# Patient Record
Sex: Female | Born: 1970
Health system: Southern US, Community
[De-identification: ages and names within clinical notes are randomized; demographics above are authoritative.]

## PROBLEM LIST (undated history)

## (undated) DIAGNOSIS — F32A Depression, unspecified: Secondary | ICD-10-CM

## (undated) DIAGNOSIS — E785 Hyperlipidemia, unspecified: Secondary | ICD-10-CM

## (undated) DIAGNOSIS — G4730 Sleep apnea, unspecified: Secondary | ICD-10-CM

## (undated) HISTORY — DX: Hyperlipidemia, unspecified: E78.5

## (undated) HISTORY — DX: Depression, unspecified: F32.A

## (undated) HISTORY — PX: GANGLION CYST EXCISION: SHX1691

## (undated) HISTORY — DX: Sleep apnea, unspecified: G47.30

---

## 2012-08-21 ENCOUNTER — Emergency Department (INDEPENDENT_AMBULATORY_CARE_PROVIDER_SITE_OTHER)
Admission: EM | Admit: 2012-08-21 | Discharge: 2012-08-21 | Disposition: A | Discharge status: Home / Self Care | Payer: Self-pay | Source: Home / Self Care | Attending: Family Medicine | Admitting: Family Medicine

## 2012-08-21 ENCOUNTER — Encounter (HOSPITAL_COMMUNITY): Payer: Self-pay | Admitting: Emergency Medicine

## 2012-08-21 DIAGNOSIS — H01113 Allergic dermatitis of right eye, unspecified eyelid: Secondary | ICD-10-CM

## 2012-08-21 DIAGNOSIS — H01119 Allergic dermatitis of unspecified eye, unspecified eyelid: Secondary | ICD-10-CM

## 2012-08-21 MED ORDER — CETIRIZINE HCL 10 MG PO TABS: 10.0000 mg | ORAL_TABLET | Freq: Every day | ORAL | Status: DC

## 2012-08-21 MED ORDER — KETOTIFEN FUMARATE 0.025 % OP SOLN: 1.0000 [drp] | Freq: Two times a day (BID) | OPHTHALMIC | Status: DC

## 2012-08-21 MED ORDER — ERYTHROMYCIN 5 MG/GM OP OINT: TOPICAL_OINTMENT | Freq: Every day | OPHTHALMIC | Status: DC

## 2012-08-21 MED ORDER — TETRACAINE HCL 0.5 % OP SOLN
OPHTHALMIC | Status: AC
  Filled 2012-08-21: qty 2

## 2012-08-21 NOTE — ED Notes (Signed)
Right eye pain, tearing, swelling.  Reports a sty noticed on Sunday.  Used warm compresses.  Thought to be getting better, but then symptoms worsened.  Woke with increased swelling.  Feels like something under upper lid.

## 2012-08-21 NOTE — ED Provider Notes (Signed)
History     CSN: [id number]  Arrival date & time 08/21/12  1017   First MD Initiated Contact with Patient 08/21/12 1027      Chief Complaint  Patient presents with  . Eye Pain    (Consider location/radiation/quality/duration/timing/severity/associated sxs/prior treatment) Patient is a 42 y.o. female presenting with eye pain.  Eye Pain   This is a 42 year old female who presents with swelling of the right upper eyelid which started yesterday. There is no significant pain but she is having a great care at left hearing. She has not noticed any discharge and she does not describe it matting of her eyelids. There are no significant visual changes other than mild blurring due to the excessive tearing. History reviewed. No pertinent past medical history.  Past Surgical History  Procedure Laterality Date  . Cesarean section      No family history on file.  History  Substance Use Topics  . Smoking status: Current Every Day Smoker  . Smokeless tobacco: Not on file  . Alcohol Use: No    OB History   Grav Para Term Preterm Abortions TAB SAB Ect Mult Living                  Review of Systems  Constitutional: Negative.   HENT: Negative.   Eyes: Positive for itching. Negative for photophobia, pain, discharge and redness.       Swelling of the eyelid  Respiratory: Negative.   Cardiovascular: Negative.   Gastrointestinal: Negative.   Genitourinary: Negative.   Musculoskeletal: Negative.   Skin: Negative.   Neurological: Negative.   Hematological: Negative.   Psychiatric/Behavioral: Negative.     Allergies  Review of patient's allergies indicates no known allergies.  Home Medications   Current Outpatient Rx  Name  Route  Sig  Dispense  Refill  . cetirizine (ZYRTEC ALLERGY) 10 MG tablet   Oral   Take 1 tablet (10 mg total) by mouth daily.   30 tablet   0   . erythromycin ophthalmic ointment   Right Eye   Place into the right eye at bedtime.   3.5 g   0   .  ketotifen (ZADITOR) 0.025 % ophthalmic solution   Right Eye   Place 1 drop into the right eye 2 (two) times daily.   5 mL   0     BP 121/89  Pulse 75  Temp(Src) 98.4 F (36.9 C) (Oral)  Resp 16  SpO2 97%  LMP 07/20/2012  Physical Exam  Constitutional: She is oriented to person, place, and time. She appears well-developed and well-nourished.  Morbidly obese  HENT:  Head: Normocephalic and atraumatic.  Eyes: Pupils are equal, round, and reactive to light.  Edema of the right upper eyelid which is translucent. No obvious localized swelling such as a stye noted. No significant erythema or increase in warmth. No discharge noted. There is mild increase in tearing of the eye and mild erythema of her conjunctiva. Extraocular movements are intact without any pain.  Neck: Normal range of motion. Neck supple.  Cardiovascular: Normal rate and regular rhythm.   Pulmonary/Chest: Effort normal and breath sounds normal.  Abdominal: Soft. Bowel sounds are normal.  Musculoskeletal: Normal range of motion.  Neurological: She is alert and oriented to person, place, and time.  Skin: Skin is warm and dry.  Psychiatric: She has a normal mood and affect. Her behavior is normal.    ED Course  Procedures (including critical care time)  Labs Reviewed -  No data to display No results found.   1. Allergic blepharitis, right       MDM  Zatidor drops, Erythromycin ointment at bedtime and PO Zyrtec. If no improvement seen in 1 wk, follow up with ophthalmologist.         Calvert Cantor, MD 08/21/12 1056

## 2012-08-21 NOTE — ED Notes (Signed)
Patient had many questions related to work note and how to get medicines discussed both issues with dr Alfonse Ras.  Provided work note

## 2012-08-21 NOTE — ED Notes (Signed)
Eye box and tetracaine at bedside 

## 2012-08-21 NOTE — ED Notes (Signed)
Patient does not wear contacts or glasses

## 2013-03-03 ENCOUNTER — Other Ambulatory Visit: Payer: Self-pay

## 2013-03-03 DIAGNOSIS — Z1231 Encounter for screening mammogram for malignant neoplasm of breast: Secondary | ICD-10-CM

## 2013-03-28 ENCOUNTER — Ambulatory Visit
Admission: RE | Admit: 2013-03-28 | Discharge: 2013-03-28 | Disposition: A | Discharge status: Home / Self Care | Payer: Medicaid Other | Source: Ambulatory Visit

## 2013-03-28 DIAGNOSIS — Z1231 Encounter for screening mammogram for malignant neoplasm of breast: Secondary | ICD-10-CM

## 2013-06-06 ENCOUNTER — Encounter: Payer: Self-pay | Admitting: Advanced Practice Midwife

## 2013-06-06 ENCOUNTER — Ambulatory Visit (INDEPENDENT_AMBULATORY_CARE_PROVIDER_SITE_OTHER): Payer: Medicaid Other | Admitting: Advanced Practice Midwife

## 2013-06-06 VITALS — BP 106/51 | HR 80 | Ht 67.0 in | Wt 291.0 lb

## 2013-06-06 DIAGNOSIS — IMO0002 Reserved for concepts with insufficient information to code with codable children: Secondary | ICD-10-CM

## 2013-06-06 DIAGNOSIS — Z6841 Body Mass Index (BMI) 40.0 and over, adult: Secondary | ICD-10-CM

## 2013-06-06 DIAGNOSIS — N920 Excessive and frequent menstruation with regular cycle: Secondary | ICD-10-CM | POA: Insufficient documentation

## 2013-06-06 DIAGNOSIS — F17200 Nicotine dependence, unspecified, uncomplicated: Secondary | ICD-10-CM | POA: Insufficient documentation

## 2013-06-06 DIAGNOSIS — Z0000 Encounter for general adult medical examination without abnormal findings: Secondary | ICD-10-CM

## 2013-06-06 LAB — POCT URINALYSIS DIPSTICK
BILIRUBIN UA: NEGATIVE
Glucose, UA: NEGATIVE
Ketones, UA: NEGATIVE
LEUKOCYTES UA: NEGATIVE
NITRITE UA: NEGATIVE
PH UA: 5
PROTEIN UA: NEGATIVE
RBC UA: NEGATIVE
Spec Grav, UA: 1.02
UROBILINOGEN UA: NEGATIVE

## 2013-06-06 NOTE — Progress Notes (Signed)
Subjective:     Diana Garrison Self is a 43 y.o. female here for a routine exam.  Current complaints: pt states that her menstral cycles are heavy with clots.  Pt is interested in talking about ablation.  Pt states that she will have some pain the first couple days of her cycle.   Personal health questionnaire reviewed: yes.  Patient desires treatment for heavy menstrual bleeding. Denies pain.     Gynecologic History No LMP recorded. Contraception: none Last Pap: 2013. Results were: normal Last mammogram: 2015. Results were: normal  Obstetric History OB History  No data available  G3P2102   The following portions of the patient's history were reviewed and updated as appropriate: allergies, current medications, past family history, past medical history, past social history, past surgical history and problem list.  Review of Systems Pertinent items are noted in HPI.    Objective:    BP 106/51  Pulse 80  Ht 5\' 7"  (1.702 m)  Wt 291 lb (131.997 kg)  BMI 45.57 kg/m2  LMP 05/23/2013  General Appearance:    Alert, cooperative, no distress, appears stated age  Head:    Normocephalic, without obvious abnormality, atraumatic  Eyes:    PERRL, conjunctiva/corneas clear, EOM's intact, fundi    benign, both eyes  Ears:    Normal TM's and external ear canals, both ears  Nose:   Nares normal, septum midline, mucosa normal, no drainage    or sinus tenderness  Throat:   Lips, mucosa, and tongue normal; teeth and gums normal  Neck:   Supple, symmetrical, trachea midline, no adenopathy;    thyroid:  no enlargement/tenderness/nodules; no carotid   bruit or JVD  Back:     Symmetric, no curvature, ROM normal, no CVA tenderness  Lungs:     Clear to auscultation bilaterally, respirations unlabored  Chest Wall:    No tenderness or deformity   Heart:    Regular rate and rhythm, S1 and S2 normal, no murmur, rub   or gallop  Breast Exam:    No tenderness, masses, or nipple abnormality  Abdomen:     Soft,  non-tender, bowel sounds active all four quadrants,    no masses, no organomegaly  Genitalia:    Normal female without lesion, discharge or tenderness  Rectal:    Normal tone, normal prostate, no masses or tenderness;   guaiac negative stool  Extremities:   Extremities normal, atraumatic, no cyanosis or edema  Pulses:   2+ and symmetric all extremities  Skin:   Skin color, texture, turgor normal, no rashes or lesions  Lymph nodes:   Cervical, supraclavicular, and axillary nodes normal  Neurologic:   CNII-XII intact, normal strength, sensation and reflexes    throughout      Assessment:    Healthy female exam.  Menorrhagia High BMI Smoker, 43 yo IUD Candidate for heavy menstrual bleeding   Plan:    Education reviewed: calcium supplements, safe sex/STD prevention, self breast exams, smoking cessation and weight bearing exercise. Contraception: abstinence and condoms. Follow up in: 1 month.   Recommend Mirena IUD for symptom management. Educated patient on insertion, risk and benefits. Patient agrees to plan of care. Will reschedule when on her MP for insertion. Encouraged abstinence or condom use prior to insertion. Encouraged patient to stop smoking and pick quit date. Requested IUD handout be given.  40 min spent with patient greater than 80% spent in counseling and coordination of care.  Deontrae Drinkard Wilson SingerWren CNM

## 2013-06-07 LAB — GC/CHLAMYDIA PROBE AMP
CT Probe RNA: NEGATIVE
GC PROBE AMP APTIMA: NEGATIVE

## 2013-06-09 LAB — PAP IG W/ RFLX HPV ASCU

## 2013-08-01 ENCOUNTER — Other Ambulatory Visit: Payer: Self-pay | Admitting: Internal Medicine

## 2013-08-01 DIAGNOSIS — M5430 Sciatica, unspecified side: Secondary | ICD-10-CM

## 2013-08-11 ENCOUNTER — Ambulatory Visit
Admission: RE | Admit: 2013-08-11 | Discharge: 2013-08-11 | Disposition: A | Discharge status: Home / Self Care | Payer: Medicaid Other | Source: Ambulatory Visit | Attending: Internal Medicine | Admitting: Internal Medicine

## 2013-08-11 DIAGNOSIS — M5430 Sciatica, unspecified side: Secondary | ICD-10-CM

## 2013-10-31 ENCOUNTER — Emergency Department (HOSPITAL_COMMUNITY)
Admission: EM | Admit: 2013-10-31 | Discharge: 2013-10-31 | Disposition: A | Discharge status: Home / Self Care | Payer: Medicaid Other | Source: Home / Self Care

## 2013-10-31 ENCOUNTER — Encounter (HOSPITAL_COMMUNITY): Payer: Self-pay | Admitting: Emergency Medicine

## 2013-10-31 DIAGNOSIS — L03221 Cellulitis of neck: Principal | ICD-10-CM

## 2013-10-31 DIAGNOSIS — L0211 Cutaneous abscess of neck: Secondary | ICD-10-CM

## 2013-10-31 MED ORDER — MINOCYCLINE HCL 100 MG PO CAPS: 100.0000 mg | ORAL_CAPSULE | Freq: Two times a day (BID) | ORAL | Status: DC

## 2013-10-31 MED ORDER — MUPIROCIN CALCIUM 2 % EX CREA: 1.0000 "application " | TOPICAL_CREAM | Freq: Three times a day (TID) | CUTANEOUS | Status: DC

## 2013-10-31 NOTE — Discharge Instructions (Signed)
Warm compress twice a day when you take the antibiotic, take all of medicine, return as needed. °

## 2013-10-31 NOTE — ED Notes (Signed)
Pt  Has  Some  Swelling  Redness  And  Tenderness  Below  Her chin      -  She  States  It  Started  Out  As  An ingrown  Hair     About  3  Days  Ago         She pulled the  Hair  And  It  Became  Worse      She  Reports  She  Has  Been applying  Warm compresses to the  Area  And  Symptoms  Are  Worse

## 2013-10-31 NOTE — ED Provider Notes (Signed)
CSN: [id number]     Arrival date & time 10/31/13  1111 History   None    Chief Complaint  Patient presents with  . Abscess   (Consider location/radiation/quality/duration/timing/severity/associated sxs/prior Treatment) Patient is a 43 y.o. female presenting with abscess. The history is provided by the patient.  Abscess Location:  Head/neck Head/neck abscess location:  R neck Size:  1.5 cm Abscess quality: induration, painful and warmth   Red streaking: no   Duration:  3 days Progression:  Worsening Pain details:    Quality:  Pressure   Severity:  Mild Chronicity:  New Context: skin injury   Context comment:  Became worse after pulling chin hair. Associated symptoms: no fever   Risk factors: no prior abscess     No past medical history on file. Past Surgical History  Procedure Laterality Date  . Cesarean section    . Cyst removal hand     History reviewed. No pertinent family history. History  Substance Use Topics  . Smoking status: Current Every Day Smoker  . Smokeless tobacco: Not on file  . Alcohol Use: No   OB History   Grav Para Term Preterm Abortions TAB SAB Ect Mult Living   3 3 2 1  0 0 0 0 0 2     Review of Systems  Constitutional: Negative for fever.  Musculoskeletal: Negative.   Skin: Positive for wound.    Allergies  Review of patient's allergies indicates no known allergies.  Home Medications   Prior to Admission medications   Medication Sig Start Date End Date Taking? Authorizing Provider  cetirizine (ZYRTEC ALLERGY) 10 MG tablet Take 1 tablet (10 mg total) by mouth daily. 08/21/12   Calvert CantorSaima Rizwan, MD  erythromycin ophthalmic ointment Place into the right eye at bedtime. 08/21/12   Calvert CantorSaima Rizwan, MD  ketotifen (ZADITOR) 0.025 % ophthalmic solution Place 1 drop into the right eye 2 (two) times daily. 08/21/12   Calvert CantorSaima Rizwan, MD  minocycline (MINOCIN,DYNACIN) 100 MG capsule Take 1 capsule (100 mg total) by mouth 2 (two) times daily. 10/31/13   Linna HoffJames D  Kindl, MD  mupirocin cream (BACTROBAN) 2 % Apply 1 application topically 3 (three) times daily. 10/31/13   Linna HoffJames D Kindl, MD   BP 122/77  Pulse 89  Temp(Src) 98.1 F (36.7 C) (Oral)  Resp 18  SpO2 99%  LMP 10/18/2013 Physical Exam  Nursing note and vitals reviewed. Constitutional: She is oriented to person, place, and time. She appears well-developed and well-nourished. No distress.  Neurological: She is alert and oriented to person, place, and time.  Skin: Skin is warm and dry. There is erythema.  Indurated area with central crust, no drainage to ant neck., no erythema.    ED Course  Procedures (including critical care time) Labs Review Labs Reviewed - No data to display  Imaging Review No results found.   MDM   1. Cellulitis and abscess of neck        Linna HoffJames D Kindl, MD 11/01/13 1640

## 2014-01-12 ENCOUNTER — Encounter (HOSPITAL_COMMUNITY): Payer: Self-pay | Admitting: Emergency Medicine

## 2014-09-07 ENCOUNTER — Ambulatory Visit: Payer: Medicaid Other | Attending: Internal Medicine

## 2014-10-04 ENCOUNTER — Emergency Department (INDEPENDENT_AMBULATORY_CARE_PROVIDER_SITE_OTHER)
Admission: EM | Admit: 2014-10-04 | Discharge: 2014-10-04 | Disposition: A | Discharge status: Home / Self Care | Payer: Self-pay | Source: Home / Self Care | Attending: Emergency Medicine | Admitting: Emergency Medicine

## 2014-10-04 ENCOUNTER — Encounter (HOSPITAL_COMMUNITY): Payer: Self-pay | Admitting: *Deleted

## 2014-10-04 DIAGNOSIS — K130 Diseases of lips: Secondary | ICD-10-CM

## 2014-10-04 MED ORDER — ACYCLOVIR 400 MG PO TABS: 800.0000 mg | ORAL_TABLET | Freq: Three times a day (TID) | ORAL | Status: DC

## 2014-10-04 NOTE — ED Notes (Signed)
Had faint soreness to inner right upper lip yesterday.  Woke this AM with swelling, redness, and pain to lip.  Tiny lesions noted to lip border.

## 2014-10-04 NOTE — Discharge Instructions (Signed)
This is a bug bite or fever blister. Apply ice as often as you can to help with the swelling. You can use a small amount of over-the-counter hydrocortisone cream to the outer lip twice a day. Take acyclovir 2 tablets 3 times a day for 2 days. With the coupon, this should be between $15 and $20 at [facility] ([place]). Follow-up as needed.

## 2014-10-04 NOTE — ED Provider Notes (Signed)
CSN: [id number]     Arrival date & time 10/04/14  1300 History   First MD Initiated Contact with Patient 10/04/14 1313     Chief Complaint  Patient presents with  . Oral Swelling   (Consider location/radiation/quality/duration/timing/severity/associated sxs/prior Treatment) HPI She is a 44 year old woman here for evaluation of lip swelling. She states that yesterday she had a tingling sensation in her right upper lip. This was after biting her lip. This morning, when she woke up, it was swollen and there was a blister at the [place] border. She denies any tongue swelling. No fevers or chills. No difficulty breathing or swallowing.  She does report a bug bite that looked similar on her hand a few days ago.  History reviewed. No pertinent past medical history. Past Surgical History  Procedure Laterality Date  . Cesarean section    . Ganglion cyst excision      x2   No family history on file. History  Substance Use Topics  . Smoking status: Current Every Day Smoker  . Smokeless tobacco: Not on file  . Alcohol Use: Yes     Comment: occasional   OB History    Gravida Para Term Preterm AB TAB SAB Ectopic Multiple Living   0 0 0 0 0 2     Review of Systems As in history of present illness Allergies  Review of patient's allergies indicates no known allergies.  Home Medications   Prior to Admission medications   Medication Sig Start Date End Date Taking? Authorizing Provider  ACETAMINOPHEN PO Take by mouth. Prn for leg pain   Yes Historical Provider, MD  acyclovir (ZOVIRAX) 400 MG tablet Take 2 tablets (800 mg total) by mouth 3 (three) times daily. For 2 days 10/04/14   Charm Rings, MD   BP 109/54 mmHg  Pulse 96  Temp(Src) 98.5 F (36.9 C) (Oral)  Resp 22  SpO2 99%  LMP 08/24/2014 (Exact Date) Physical Exam  Constitutional: She is oriented to person, place, and time. She appears well-developed and well-nourished. No distress.  HENT:  Mouth/Throat: Oropharynx is  clear and moist and mucous membranes are normal. No oropharyngeal exudate or posterior oropharyngeal edema.    Cardiovascular: Normal rate.   Neurological: She is alert and oriented to person, place, and time.    ED Course  Procedures (including critical care time) Labs Review Labs Reviewed - No data to display  Imaging Review No results found.   MDM   1. Lip lesion    Bug bite versus herpes. Symptomatic treatment with ice and small amount of hydrocortisone cream Prescription for acyclovir given as well. Follow-up as needed.    Charm Rings, MD 10/04/14 [phone]

## 2015-04-26 ENCOUNTER — Other Ambulatory Visit: Payer: Self-pay

## 2015-04-26 DIAGNOSIS — Z1231 Encounter for screening mammogram for malignant neoplasm of breast: Secondary | ICD-10-CM

## 2015-05-06 ENCOUNTER — Ambulatory Visit: Payer: Self-pay

## 2015-06-15 ENCOUNTER — Ambulatory Visit
Admission: RE | Admit: 2015-06-15 | Discharge: 2015-06-15 | Disposition: A | Discharge status: Home / Self Care | Payer: 59 | Source: Ambulatory Visit

## 2015-06-15 DIAGNOSIS — Z1231 Encounter for screening mammogram for malignant neoplasm of breast: Secondary | ICD-10-CM

## 2015-06-17 ENCOUNTER — Other Ambulatory Visit: Payer: Self-pay | Admitting: Family Medicine

## 2015-06-17 ENCOUNTER — Other Ambulatory Visit (HOSPITAL_COMMUNITY)
Admission: RE | Admit: 2015-06-17 | Discharge: 2015-06-17 | Disposition: A | Discharge status: Home / Self Care | Payer: 59 | Source: Ambulatory Visit | Attending: Family Medicine | Admitting: Family Medicine

## 2015-06-17 DIAGNOSIS — Z1151 Encounter for screening for human papillomavirus (HPV): Secondary | ICD-10-CM | POA: Diagnosis present

## 2015-06-17 DIAGNOSIS — Z01419 Encounter for gynecological examination (general) (routine) without abnormal findings: Secondary | ICD-10-CM | POA: Diagnosis present

## 2015-06-23 LAB — CYTOLOGY - PAP

## 2016-04-06 DIAGNOSIS — Z8619 Personal history of other infectious and parasitic diseases: Secondary | ICD-10-CM | POA: Insufficient documentation

## 2016-04-20 ENCOUNTER — Other Ambulatory Visit: Payer: Self-pay | Admitting: Nurse Practitioner

## 2016-04-20 DIAGNOSIS — Z1231 Encounter for screening mammogram for malignant neoplasm of breast: Secondary | ICD-10-CM

## 2016-06-16 ENCOUNTER — Ambulatory Visit: Payer: 59

## 2016-07-26 ENCOUNTER — Ambulatory Visit: Payer: 59

## 2016-09-04 ENCOUNTER — Encounter: Payer: Self-pay | Admitting: Physician Assistant

## 2016-09-04 ENCOUNTER — Ambulatory Visit (INDEPENDENT_AMBULATORY_CARE_PROVIDER_SITE_OTHER): Payer: BLUE CROSS/BLUE SHIELD | Admitting: Physician Assistant

## 2016-09-04 VITALS — BP 127/86 | HR 80 | Temp 98.9°F | Resp 18 | Ht 67.0 in | Wt 275.4 lb

## 2016-09-04 DIAGNOSIS — Z130 Encounter for screening for diseases of the blood and blood-forming organs and certain disorders involving the immune mechanism: Secondary | ICD-10-CM

## 2016-09-04 DIAGNOSIS — Z113 Encounter for screening for infections with a predominantly sexual mode of transmission: Secondary | ICD-10-CM | POA: Diagnosis not present

## 2016-09-04 DIAGNOSIS — Z1159 Encounter for screening for other viral diseases: Secondary | ICD-10-CM

## 2016-09-04 DIAGNOSIS — Z23 Encounter for immunization: Secondary | ICD-10-CM | POA: Diagnosis not present

## 2016-09-04 DIAGNOSIS — Z13228 Encounter for screening for other metabolic disorders: Secondary | ICD-10-CM

## 2016-09-04 DIAGNOSIS — R875 Abnormal microbiological findings in specimens from female genital organs: Secondary | ICD-10-CM | POA: Diagnosis not present

## 2016-09-04 DIAGNOSIS — Z01419 Encounter for gynecological examination (general) (routine) without abnormal findings: Secondary | ICD-10-CM

## 2016-09-04 DIAGNOSIS — A5901 Trichomonal vulvovaginitis: Secondary | ICD-10-CM

## 2016-09-04 DIAGNOSIS — I8390 Asymptomatic varicose veins of unspecified lower extremity: Secondary | ICD-10-CM

## 2016-09-04 DIAGNOSIS — G8929 Other chronic pain: Secondary | ICD-10-CM | POA: Diagnosis not present

## 2016-09-04 DIAGNOSIS — M545 Low back pain, unspecified: Secondary | ICD-10-CM

## 2016-09-04 DIAGNOSIS — Z13220 Encounter for screening for lipoid disorders: Secondary | ICD-10-CM | POA: Diagnosis not present

## 2016-09-04 DIAGNOSIS — Z114 Encounter for screening for human immunodeficiency virus [HIV]: Secondary | ICD-10-CM

## 2016-09-04 DIAGNOSIS — Z0000 Encounter for general adult medical examination without abnormal findings: Secondary | ICD-10-CM

## 2016-09-04 DIAGNOSIS — M549 Dorsalgia, unspecified: Secondary | ICD-10-CM | POA: Insufficient documentation

## 2016-09-04 NOTE — Patient Instructions (Addendum)
I will contact you with your lab results as soon as they are available.   If you have not heard from me in 2 weeks, please contact me.  The fastest way to get your results is to register for My Chart (see the instructions on the last page of this printout).  For your back pain - motrin (ibuprofen or advil ) take 4 - '200mg'$  pills 3x/day or Aleve 2 -- 220,g pills 2x/day but please do not take both.  IF you received an x-ray today, you will receive an invoice from [facility]. Please contact [facility] at [phone] with questions or concerns regarding your invoice.   IF you received labwork today, you will receive an invoice from [facility]. Please contact [facility] at [phone] with questions or concerns regarding your invoice.   Our billing staff will not be able to assist you with questions regarding bills from these companies.  You will be contacted with the lab results as soon as they are available. The fastest way to get your results is to activate your My Chart account. Instructions are located on the last page of this paperwork. If you have not heard from [place] regarding the results in 2 weeks, please contact this office.    Health Maintenance, Female Adopting a healthy lifestyle and getting preventive care can go a long way to promote health and wellness. Talk with your health care provider about what schedule of regular examinations is right for you. This is a good chance for you to check in with your provider about disease prevention and staying healthy. In between checkups, there are plenty of things you can do on your own. Experts have done a lot of research about which lifestyle changes and preventive measures are most likely to keep you healthy. Ask your health care provider for more information. Weight and diet Eat a healthy diet  Be sure to include plenty of vegetables, fruits, low-fat dairy products, and lean protein.  Do not eat a lot of foods high in solid  fats, added sugars, or salt.  Get regular exercise. This is one of the most important things you can do for your health. ? Most adults should exercise for at least 150 minutes each week. The exercise should increase your heart rate and make you sweat (moderate-intensity exercise). ? Most adults should also do strengthening exercises at least twice a week. This is in addition to the moderate-intensity exercise.  Maintain a healthy weight  Body mass index (BMI) is a measurement that can be used to identify possible weight problems. It estimates body fat based on height and weight. Your health care provider can help determine your BMI and help you achieve or maintain a healthy weight.  For females 35 years of age and older: ? A BMI below 18.5 is considered underweight. ? A BMI of 18.5 to 24.9 is normal. ? A BMI of 25 to 29.9 is considered overweight. ? A BMI of 30 and above is considered obese.  Watch levels of cholesterol and blood lipids  You should start having your blood tested for lipids and cholesterol at 46 years of age, then have this test every 5 years.  You may need to have your cholesterol levels checked more often if: ? Your lipid or cholesterol levels are high. ? You are older than 46 years of age. ? You are at high risk for heart disease.  Cancer screening Lung Cancer  Lung cancer screening is recommended for adults 32-24 years old who  are at high risk for lung cancer because of a history of smoking.  A yearly low-dose CT scan of the lungs is recommended for people who: ? Currently smoke. ? Have quit within the past 15 years. ? Have at least a 30-pack-year history of smoking. A pack year is smoking an average of one pack of cigarettes a day for 1 year.  Yearly screening should continue until it has been 15 years since you quit.  Yearly screening should stop if you develop a health problem that would prevent you from having lung cancer treatment.  Breast  Cancer  Practice breast self-awareness. This means understanding how your breasts normally appear and feel.  It also means doing regular breast self-exams. Let your health care provider know about any changes, no matter how small.  If you are in your 20s or 30s, you should have a clinical breast exam (CBE) by a health care provider every 1-3 years as part of a regular health exam.  If you are 77 or older, have a CBE every year. Also consider having a breast X-ray (mammogram) every year.  If you have a family history of breast cancer, talk to your health care provider about genetic screening.  If you are at high risk for breast cancer, talk to your health care provider about having an MRI and a mammogram every year.  Breast cancer gene (BRCA) assessment is recommended for women who have family members with BRCA-related cancers. BRCA-related cancers include: ? Breast. ? Ovarian. ? Tubal. ? Peritoneal cancers.  Results of the assessment will determine the need for genetic counseling and BRCA1 and BRCA2 testing.  Cervical Cancer Your health care provider may recommend that you be screened regularly for cancer of the pelvic organs (ovaries, uterus, and vagina). This screening involves a pelvic examination, including checking for microscopic changes to the surface of your cervix (Pap test). You may be encouraged to have this screening done every 3 years, beginning at age 34.  For women ages 20-65, health care providers may recommend pelvic exams and Pap testing every 3 years, or they may recommend the Pap and pelvic exam, combined with testing for human papilloma virus (HPV), every 5 years. Some types of HPV increase your risk of cervical cancer. Testing for HPV may also be done on women of any age with unclear Pap test results.  Other health care providers may not recommend any screening for nonpregnant women who are considered low risk for pelvic cancer and who do not have symptoms. Ask your  health care provider if a screening pelvic exam is right for you.  If you have had past treatment for cervical cancer or a condition that could lead to cancer, you need Pap tests and screening for cancer for at least 20 years after your treatment. If Pap tests have been discontinued, your risk factors (such as having a new sexual partner) need to be reassessed to determine if screening should resume. Some women have medical problems that increase the chance of getting cervical cancer. In these cases, your health care provider may recommend more frequent screening and Pap tests.  Colorectal Cancer  This type of cancer can be detected and often prevented.  Routine colorectal cancer screening usually begins at 46 years of age and continues through 46 years of age.  Your health care provider may recommend screening at an earlier age if you have risk factors for colon cancer.  Your health care provider may also recommend using home test kits to  check for hidden blood in the stool.  A small camera at the end of a tube can be used to examine your colon directly (sigmoidoscopy or colonoscopy). This is done to check for the earliest forms of colorectal cancer.  Routine screening usually begins at age 89.  Direct examination of the colon should be repeated every 5-10 years through 46 years of age. However, you may need to be screened more often if early forms of precancerous polyps or small growths are found.  Skin Cancer  Check your skin from head to toe regularly.  Tell your health care provider about any new moles or changes in moles, especially if there is a change in a mole's shape or color.  Also tell your health care provider if you have a mole that is larger than the size of a pencil eraser.  Always use sunscreen. Apply sunscreen liberally and repeatedly throughout the day.  Protect yourself by wearing long sleeves, pants, a wide-brimmed hat, and sunglasses whenever you are  outside.  Heart disease, diabetes, and high blood pressure  High blood pressure causes heart disease and increases the risk of stroke. High blood pressure is more likely to develop in: ? People who have blood pressure in the high end of the normal range (130-139/85-89 mm Hg). ? People who are overweight or obese. ? People who are African American.  If you are 2-68 years of age, have your blood pressure checked every 3-5 years. If you are 2 years of age or older, have your blood pressure checked every year. You should have your blood pressure measured twice-once when you are at a hospital or clinic, and once when you are not at a hospital or clinic. Record the average of the two measurements. To check your blood pressure when you are not at a hospital or clinic, you can use: ? An automated blood pressure machine at a pharmacy. ? A home blood pressure monitor.  If you are between 77 years and 24 years old, ask your health care provider if you should take aspirin to prevent strokes.  Have regular diabetes screenings. This involves taking a blood sample to check your fasting blood sugar level. ? If you are at a normal weight and have a low risk for diabetes, have this test once every three years after 46 years of age. ? If you are overweight and have a high risk for diabetes, consider being tested at a younger age or more often. Preventing infection Hepatitis B  If you have a higher risk for hepatitis B, you should be screened for this virus. You are considered at high risk for hepatitis B if: ? You were born in a country where hepatitis B is common. Ask your health care provider which countries are considered high risk. ? Your parents were born in a high-risk country, and you have not been immunized against hepatitis B (hepatitis B vaccine). ? You have HIV or AIDS. ? You use needles to inject street drugs. ? You live with someone who has hepatitis B. ? You have had sex with someone who has  hepatitis B. ? You get hemodialysis treatment. ? You take certain medicines for conditions, including cancer, organ transplantation, and autoimmune conditions.  Hepatitis C  Blood testing is recommended for: ? Everyone born from [birth year] through 1965. ? Anyone with known risk factors for hepatitis C.  Sexually transmitted infections (STIs)  You should be screened for sexually transmitted infections (STIs) including gonorrhea and chlamydia if: ? You  are sexually active and are younger than 46 years of age. ? You are older than 46 years of age and your health care provider tells you that you are at risk for this type of infection. ? Your sexual activity has changed since you were last screened and you are at an increased risk for chlamydia or gonorrhea. Ask your health care provider if you are at risk.  If you do not have HIV, but are at risk, it may be recommended that you take a prescription medicine daily to prevent HIV infection. This is called pre-exposure prophylaxis (PrEP). You are considered at risk if: ? You are sexually active and do not regularly use condoms or know the HIV status of your partner(s). ? You take drugs by injection. ? You are sexually active with a partner who has HIV.  Talk with your health care provider about whether you are at high risk of being infected with HIV. If you choose to begin PrEP, you should first be tested for HIV. You should then be tested every 3 months for as long as you are taking PrEP. Pregnancy  If you are premenopausal and you may become pregnant, ask your health care provider about preconception counseling.  If you may become pregnant, take 400 to 800 micrograms (mcg) of folic acid every day.  If you want to prevent pregnancy, talk to your health care provider about birth control (contraception). Osteoporosis and menopause  Osteoporosis is a disease in which the bones lose minerals and strength with aging. This can result in serious bone  fractures. Your risk for osteoporosis can be identified using a bone density scan.  If you are 26 years of age or older, or if you are at risk for osteoporosis and fractures, ask your health care provider if you should be screened.  Ask your health care provider whether you should take a calcium or vitamin D supplement to lower your risk for osteoporosis.  Menopause may have certain physical symptoms and risks.  Hormone replacement therapy may reduce some of these symptoms and risks. Talk to your health care provider about whether hormone replacement therapy is right for you. Follow these instructions at home:  Schedule regular health, dental, and eye exams.  Stay current with your immunizations.  Do not use any tobacco products including cigarettes, chewing tobacco, or electronic cigarettes.  If you are pregnant, do not drink alcohol.  If you are breastfeeding, limit how much and how often you drink alcohol.  Limit alcohol intake to no more than 1 drink per day for nonpregnant women. One drink equals 12 ounces of beer, 5 ounces of wine, or 1 ounces of hard liquor.  Do not use street drugs.  Do not share needles.  Ask your health care provider for help if you need support or information about quitting drugs.  Tell your health care provider if you often feel depressed.  Tell your health care provider if you have ever been abused or do not feel safe at home. This information is not intended to replace advice given to you by your health care provider. Make sure you discuss any questions you have with your health care provider. Document Released: 09/12/2010 Document Revised: 08/05/2015 Document Reviewed: 12/01/2014 Elsevier Interactive Patient Education  [employer].

## 2016-09-04 NOTE — Progress Notes (Signed)
Diana Garrison  MRN: [id number] DOB: 06-14-1970  PCP: Patient, No Pcp Per  Subjective:  Pt presents to clinic for a CPE. She is doing well but she would like to have STD testing as she was in a relationship where her partner stepped out and she wants to make sure she has not gotten any infections.  Last sexual encounter was about 1 month ago.  She has chronic back pain that she is on gabapentin for - she has never had imaging.  She works as a Quarry manager.  Pts has varicose veins in the past she has seen a specialist who suggested treatment but at the time she was not able to do it.  Her legs ache at night - she has tried compression socks and they have felt better on the nights that she has worn them.  She does not noticed swelling in the evenings.  Last dental exam: last year Last vision exam: a couple months ago Last pap: 2 years ago - normal Last mammo: year ago - all normal  Typical meals for patient: none to 2 meals - sometimes snacks - eats at work Typical beverage choices: water Exercises: none outside of work Sleeps: sleeping well 7-8 hrs per night  Patient Active Problem List   Diagnosis Date Noted  . Back pain 09/04/2016  . Menorrhagia 06/06/2013  . High BMI 06/06/2013  . Smoker 06/06/2013    Review of Systems  Constitutional: Negative.   HENT: Negative.   Eyes: Negative.   Respiratory: Negative.   Cardiovascular: Negative.   Gastrointestinal: Negative.   Endocrine: Negative.   Genitourinary: Positive for menstrual problem (heavy and cramping - typical for her).  Musculoskeletal: Positive for back pain.  Skin: Negative.   Allergic/Immunologic: Negative.   Neurological: Negative.   Hematological: Negative.   Psychiatric/Behavioral: Negative.      Current Outpatient Prescriptions on File Prior to Visit  Medication Sig Dispense Refill  . [DISCONTINUED] cetirizine (ZYRTEC ALLERGY) 10 MG tablet Take 1 tablet (10 mg total) by mouth daily. 30 tablet 0   No current  facility-administered medications on file prior to visit.     No Known Allergies  Social History   Social History  . Marital status: Single    Spouse name: N/A  . Number of children: N/A  . Years of education: N/A   Occupational History  . CNA     [employer]   Social History Main Topics  . Smoking status: Current Every Day Smoker    Packs/day: 0.50    Years: 7.00  . Smokeless tobacco: Never Used  . Alcohol use Yes     Comment: 4 drinks a week  . Drug use: No  . Sexual activity: Yes    Partners: Male    Birth control/ protection: None, Condom     Comment: 10/04/14: Not sexually active x5 mo due to spouse having had a CVA   Other Topics Concern  . None   Social History Narrative   Lives with daughter       Seatbelt - sometimes      Guns in home - no    Past Surgical History:  Procedure Laterality Date  . CESAREAN SECTION    . GANGLION CYST EXCISION     x2    Family History  Problem Relation Age of Onset  . Cirrhosis Father   . Pulmonary embolism Sister      Objective:  BP 127/86   Pulse 80   Temp 98.9 F (  37.2 C) (Oral)   Resp 18   Ht '5\' 7"'$  (1.702 m)   Wt 275 lb 6.4 oz (124.9 kg)   LMP 08/22/2016   SpO2 99%   BMI 43.13 kg/m   Physical Exam  Constitutional: She is oriented to person, place, and time and well-developed, well-nourished, and in no distress.  HENT:  Head: Normocephalic and atraumatic.  Right Ear: Hearing, tympanic membrane, external ear and ear canal normal.  Left Ear: Hearing, tympanic membrane, external ear and ear canal normal.  Nose: Nose normal.  Mouth/Throat: Uvula is midline, oropharynx is clear and moist and mucous membranes are normal.  Eyes: Conjunctivae and EOM are normal. Pupils are equal, round, and reactive to light.  Neck: Trachea normal and normal range of motion. Neck supple. No thyroid mass and no thyromegaly present.  Cardiovascular: Normal rate, regular rhythm and normal heart sounds.   No murmur  heard. Pulmonary/Chest: Effort normal and breath sounds normal. She has no wheezes. Right breast exhibits no inverted nipple, no mass, no nipple discharge, no skin change and no tenderness. Left breast exhibits no inverted nipple, no mass, no nipple discharge, no skin change and no tenderness. Breasts are symmetrical.  Abdominal: Soft. Bowel sounds are normal. There is no tenderness.  Genitourinary: Vagina normal, uterus normal, cervix normal, right adnexa normal, left adnexa normal and vulva normal.  Genitourinary Comments: Difficult bimanual exam due to size  Musculoskeletal: Normal range of motion.  Bilateral varicose veins lower legs - worse on the left  Lymphadenopathy:    She has no cervical adenopathy.  Neurological: She is alert and oriented to person, place, and time. She has normal motor skills, normal sensation, normal strength and normal reflexes. Gait normal.  Skin: Skin is warm and dry.  Psychiatric: Mood, memory, affect and judgment normal.    Wt Readings from Last 3 Encounters:  09/04/16 275 lb 6.4 oz (124.9 kg)  06/06/13 291 lb (132 kg)     Visual Acuity Screening   Right eye Left eye Both eyes  Without correction: '20/20 20/20 20/20 '$  With correction:       Assessment and Plan :  Annual physical exam - anticipatory guidance  Chronic low back pain without sciatica, unspecified back pain laterality - this is long standing and patient will RTC for further evaluation  Screening, lipid - Plan: Lipid panel - check labs  Screening for metabolic disorder - Plan: CMP14+EGFR - check labs  Screening for deficiency anemia - Plan: CBC with Differential/Platelet - check labs  Screening for HIV (human immunodeficiency virus) - Plan: HIV antibody  Need for hepatitis B screening test - Plan: Hepatitis B surface antigen  Need for hepatitis C screening test - Plan: HCV Antibody RFX to Quant PCR  Screen for STD (sexually transmitted disease) - Plan: Trichomonas vaginalis, RNA,  RPR  Encounter for gynecological examination without abnormal finding - Plan: Pap IG, CT/NG NAA, and HPV (high risk)  Varicose vein of leg - Plan: Ambulatory referral to Vascular Surgery - encouraged pt to use support socks which she has used in the past.  Need for Tdap vaccination - Plan: Tdap vaccine greater than or equal to 7yo IM  Windell Hummingbird PA-C  [facility] 09/05/2016 8:01 AM

## 2016-09-05 LAB — CBC WITH DIFFERENTIAL/PLATELET
BASOS ABS: 0 10*3/uL (ref 0.0–0.2)
Basos: 0 %
EOS (ABSOLUTE): 0.2 10*3/uL (ref 0.0–0.4)
Eos: 2 %
Hematocrit: 35.5 % (ref 34.0–46.6)
Hemoglobin: 11 g/dL — ABNORMAL LOW (ref 11.1–15.9)
IMMATURE GRANS (ABS): 0 10*3/uL (ref 0.0–0.1)
Immature Granulocytes: 0 %
LYMPHS: 24 %
Lymphocytes Absolute: 2.6 10*3/uL (ref 0.7–3.1)
MCH: 27.6 pg (ref 26.6–33.0)
MCHC: 31 g/dL — AB (ref 31.5–35.7)
MCV: 89 fL (ref 79–97)
Monocytes Absolute: 0.8 10*3/uL (ref 0.1–0.9)
Monocytes: 7 %
Neutrophils Absolute: 7.1 10*3/uL — ABNORMAL HIGH (ref 1.4–7.0)
Neutrophils: 67 %
PLATELETS: 451 10*3/uL — AB (ref 150–379)
RBC: 3.98 x10E6/uL (ref 3.77–5.28)
RDW: 17.9 % — ABNORMAL HIGH (ref 12.3–15.4)
WBC: 10.7 10*3/uL (ref 3.4–10.8)

## 2016-09-05 LAB — HCV AB W REFLEX TO QUANT PCR: HCV AB: 0.1 {s_co_ratio} (ref 0.0–0.9)

## 2016-09-05 LAB — CMP14+EGFR
A/G RATIO: 1.7 (ref 1.2–2.2)
ALK PHOS: 78 IU/L (ref 39–117)
ALT: 12 IU/L (ref 0–32)
AST: 16 IU/L (ref 0–40)
Albumin: 3.9 g/dL (ref 3.5–5.5)
BILIRUBIN TOTAL: 0.2 mg/dL (ref 0.0–1.2)
BUN/Creatinine Ratio: 14 (ref 9–23)
BUN: 10 mg/dL (ref 6–24)
CHLORIDE: 105 mmol/L (ref 96–106)
CO2: 21 mmol/L (ref 20–29)
Calcium: 9.2 mg/dL (ref 8.7–10.2)
Creatinine, Ser: 0.7 mg/dL (ref 0.57–1.00)
GFR calc Af Amer: 121 mL/min/{1.73_m2} (ref >59)
GFR calc non Af Amer: 105 mL/min/{1.73_m2} (ref >59)
GLUCOSE: 92 mg/dL (ref 65–99)
Globulin, Total: 2.3 g/dL (ref 1.5–4.5)
POTASSIUM: 4.4 mmol/L (ref 3.5–5.2)
Sodium: 137 mmol/L (ref 134–144)
Total Protein: 6.2 g/dL (ref 6.0–8.5)

## 2016-09-05 LAB — LIPID PANEL
CHOL/HDL RATIO: 5.5 ratio — AB (ref 0.0–4.4)
Cholesterol, Total: 219 mg/dL — ABNORMAL HIGH (ref 100–199)
HDL: 40 mg/dL (ref >39)
LDL CALC: 145 mg/dL — AB (ref 0–99)
Triglycerides: 170 mg/dL — ABNORMAL HIGH (ref 0–149)
VLDL CHOLESTEROL CAL: 34 mg/dL (ref 5–40)

## 2016-09-05 LAB — HCV INTERPRETATION

## 2016-09-05 LAB — HIV ANTIBODY (ROUTINE TESTING W REFLEX): HIV SCREEN 4TH GENERATION: NONREACTIVE

## 2016-09-05 LAB — RPR: RPR Ser Ql: NONREACTIVE

## 2016-09-05 LAB — HEPATITIS B SURFACE ANTIGEN: Hepatitis B Surface Ag: NEGATIVE

## 2016-09-08 LAB — TRICHOMONAS VAGINALIS, PROBE AMP: TRICH VAG BY NAA: POSITIVE — AB

## 2016-09-11 ENCOUNTER — Encounter: Payer: Self-pay | Admitting: Physician Assistant

## 2016-09-11 LAB — PAP IG, CT-NG NAA, HPV HIGH-RISK
Chlamydia, Nuc. Acid Amp: NEGATIVE
Gonococcus by Nucleic Acid Amp: NEGATIVE
HPV, HIGH-RISK: NEGATIVE
PAP Smear Comment: 0

## 2016-09-12 MED ORDER — METRONIDAZOLE 500 MG PO TABS: 2000.0000 mg | ORAL_TABLET | Freq: Once | ORAL | 0 refills | Status: AC

## 2016-09-12 NOTE — Addendum Note (Signed)
Addended by: Morrell RiddleWEBER, SARAH L on: 09/12/2016 05:49 AM   Modules accepted: Orders

## 2016-09-13 ENCOUNTER — Encounter: Payer: Self-pay | Admitting: Physician Assistant

## 2016-09-14 ENCOUNTER — Encounter: Payer: Self-pay | Admitting: Physician Assistant

## 2016-09-15 ENCOUNTER — Ambulatory Visit
Admission: RE | Admit: 2016-09-15 | Discharge: 2016-09-15 | Disposition: A | Discharge status: Home / Self Care | Payer: BLUE CROSS/BLUE SHIELD | Source: Ambulatory Visit | Attending: Nurse Practitioner | Admitting: Nurse Practitioner

## 2016-09-15 ENCOUNTER — Other Ambulatory Visit: Payer: Self-pay | Admitting: Physician Assistant

## 2016-09-15 DIAGNOSIS — Z1231 Encounter for screening mammogram for malignant neoplasm of breast: Secondary | ICD-10-CM

## 2016-09-15 MED ORDER — FISH OIL 1000 MG PO CAPS: 1000.0000 mg | ORAL_CAPSULE | Freq: Every day | ORAL | 4 refills | Status: DC

## 2016-09-21 ENCOUNTER — Other Ambulatory Visit: Payer: Self-pay

## 2016-09-21 DIAGNOSIS — I83893 Varicose veins of bilateral lower extremities with other complications: Secondary | ICD-10-CM

## 2016-09-27 ENCOUNTER — Encounter: Payer: Self-pay | Admitting: Physician Assistant

## 2016-09-27 ENCOUNTER — Ambulatory Visit (INDEPENDENT_AMBULATORY_CARE_PROVIDER_SITE_OTHER): Payer: BLUE CROSS/BLUE SHIELD | Admitting: Physician Assistant

## 2016-09-27 VITALS — BP 123/82 | HR 81 | Temp 98.9°F | Resp 18 | Ht 67.0 in | Wt 276.8 lb

## 2016-09-27 DIAGNOSIS — B373 Candidiasis of vulva and vagina: Secondary | ICD-10-CM

## 2016-09-27 DIAGNOSIS — B3731 Acute candidiasis of vulva and vagina: Secondary | ICD-10-CM

## 2016-09-27 DIAGNOSIS — A5901 Trichomonal vulvovaginitis: Secondary | ICD-10-CM

## 2016-09-27 DIAGNOSIS — R52 Pain, unspecified: Secondary | ICD-10-CM | POA: Diagnosis not present

## 2016-09-27 DIAGNOSIS — L739 Follicular disorder, unspecified: Secondary | ICD-10-CM | POA: Diagnosis not present

## 2016-09-27 LAB — POCT WET + KOH PREP

## 2016-09-27 MED ORDER — IBUPROFEN 800 MG PO TABS: 800.0000 mg | ORAL_TABLET | Freq: Three times a day (TID) | ORAL | 0 refills | Status: DC | PRN

## 2016-09-27 MED ORDER — CEPHALEXIN 500 MG PO CAPS: 500.0000 mg | ORAL_CAPSULE | Freq: Three times a day (TID) | ORAL | 0 refills | Status: DC

## 2016-09-27 MED ORDER — METRONIDAZOLE 500 MG PO TABS: 500.0000 mg | ORAL_TABLET | Freq: Two times a day (BID) | ORAL | 0 refills | Status: AC

## 2016-09-27 NOTE — Progress Notes (Signed)
Diana Garrison  MRN: [id number] DOB: Jul 06, 1970  PCP: Morrell RiddleWeber, Lyann Hagstrom L, PA-C  Chief Complaint  Patient presents with  . Follow-up    recheck from labs last time     Subjective:  Pt presents to clinic for recheck to make sure her trich is gone.  She has no symptoms and she took the medication without problems and she has not had sex again with that partner.  She is also still having lots of leg aching that is helped with the motrin - she has the appt with vascular specialist next month - legs only hurt after she has been standing for long periods of time.  On her days off no problems.  No joint pain just mainly her lower legs. She also has a lesion on her groin - she has had something like this in the past but wants to have it checked out.  She has done nothing for the area.  History is obtained by patient.  Review of Systems  Genitourinary: Negative for vaginal discharge.  Musculoskeletal: Positive for myalgias.    Patient Active Problem List   Diagnosis Date Noted  . Back pain 09/04/2016  . Menorrhagia 06/06/2013  . High BMI 06/06/2013  . Smoker 06/06/2013    Current Outpatient Prescriptions on File Prior to Visit  Medication Sig Dispense Refill  . gabapentin (NEURONTIN) 100 MG capsule TAKE 1 TO 3 CAPSULES BY MOUTH ONCE DAILY AS NEEDED FOR BACK PAIN    . Omega-3 Fatty Acids (FISH OIL) 1000 MG CAPS Take 1 capsule (1,000 mg total) by mouth daily. 90 capsule 4  . [DISCONTINUED] cetirizine (ZYRTEC ALLERGY) 10 MG tablet Take 1 tablet (10 mg total) by mouth daily. 30 tablet 0   No current facility-administered medications on file prior to visit.     No Known Allergies  No past medical history on file. Social History   Social History Narrative   Lives with daughter       Seatbelt - sometimes      Guns in home - no   Social History  Substance Use Topics  . Smoking status: Current Every Day Smoker    Packs/day: 0.50    Years: 7.00  . Smokeless tobacco: Never Used  .  Alcohol use Yes     Comment: 4 drinks a week   family history includes Cirrhosis in her father; Pulmonary embolism in her sister.     Objective:  BP 123/82   Pulse 81   Temp 98.9 F (37.2 C) (Oral)   Resp 18   Ht 5\' 7"  (1.702 m)   Wt 276 lb 12.8 oz (125.6 kg)   LMP 09/19/2016   SpO2 99%   BMI 43.35 kg/m  Body mass index is 43.35 kg/m.  Physical Exam  Constitutional: She is oriented to person, place, and time and well-developed, well-nourished, and in no distress.  HENT:  Head: Normocephalic and atraumatic.  Right Ear: Hearing and external ear normal.  Left Ear: Hearing and external ear normal.  Eyes: Conjunctivae are normal.  Neck: Normal range of motion.  Cardiovascular: Normal rate, regular rhythm and normal heart sounds.   No murmur heard. Pulmonary/Chest: Effort normal and breath sounds normal. She has no wheezes.  Genitourinary: Vagina normal, uterus normal, cervix normal, right adnexa normal, left adnexa normal and vulva normal.  Neurological: She is alert and oriented to person, place, and time. Gait normal.  Skin: Skin is warm and dry. Rash noted. Rash is pustular (right buttocks/groin area).  Varicosities  bilateral lower legs  Psychiatric: Mood, memory, affect and judgment normal.  Vitals reviewed.   Results for orders placed or performed in visit on 09/27/16  POCT Wet + KOH Prep  Result Value Ref Range   Yeast by KOH Present (A) Absent   Yeast by wet prep Present (A) Absent   WBC by wet prep Few Few   Clue Cells Wet Prep HPF POC Few (A) None   Trich by wet prep Present (A) Absent   Bacteria Wet Prep HPF POC Many (A) Few   Epithelial Cells By [employer] ([facility]) Moderate (A) None, Few, Too numerous to count   RBC,UR,HPF,POC None None RBC/hpf    Assessment and Plan :  Vaginal trichomoniasis - Plan: POCT Wet + KOH Prep, metroNIDAZOLE (FLAGYL) 500 MG tablet - treat again but for longer  Aching pain - Plan: ibuprofen (ADVIL,MOTRIN) 800 MG tablet - likely  related to varicosities - she will start with compression socks and elevation while waiting on her vascular appt  Folliculitis - Plan: cephALEXin (KEFLEX) 500 MG capsule - warm compresses  Yeast vaginitis - Plan: fluconazole (DIFLUCAN) 150 MG tablet - treat after abx are finished.  Benny Lennert PA-C  [facility] at [facility] 10/03/2016 12:13 PM

## 2016-09-27 NOTE — Patient Instructions (Signed)
     IF you received an x-ray today, you will receive an invoice from [facility]. Please contact [facility] at [phone] with questions or concerns regarding your invoice.   IF you received labwork today, you will receive an invoice from [facility]. Please contact [facility] at [phone] with questions or concerns regarding your invoice.   Our billing staff will not be able to assist you with questions regarding bills from these companies.  You will be contacted with the lab results as soon as they are available. The fastest way to get your results is to activate your My Chart account. Instructions are located on the last page of this paperwork. If you have not heard from us regarding the results in 2 weeks, please contact this office.     

## 2016-10-02 ENCOUNTER — Encounter: Payer: Self-pay | Admitting: Physician Assistant

## 2016-10-03 ENCOUNTER — Encounter: Payer: Self-pay | Admitting: Physician Assistant

## 2016-10-03 ENCOUNTER — Other Ambulatory Visit: Payer: Self-pay | Admitting: Physician Assistant

## 2016-10-03 MED ORDER — FLUCONAZOLE 150 MG PO TABS: 150.0000 mg | ORAL_TABLET | Freq: Once | ORAL | 0 refills | Status: AC

## 2016-10-03 NOTE — Progress Notes (Signed)
Please let the patient know she also had a yeast infetion and I sent in diflucan that needs to be used after the abx are finished.

## 2016-10-05 ENCOUNTER — Telehealth: Payer: Self-pay

## 2016-10-05 NOTE — Progress Notes (Signed)
Called pt and left VM and asked Pt to call back because pt doesn't have a number checked off to leave a detailed message.

## 2016-10-05 NOTE — Telephone Encounter (Signed)
Called pt and left VM and asked Pt to call back because pt doesn't have a number checked off to leave a detailed VM.

## 2016-10-05 NOTE — Telephone Encounter (Signed)
Spoke with pt and confirmed that she picked up her meds and to start the diflucan after finishing abx

## 2016-10-16 ENCOUNTER — Encounter: Payer: Self-pay | Admitting: Vascular Surgery

## 2016-10-17 ENCOUNTER — Ambulatory Visit (INDEPENDENT_AMBULATORY_CARE_PROVIDER_SITE_OTHER): Payer: BLUE CROSS/BLUE SHIELD | Admitting: Vascular Surgery

## 2016-10-17 ENCOUNTER — Encounter: Payer: Self-pay | Admitting: Vascular Surgery

## 2016-10-17 VITALS — BP 122/85 | HR 72 | Temp 98.5°F | Resp 18 | Ht 66.5 in | Wt 275.2 lb

## 2016-10-17 DIAGNOSIS — I8393 Asymptomatic varicose veins of bilateral lower extremities: Secondary | ICD-10-CM

## 2016-10-17 NOTE — Progress Notes (Signed)
Subjective:     Patient ID: Diana Garrison, female   DOB: 1970/12/15, 46 y.o.   MRN: [id number]  HPI This 45 year old female was referred by Ricka Burdock PA for evaluation of varicose veins in both lower extremities with pain. Patient states she has had broken veins in both legs 20 years which have progressively gotten worse. She has aching discomfort throughout both legs all day. She is on her feet a lot with her occupation. She has no history of DVT thrombophlebitis stasis ulcers or bleeding. She will occasionally wear "support stockings" below the knee which do improve her symptoms. She was evaluated at [facility] in March 2018 and recommendation for "injections" was made. She did have an ultrasound that she does not have that with her today.  History reviewed. No pertinent past medical history.  Social History  Substance Use Topics  . Smoking status: Current Every Day Smoker    Packs/day: 0.50    Years: 7.00  . Smokeless tobacco: Never Used  . Alcohol use Yes     Comment: 4 drinks a week    Family History  Problem Relation Age of Onset  . Cirrhosis Father   . Pulmonary embolism Sister     No Known Allergies   Current Outpatient Prescriptions:  .  ibuprofen (ADVIL,MOTRIN) 800 MG tablet, Take 1 tablet (800 mg total) by mouth every 8 (eight) hours as needed., Disp: 90 tablet, Rfl: 0 .  Multiple Vitamin (MULTIVITAMIN) tablet, Take 1 tablet by mouth daily., Disp: , Rfl:  .  Omega-3 Fatty Acids (FISH OIL) 1000 MG CAPS, Take 1 capsule (1,000 mg total) by mouth daily., Disp: 90 capsule, Rfl: 4 .  cephALEXin (KEFLEX) 500 MG capsule, Take 1 capsule (500 mg total) by mouth 3 (three) times daily. (Patient not taking: Reported on 10/17/2016), Disp: 30 capsule, Rfl: 0 .  gabapentin (NEURONTIN) 100 MG capsule, TAKE 1 TO 3 CAPSULES BY MOUTH ONCE DAILY AS NEEDED FOR BACK PAIN, Disp: , Rfl:   Vitals:   10/17/16 0905  BP: 122/85  Pulse: 72  Resp: 18  Temp: 98.5 F (36.9 C)  TempSrc: Oral   SpO2: 99%  Weight: 275 lb 3.2 oz (124.8 kg)  Height: 5' 6.5" (1.689 m)    Body mass index is 43.75 kg/m.         Review of Systems Patient has morbid obesity. Denies chest pain, dyspnea on exertion, PND, orthopnea, hemoptysis.    Objective:   Physical Exam BP 122/85 (BP Location: Left Arm, Patient Position: Sitting, Cuff Size: Large)   Pulse 72   Temp 98.5 F (36.9 C) (Oral)   Resp 18   Ht 5' 6.5" (1.689 m)   Wt 275 lb 3.2 oz (124.8 kg)   LMP 09/19/2016   SpO2 99%   BMI 43.75 kg/m     Gen.-alert and oriented x3 in no apparent distress-morbidly obese HEENT normal for age Lungs no rhonchi or wheezing Cardiovascular regular rhythm no murmurs carotid pulses 3+ palpable no bruits audible Abdomen soft nontender no palpable masses Musculoskeletal free of  major deformities Skin clear -no rashes Neurologic normal Lower extremities 3+ femoral and dorsalis pedis pulses palpable bilaterally with no edema Diffuse bilateral spider and reticular veins particularly in the medial and lateral thighs and medial and lateral calf areas. No hyperpigmentation or ulceration noted distally. No bulging varicosities noted.  Today I performed a bedside SonoSite ultrasound exam which revealed normal-size great saphenous veins bilaterally with no evidence of reflux  Assessment:     #1 bilateral spider and reticular veins with no evidence of gross reflux in bilateral great saphenous veins on bedside SonoSite ultrasound exam #2 morbid obesity    Plan:     Explained to patient that no intervention was indicated such as laser ablation. Only treatment would consist of foam sclerotherapy. She was given contact information for Clementeen HoofLiz Wert and if she is interested in proceeding she will contact her.

## 2016-11-03 ENCOUNTER — Encounter: Payer: Self-pay | Admitting: Family Medicine

## 2016-11-03 ENCOUNTER — Ambulatory Visit (INDEPENDENT_AMBULATORY_CARE_PROVIDER_SITE_OTHER): Payer: BLUE CROSS/BLUE SHIELD | Admitting: Family Medicine

## 2016-11-03 VITALS — BP 115/78 | HR 76 | Temp 98.0°F | Resp 16 | Ht 67.0 in | Wt 281.0 lb

## 2016-11-03 DIAGNOSIS — L02213 Cutaneous abscess of chest wall: Secondary | ICD-10-CM | POA: Diagnosis not present

## 2016-11-03 DIAGNOSIS — Z23 Encounter for immunization: Secondary | ICD-10-CM

## 2016-11-03 MED ORDER — FLUCONAZOLE 150 MG PO TABS: 150.0000 mg | ORAL_TABLET | Freq: Once | ORAL | 0 refills | Status: AC

## 2016-11-03 MED ORDER — DOXYCYCLINE HYCLATE 100 MG PO CAPS: 100.0000 mg | ORAL_CAPSULE | Freq: Two times a day (BID) | ORAL | 0 refills | Status: DC

## 2016-11-03 NOTE — Patient Instructions (Addendum)
    Incision and Drainage, Care After Refer to this sheet in the next few weeks. These instructions provide you with information about caring for yourself after your procedure. Your health care provider may also give you more specific instructions. Your treatment has been planned according to current medical practices, but problems sometimes occur. Call your health care provider if you have any problems or questions after your procedure. What can I expect after the procedure? After the procedure, it is common to have:  Pain or discomfort around your incision site.  Drainage from your incision.  Follow these instructions at home:  Take over-the-counter and prescription medicines only as told by your health care provider.  If you were prescribed an antibiotic medicine, take it as told by your health care provider.Do not stop taking the antibiotic even if you start to feel better.  Followinstructions from your health care provider about: ? How to take care of your incision. ? When and how you should change your packing and bandage (dressing). Wash your hands with soap and water before you change your dressing. If soap and water are not available, use hand sanitizer. ? When you should remove your dressing.  Do not take baths, swim, or use a hot tub until your health care provider approves.  Keep all follow-up visits as told by your health care provider. This is important.  Check your incision area every day for signs of infection. Check for: ? More redness, swelling, or pain. ? More fluid or blood. ? Warmth. ? Pus or a bad smell. Contact a health care provider if:  Your cyst or abscess returns.  You have a fever.  You have more redness, swelling, or pain around your incision.  You have more fluid or blood coming from your incision.  Your incision feels warm to the touch.  You have pus or a bad smell coming from your incision. Get help right away if:  You have severe pain  or bleeding.  You cannot eat or drink without vomiting.  You have decreased urine output.  You become short of breath.  You have chest pain.  You cough up blood.  The area where the incision and drainage occurred becomes numb or it tingles. This information is not intended to replace advice given to you by your health care provider. Make sure you discuss any questions you have with your health care provider. Document Released: 05/22/2011 Document Revised: 07/30/2015 Document Reviewed: 12/18/2014 Elsevier Interactive Patient Education  2017 [employer].    IF you received an x-ray today, you will receive an invoice from [facility]. Please contact [facility] at [phone] with questions or concerns regarding your invoice.   IF you received labwork today, you will receive an invoice from [facility]. Please contact [facility] at [phone] with questions or concerns regarding your invoice.   Our billing staff will not be able to assist you with questions regarding bills from these companies.  You will be contacted with the lab results as soon as they are available. The fastest way to get your results is to activate your My Chart account. Instructions are located on the last page of this paperwork. If you have not heard from [place] regarding the results in 2 weeks, please contact this office.

## 2016-11-03 NOTE — Progress Notes (Signed)
Subjective:    Patient ID: Diana Garrison, female    DOB: Mar 01, 1971, 46 y.o.   MRN: [id number]  11/03/2016  Recurrent Skin Infections (between breast x 3 days )   HPI This 46 y.o. female presents for evaluation of abscess of chest wall.  Onset three days ago.  Denies fever/chills/sweats; denies vesicles.  Denies trauma.  Has been applying warm compresses to area with worsening swelling and pain and redness.  No drainage.   BP Readings from Last 3 Encounters:  11/03/16 115/78  10/17/16 122/85  09/27/16 123/82   Wt Readings from Last 3 Encounters:  11/03/16 281 lb (127.5 kg)  10/17/16 275 lb 3.2 oz (124.8 kg)  09/27/16 276 lb 12.8 oz (125.6 kg)   Immunization History  Administered Date(s) Administered  . Influenza,inj,Quad PF,6+ Mos 11/03/2016  . Tdap 09/04/2016    Review of Systems  Constitutional: Negative for chills, diaphoresis, fatigue and fever.  HENT: Negative for ear pain, postnasal drip, rhinorrhea, sinus pressure, sore throat and trouble swallowing.   Respiratory: Negative for cough and shortness of breath.   Cardiovascular: Negative for chest pain, palpitations and leg swelling.  Gastrointestinal: Negative for abdominal pain, constipation, diarrhea, nausea and vomiting.  Skin: Positive for color change and wound. Negative for pallor and rash.    History reviewed. No pertinent past medical history. Past Surgical History:  Procedure Laterality Date  . CESAREAN SECTION    . GANGLION CYST EXCISION     x2   No Known Allergies Current Outpatient Prescriptions  Medication Sig Dispense Refill  . gabapentin (NEURONTIN) 100 MG capsule TAKE 1 TO 3 CAPSULES BY MOUTH ONCE DAILY AS NEEDED FOR BACK PAIN    . ibuprofen (ADVIL,MOTRIN) 800 MG tablet Take 1 tablet (800 mg total) by mouth every 8 (eight) hours as needed. 90 tablet 0  . Multiple Vitamin (MULTIVITAMIN) tablet Take 1 tablet by mouth daily.    . Omega-3 Fatty Acids (FISH OIL) 1000 MG CAPS Take 1 capsule (1,000  mg total) by mouth daily. 90 capsule 4  . doxycycline (VIBRAMYCIN) 100 MG capsule Take 1 capsule (100 mg total) by mouth 2 (two) times daily. 14 capsule 0  . Multiple Vitamins-Minerals (CENTRUM ADULTS) TABS Take 1 tablet by mouth daily. 90 tablet 4   No current facility-administered medications for this visit.    Social History   Social History  . Marital status: Single    Spouse name: N/A  . Number of children: N/A  . Years of education: N/A   Occupational History  . CNA     [employer]   Social History Main Topics  . Smoking status: Current Every Day Smoker    Packs/day: 0.50    Years: 7.00  . Smokeless tobacco: Never Used  . Alcohol use Yes     Comment: 4 drinks a week  . Drug use: No  . Sexual activity: Yes    Partners: Male    Birth control/ protection: None, Condom     Comment: 10/04/14: Not sexually active x5 mo due to spouse having had a CVA   Other Topics Concern  . Not on file   Social History Narrative   Lives with daughter       Seatbelt - sometimes      Guns in home - no   Family History  Problem Relation Age of Onset  . Cirrhosis Father   . Pulmonary embolism Sister        Objective:    BP 115/78  Pulse 76   Temp 98 F (36.7 C) (Oral)   Resp 16   Ht 5\' 7"  (1.702 m)   Wt 281 lb (127.5 kg)   LMP 10/31/2016 (Approximate)   SpO2 98%   BMI 44.01 kg/m  Physical Exam  Constitutional: She is oriented to person, place, and time. She appears well-developed and well-nourished. No distress.  HENT:  Head: Normocephalic and atraumatic.  Eyes: Pupils are equal, round, and reactive to light. Conjunctivae are normal.  Neck: Normal range of motion. Neck supple.  Cardiovascular: Normal rate, regular rhythm and normal heart sounds.  Exam reveals no gallop and no friction rub.   No murmur heard. Pulmonary/Chest: Effort normal and breath sounds normal. She has no wheezes. She has no rales.    1.5 cm area of erythema, warmth, with fluctuance, with a  large centralized pustule.  +TTP.  Neurological: She is alert and oriented to person, place, and time.  Skin: Skin is warm and dry. She is not diaphoretic. There is erythema.  Psychiatric: She has a normal mood and affect. Her behavior is normal.  Nursing note and vitals reviewed.  PROCEDURE NOTE: VERBAL CONSENT OBTAINED; AREA PREPPED IN STERILE FASHION; 1 CC OF 1% LIDOCAINE WITH EPI ADMINISTERED.  11 BLADE SCALPEL ADVANCED INTO ABSCESS; LARGE AMOUNT OF MALODOROUS WHITE-YELLOW DRAINAGE EXPRESSED; PACKING ADVANCED INTO WOUND; BANDAGE APPLIED WITH GOOD HEMOSTASIS.  PT TOLERATED PROCEDURE WELL.   No results found. Depression screen PHQ 2/9 11/03/2016  Decreased Interest 0  Down, Depressed, Hopeless 0  PHQ - 2 Score 0  Some encounter information is confidential and restricted. Go to Review Flowsheets activity to see all data.   Fall Risk  11/03/2016  Falls in the past year? No  Some encounter information is confidential and restricted. Go to Review Flowsheets activity to see all data.       Assessment & Plan:   1. Abscess of chest wall   2. Need for prophylactic vaccination and inoculation against influenza    -New onset abscess of chest wall; s/p I&D complex with packing.  -send wound cx; treat with Doxy. -RTC 48-72 hours for packing removal.  -RTC sooner for fever, increasing pain.   Orders Placed This Encounter  Procedures  . WOUND CULTURE    Order Specific Question:   Source    Answer:   R chest wall  . Flu Vaccine QUAD 36+ mos IM   Meds ordered this encounter  Medications  . doxycycline (VIBRAMYCIN) 100 MG capsule    Sig: Take 1 capsule (100 mg total) by mouth 2 (two) times daily.    Dispense:  14 capsule    Refill:  0  . fluconazole (DIFLUCAN) 150 MG tablet    Sig: Take 1 tablet (150 mg total) by mouth once. Repeat if needed    Dispense:  2 tablet    Refill:  0    Return in about 4 years (around 11/03/2020) for  recheck abscess with Dr. Katrinka Blazing at [address].   Sissi Padia Paulita Fujita, M.D. Primary Care at [facility] [address] [place], [place]  [address] [phone] phone [phone] fax

## 2016-11-06 ENCOUNTER — Encounter (HOSPITAL_COMMUNITY): Payer: BLUE CROSS/BLUE SHIELD

## 2016-11-06 ENCOUNTER — Encounter: Payer: Self-pay | Admitting: Physician Assistant

## 2016-11-06 ENCOUNTER — Encounter: Payer: BLUE CROSS/BLUE SHIELD | Admitting: Surgery

## 2016-11-06 LAB — WOUND CULTURE

## 2016-11-07 ENCOUNTER — Ambulatory Visit (INDEPENDENT_AMBULATORY_CARE_PROVIDER_SITE_OTHER): Payer: BLUE CROSS/BLUE SHIELD | Admitting: Family Medicine

## 2016-11-07 DIAGNOSIS — L02213 Cutaneous abscess of chest wall: Secondary | ICD-10-CM

## 2016-11-08 ENCOUNTER — Ambulatory Visit: Payer: BLUE CROSS/BLUE SHIELD | Admitting: Family Medicine

## 2016-11-08 MED ORDER — CENTRUM ADULTS PO TABS: 1.0000 | ORAL_TABLET | Freq: Every day | ORAL | 4 refills | Status: DC

## 2016-11-13 ENCOUNTER — Encounter: Payer: Self-pay | Admitting: Family Medicine

## 2016-11-13 NOTE — Progress Notes (Signed)
Subjective:    Patient ID: Diana Garrison, female    DOB: 1970/09/26, 46 y.o.   MRN: [id number]  11/07/2016  Abscess (follow-up from 11/03/16)   HPI This 46 y.o. female presents for 4 day follow-up of chest wall abscess.  Doing much better.  Packing fell out in past two days. Denies fever/chills/sweats.  Significant amount of drainage from abscess for first 48 hours but much improved.  Tolerating Doxycycline without difficulties.   BP Readings from Last 3 Encounters:  11/03/16 115/78  10/17/16 122/85  09/27/16 123/82   Wt Readings from Last 3 Encounters:  11/03/16 281 lb (127.5 kg)  10/17/16 275 lb 3.2 oz (124.8 kg)  09/27/16 276 lb 12.8 oz (125.6 kg)   Immunization History  Administered Date(s) Administered  . Influenza,inj,Quad PF,6+ Mos 11/03/2016  . Tdap 09/04/2016    Review of Systems  Constitutional: Negative for chills, diaphoresis, fatigue and fever.  HENT: Negative for ear pain, postnasal drip, rhinorrhea, sinus pressure, sore throat and trouble swallowing.   Respiratory: Negative for cough and shortness of breath.   Cardiovascular: Negative for chest pain, palpitations and leg swelling.  Gastrointestinal: Negative for abdominal pain, constipation, diarrhea, nausea and vomiting.  Skin: Positive for color change and wound. Negative for pallor and rash.    History reviewed. No pertinent past medical history. Past Surgical History:  Procedure Laterality Date  . CESAREAN SECTION    . GANGLION CYST EXCISION     x2   No Known Allergies Current Outpatient Prescriptions  Medication Sig Dispense Refill  . doxycycline (VIBRAMYCIN) 100 MG capsule Take 1 capsule (100 mg total) by mouth 2 (two) times daily. 14 capsule 0  . gabapentin (NEURONTIN) 100 MG capsule TAKE 1 TO 3 CAPSULES BY MOUTH ONCE DAILY AS NEEDED FOR BACK PAIN    . ibuprofen (ADVIL,MOTRIN) 800 MG tablet Take 1 tablet (800 mg total) by mouth every 8 (eight) hours as needed. 90 tablet 0  . Multiple Vitamin  (MULTIVITAMIN) tablet Take 1 tablet by mouth daily.    . Multiple Vitamins-Minerals (CENTRUM ADULTS) TABS Take 1 tablet by mouth daily. 90 tablet 4  . Omega-3 Fatty Acids (FISH OIL) 1000 MG CAPS Take 1 capsule (1,000 mg total) by mouth daily. 90 capsule 4   No current facility-administered medications for this visit.    Social History   Social History  . Marital status: Single    Spouse name: N/A  . Number of children: N/A  . Years of education: N/A   Occupational History  . CNA     [employer]   Social History Main Topics  . Smoking status: Current Every Day Smoker    Packs/day: 0.50    Years: 7.00  . Smokeless tobacco: Never Used  . Alcohol use Yes     Comment: 4 drinks a week  . Drug use: No  . Sexual activity: Yes    Partners: Male    Birth control/ protection: None, Condom     Comment: 10/04/14: Not sexually active x5 mo due to spouse having had a CVA   Other Topics Concern  . Not on file   Social History Narrative   Lives with daughter       Seatbelt - sometimes      Guns in home - no   Family History  Problem Relation Age of Onset  . Cirrhosis Father   . Pulmonary embolism Sister        Objective:    LMP 10/31/2016 (Approximate)  Physical  Exam  Constitutional: She is oriented to person, place, and time. She appears well-developed and well-nourished. No distress.  HENT:  Head: Normocephalic and atraumatic.  Eyes: Pupils are equal, round, and reactive to light. Conjunctivae are normal.  Neck: Normal range of motion. Neck supple.  Cardiovascular: Normal rate, regular rhythm and normal heart sounds.  Exam reveals no gallop and no friction rub.   No murmur heard. Pulmonary/Chest: Effort normal and breath sounds normal. She has no wheezes. She has no rales.    Well healing incision 5mm at chest wall; minimal erythema; no swelling, warmth, or fluctuance.  Neurological: She is alert and oriented to person, place, and time.  Skin: Skin is warm and dry.  She is not diaphoretic. There is erythema.  Psychiatric: She has a normal mood and affect. Her behavior is normal.  Nursing note and vitals reviewed.  Results for orders placed or performed in visit on 11/03/16  WOUND CULTURE  Result Value Ref Range   Gram Stain Result Final report    Organism ID, Bacteria Comment    Organism ID, Bacteria Comment    Aerobic Bacterial Culture Final report    Organism ID, Bacteria Mixed skin flora    No results found. Depression screen PHQ 2/9 11/03/2016  Decreased Interest 0  Down, Depressed, Hopeless 0  PHQ - 2 Score 0  Some encounter information is confidential and restricted. Go to Review Flowsheets activity to see all data.   Fall Risk  11/03/2016  Falls in the past year? No  Some encounter information is confidential and restricted. Go to Review Flowsheets activity to see all data.        Assessment & Plan:   1. Abscess of chest wall    -much improved; packing fell out prior to follow-up visit. -wound cx negative. -complete Doxycycline -local wound care.   No orders of the defined types were placed in this encounter.  No orders of the defined types were placed in this encounter.   No Follow-up on file.   Samarah Hogle Paulita FujitaMartin Charlotte Brafford, M.D. Primary Care at [facility]  [place] [facility] [address] [place], [place]  [id number] [phone] phone [phone] fax

## 2016-11-20 ENCOUNTER — Other Ambulatory Visit: Payer: Self-pay | Admitting: Physician Assistant

## 2016-11-20 DIAGNOSIS — R52 Pain, unspecified: Secondary | ICD-10-CM

## 2016-11-25 MED ORDER — IBUPROFEN 800 MG PO TABS: 800.0000 mg | ORAL_TABLET | Freq: Three times a day (TID) | ORAL | 0 refills | Status: DC | PRN

## 2016-12-25 ENCOUNTER — Ambulatory Visit (INDEPENDENT_AMBULATORY_CARE_PROVIDER_SITE_OTHER): Payer: BLUE CROSS/BLUE SHIELD | Admitting: Physician Assistant

## 2016-12-25 ENCOUNTER — Encounter: Payer: Self-pay | Admitting: Physician Assistant

## 2016-12-25 VITALS — BP 118/82 | HR 107 | Temp 98.8°F | Resp 18 | Ht 67.0 in | Wt 283.6 lb

## 2016-12-25 DIAGNOSIS — Z8619 Personal history of other infectious and parasitic diseases: Secondary | ICD-10-CM | POA: Diagnosis not present

## 2016-12-25 NOTE — Patient Instructions (Signed)
     IF you received an x-ray today, you will receive an invoice from [facility]. Please contact [facility] at [phone] with questions or concerns regarding your invoice.   IF you received labwork today, you will receive an invoice from [facility]. Please contact [facility] at [phone] with questions or concerns regarding your invoice.   Our billing staff will not be able to assist you with questions regarding bills from these companies.  You will be contacted with the lab results as soon as they are available. The fastest way to get your results is to activate your My Chart account. Instructions are located on the last page of this paperwork. If you have not heard from us regarding the results in 2 weeks, please contact this office.     

## 2016-12-25 NOTE — Progress Notes (Signed)
   Diana Garrison  MRN: [id number] DOB: September 14, 1970  PCP: Morrell Riddle, PA-C  Chief Complaint  Patient presents with  . lab work    rechecking labs for trich     Subjective:  Pt presents to clinic for recheck vagina infection.  She is having no symptoms but wants to make sure the trich is gone.  She does have a new partner and would like other testing done to make sure she has nothing.  History is obtained by patient.  Review of Systems  Genitourinary: Negative for vaginal discharge.    Patient Active Problem List   Diagnosis Date Noted  . Spider veins of both lower extremities 10/17/2016  . Back pain 09/04/2016  . Menorrhagia 06/06/2013  . High BMI 06/06/2013  . Smoker 06/06/2013    Current Outpatient Prescriptions on File Prior to Visit  Medication Sig Dispense Refill  . gabapentin (NEURONTIN) 100 MG capsule TAKE 1 TO 3 CAPSULES BY MOUTH ONCE DAILY AS NEEDED FOR BACK PAIN    . ibuprofen (ADVIL,MOTRIN) 800 MG tablet Take 1 tablet (800 mg total) by mouth every 8 (eight) hours as needed. 90 tablet 0  . Multiple Vitamins-Minerals (CENTRUM ADULTS) TABS Take 1 tablet by mouth daily. 90 tablet 4  . Omega-3 Fatty Acids (FISH OIL) 1000 MG CAPS Take 1 capsule (1,000 mg total) by mouth daily. 90 capsule 4  . Multiple Vitamin (MULTIVITAMIN) tablet Take 1 tablet by mouth daily.    . [DISCONTINUED] cetirizine (ZYRTEC ALLERGY) 10 MG tablet Take 1 tablet (10 mg total) by mouth daily. 30 tablet 0   No current facility-administered medications on file prior to visit.     No Known Allergies  No past medical history on file. Social History   Social History Narrative   Lives with daughter       Seatbelt - sometimes      Guns in home - no   Social History  Substance Use Topics  . Smoking status: Current Every Day Smoker    Packs/day: 0.50    Years: 7.00  . Smokeless tobacco: Never Used  . Alcohol use Yes     Comment: 4 drinks a week   family history includes Cirrhosis in  her father; Pulmonary embolism in her sister.     Objective:  BP 118/82   Pulse (!) 107   Temp 98.8 F (37.1 C) (Oral)   Resp 18   Ht  (1.702 m)   Wt 283 lb 9.6 oz (128.6 kg)   LMP 12/13/2016   SpO2 96%   BMI 44.42 kg/m  Body mass index is 44.42 kg/m.  Physical Exam  Constitutional: She is oriented to person, place, and time and well-developed, well-nourished, and in no distress.  HENT:  Head: Normocephalic and atraumatic.  Right Ear: Hearing and external ear normal.  Left Ear: Hearing and external ear normal.  Eyes: Conjunctivae are normal.  Neck: Normal range of motion.  Pulmonary/Chest: Effort normal.  Neurological: She is alert and oriented to person, place, and time. Gait normal.  Skin: Skin is warm and dry.  Psychiatric: Mood, memory, affect and judgment normal.  Vitals reviewed.   Assessment and Plan :  History of trichomoniasis - Plan: Trichomonas vaginalis, RNA, GC/Chlamydia Probe Amp   Check labs - hopefully her 2nd round of treatment worked - she is having no symptoms currently.  Benny Lennert PA-C  Primary Care at [facility] 12/25/2016 4:02 PM

## 2016-12-26 LAB — GC/CHLAMYDIA PROBE AMP
CHLAMYDIA, DNA PROBE: NEGATIVE
Neisseria gonorrhoeae by PCR: NEGATIVE

## 2016-12-27 LAB — TRICHOMONAS VAGINALIS, PROBE AMP: Trich vag by NAA: NEGATIVE

## 2017-01-03 ENCOUNTER — Other Ambulatory Visit: Payer: Self-pay | Admitting: Physician Assistant

## 2017-01-03 DIAGNOSIS — R52 Pain, unspecified: Secondary | ICD-10-CM

## 2017-01-03 DIAGNOSIS — F4323 Adjustment disorder with mixed anxiety and depressed mood: Secondary | ICD-10-CM | POA: Diagnosis not present

## 2017-01-04 MED ORDER — IBUPROFEN 800 MG PO TABS: 800.0000 mg | ORAL_TABLET | Freq: Three times a day (TID) | ORAL | 0 refills | Status: DC | PRN

## 2017-01-17 DIAGNOSIS — F4323 Adjustment disorder with mixed anxiety and depressed mood: Secondary | ICD-10-CM | POA: Diagnosis not present

## 2017-04-03 ENCOUNTER — Encounter: Payer: Self-pay | Admitting: Physician Assistant

## 2017-04-18 ENCOUNTER — Encounter: Payer: Self-pay | Admitting: Physician Assistant

## 2017-04-18 ENCOUNTER — Ambulatory Visit (INDEPENDENT_AMBULATORY_CARE_PROVIDER_SITE_OTHER): Payer: BLUE CROSS/BLUE SHIELD | Admitting: Physician Assistant

## 2017-04-18 ENCOUNTER — Other Ambulatory Visit: Payer: Self-pay

## 2017-04-18 VITALS — BP 126/72 | HR 80 | Temp 98.7°F | Ht 66.54 in | Wt 298.6 lb

## 2017-04-18 DIAGNOSIS — Z124 Encounter for screening for malignant neoplasm of cervix: Secondary | ICD-10-CM | POA: Diagnosis not present

## 2017-04-18 DIAGNOSIS — R109 Unspecified abdominal pain: Secondary | ICD-10-CM

## 2017-04-18 DIAGNOSIS — Z113 Encounter for screening for infections with a predominantly sexual mode of transmission: Secondary | ICD-10-CM

## 2017-04-18 DIAGNOSIS — Z202 Contact with and (suspected) exposure to infections with a predominantly sexual mode of transmission: Secondary | ICD-10-CM | POA: Diagnosis not present

## 2017-04-18 LAB — POCT URINALYSIS DIP (MANUAL ENTRY)
BILIRUBIN UA: NEGATIVE mg/dL
Bilirubin, UA: NEGATIVE
Blood, UA: NEGATIVE
Glucose, UA: NEGATIVE mg/dL
LEUKOCYTES UA: NEGATIVE
NITRITE UA: NEGATIVE
PH UA: 6 (ref 5.0–8.0)
PROTEIN UA: NEGATIVE mg/dL
Spec Grav, UA: 1.02 (ref 1.010–1.025)
Urobilinogen, UA: 0.2 E.U./dL

## 2017-04-18 LAB — POCT WET + KOH PREP
Trich by wet prep: ABSENT
YEAST BY WET PREP: ABSENT
Yeast by KOH: ABSENT

## 2017-04-18 LAB — POCT URINE PREGNANCY: Preg Test, Ur: NEGATIVE

## 2017-04-18 MED ORDER — AZITHROMYCIN 250 MG PO TABS: 1000.0000 mg | ORAL_TABLET | Freq: Once | ORAL | 0 refills | Status: AC

## 2017-04-18 MED ORDER — CEFTRIAXONE SODIUM 250 MG IJ SOLR
250.0000 mg | Freq: Once | INTRAMUSCULAR | Status: AC
  Administered 2017-04-18: 250 mg via INTRAMUSCULAR

## 2017-04-18 NOTE — Progress Notes (Signed)
PRIMARY CARE AT [facility] [address] [place] [address] [phone]  Date:  04/18/2017   Name:  Diana Garrison   DOB:  Jan 09, 1971   MRN:  [id number]  PCP:  Morrell Riddle, PA-C    History of Present Illness:  Diana Garrison is a 47 y.o. female patient who presents to PCP with  Chief Complaint  Patient presents with  . Abdominal Pain  . STI Testing     One week lower abdomen without radiation, at the center.  Feels like cramping and squeezing She has had some loose stool.   No blood or black stool.   No abnormal vaginal discharge No dysuria, hematuria, or frequency. No fever no nausea.  Sexually active with period, unprotected.   She has a hx of trich last year with same partner.   2 months ago of unprotected sex.   Recently saw partner's phone with a message of another sexual encounter that had contracted an infection.  Patient's last menstrual period was 04/13/2017 (exact date).   Patient Active Problem List   Diagnosis Date Noted  . Spider veins of both lower extremities 10/17/2016  . Back pain 09/04/2016  . Menorrhagia 06/06/2013  . High BMI 06/06/2013  . Smoker 06/06/2013    History reviewed. No pertinent past medical history.  Past Surgical History:  Procedure Laterality Date  . CESAREAN SECTION    . GANGLION CYST EXCISION     x2    Social History   Tobacco Use  . Smoking status: Current Every Day Smoker    Packs/day: 0.50    Years: 7.00    Pack years: 3.50  . Smokeless tobacco: Never Used  Substance Use Topics  . Alcohol use: Yes    Comment: 4 drinks a week  . Drug use: No    Family History  Problem Relation Age of Onset  . Cirrhosis Father   . Pulmonary embolism Sister     No Known Allergies  Medication list has been reviewed and updated.  Current Outpatient Medications on File Prior to Visit  Medication Sig Dispense Refill  . ibuprofen (ADVIL,MOTRIN) 800 MG tablet Take 1 tablet (800 mg total) by mouth every 8 (eight) hours as  needed. 90 tablet 0  . Multiple Vitamins-Minerals (CENTRUM ADULTS) TABS Take 1 tablet by mouth daily. 90 tablet 4  . Omega-3 Fatty Acids (FISH OIL) 1000 MG CAPS Take 1 capsule (1,000 mg total) by mouth daily. 90 capsule 4  . gabapentin (NEURONTIN) 100 MG capsule TAKE 1 TO 3 CAPSULES BY MOUTH ONCE DAILY AS NEEDED FOR BACK PAIN    . Multiple Vitamin (MULTIVITAMIN) tablet Take 1 tablet by mouth daily.    . [DISCONTINUED] cetirizine (ZYRTEC ALLERGY) 10 MG tablet Take 1 tablet (10 mg total) by mouth daily. 30 tablet 0   No current facility-administered medications on file prior to visit.     ROS ROS otherwise unremarkable unless listed above.  Physical Examination: BP 126/72 (BP Location: Left Arm, Patient Position: Sitting, Cuff Size: Large)   Pulse 80   Temp 98.7 F (37.1 C) (Oral)   Ht 5' 6.54" (1.69 m)   Wt 298 lb 9.6 oz (135.4 kg)   LMP 04/13/2017 (Exact Date)   SpO2 98%   BMI 47.42 kg/m  Ideal Body Weight: Weight in (lb) to have BMI = 25: 157.1  Physical Exam  Constitutional: She is oriented to person, place, and time. She appears well-developed and well-nourished. No distress.  HENT:  Head: Normocephalic and atraumatic.  Right Ear: External ear normal.  Left Ear: External ear normal.  Eyes: Conjunctivae and EOM are normal. Pupils are equal, round, and reactive to light.  Cardiovascular: Normal rate.  Pulmonary/Chest: Effort normal. No respiratory distress.  Abdominal: Normal appearance and bowel sounds are normal. There is tenderness in the suprapubic area.  Genitourinary: Pelvic exam was performed with patient supine. There is no rash on the right labia. There is no rash on the left labia. Uterus is tender. Cervix exhibits no motion tenderness, no discharge and no friability. Right adnexum displays no mass and no fullness. Left adnexum displays no mass and no fullness. No tenderness or bleeding in the vagina. Vaginal discharge found.  Neurological: She is alert and oriented to  person, place, and time.  Skin: She is not diaphoretic.  Psychiatric: She has a normal mood and affect. Her behavior is normal.     Assessment and Plan: Diana Garrison is a 47 y.o. female who is here today for cc of  Chief Complaint  Patient presents with  . Abdominal Pain  . STI Testing  will perform pap as this has not been performed.   Treating for gc/chl at this time.  May need to consider pid, if this is positive.  Abdominal pain, unspecified abdominal location - Plan: POCT urinalysis dipstick, POCT Wet + KOH Prep, Trichomonas vaginalis, RNA  STD exposure - Plan: Trichomonas vaginalis, RNA, azithromycin (ZITHROMAX) 250 MG tablet, cefTRIAXone (ROCEPHIN) injection 250 mg  Screening for STD (sexually transmitted disease) - Plan: HIV antibody, RPR, POCT Wet + KOH Prep, Pap IG, CT/NG w/ reflex HPV when ASC-U, POCT urine pregnancy, Trichomonas vaginalis, RNA  Screening for cervical cancer - Plan: Pap IG, CT/NG w/ reflex HPV when ASC-U  Trena PlattStephanie Josilynn Losh, PA-C [facility] 2/19/20198:57 AM

## 2017-04-18 NOTE — Patient Instructions (Signed)
     IF you received an x-ray today, you will receive an invoice from [facility]. Please contact [facility] at [phone] with questions or concerns regarding your invoice.   IF you received labwork today, you will receive an invoice from [facility]. Please contact [facility] at [phone] with questions or concerns regarding your invoice.   Our billing staff will not be able to assist you with questions regarding bills from these companies.  You will be contacted with the lab results as soon as they are available. The fastest way to get your results is to activate your My Chart account. Instructions are located on the last page of this paperwork. If you have not heard from us regarding the results in 2 weeks, please contact this office.     

## 2017-04-18 NOTE — Progress Notes (Deleted)
PRIMARY CARE AT [facility] [address], [place] [address] [phone]  Date:  04/18/2017   Name:  Diana Garrison   DOB:  05-16-1970   MRN:  [id number]  PCP:  Morrell Riddle, PA-C    History of Present Illness:  Diana Garrison is a 47 y.o. female patient who presents to PCP with  Chief Complaint  Patient presents with  . Abdominal Pain  . STI Testing       Patient Active Problem List   Diagnosis Date Noted  . Spider veins of both lower extremities 10/17/2016  . Back pain 09/04/2016  . Menorrhagia 06/06/2013  . High BMI 06/06/2013  . Smoker 06/06/2013    History reviewed. No pertinent past medical history.  Past Surgical History:  Procedure Laterality Date  . CESAREAN SECTION    . GANGLION CYST EXCISION     x2    Social History   Tobacco Use  . Smoking status: Current Every Day Smoker    Packs/day: 0.50    Years: 7.00    Pack years: 3.50  . Smokeless tobacco: Never Used  Substance Use Topics  . Alcohol use: Yes    Comment: 4 drinks a week  . Drug use: No    Family History  Problem Relation Age of Onset  . Cirrhosis Father   . Pulmonary embolism Sister     No Known Allergies  Medication list has been reviewed and updated.  Current Outpatient Medications on File Prior to Visit  Medication Sig Dispense Refill  . ibuprofen (ADVIL,MOTRIN) 800 MG tablet Take 1 tablet (800 mg total) by mouth every 8 (eight) hours as needed. 90 tablet 0  . Multiple Vitamins-Minerals (CENTRUM ADULTS) TABS Take 1 tablet by mouth daily. 90 tablet 4  . Omega-3 Fatty Acids (FISH OIL) 1000 MG CAPS Take 1 capsule (1,000 mg total) by mouth daily. 90 capsule 4  . gabapentin (NEURONTIN) 100 MG capsule TAKE 1 TO 3 CAPSULES BY MOUTH ONCE DAILY AS NEEDED FOR BACK PAIN    . Multiple Vitamin (MULTIVITAMIN) tablet Take 1 tablet by mouth daily.    . [DISCONTINUED] cetirizine (ZYRTEC ALLERGY) 10 MG tablet Take 1 tablet (10 mg total) by mouth daily. 30 tablet 0   No current  facility-administered medications on file prior to visit.     ROS ROS otherwise unremarkable unless listed above.  Physical Examination: BP 126/72 (BP Location: Left Arm, Patient Position: Sitting, Cuff Size: Large)   Pulse 80   Temp 98.7 F (37.1 C) (Oral)   Ht 5' 6.54" (1.69 m)   Wt 298 lb 9.6 oz (135.4 kg)   LMP 04/13/2017 (Exact Date)   SpO2 98%   BMI 47.42 kg/m  Ideal Body Weight: Weight in (lb) to have BMI = 25: 157.1  Physical Exam  Results for orders placed or performed in visit on 04/18/17  POCT urinalysis dipstick  Result Value Ref Range   Color, UA yellow yellow   Clarity, UA clear clear   Glucose, UA negative negative mg/dL   Bilirubin, UA negative negative   Ketones, POC UA negative negative mg/dL   Spec Grav, UA 9.562 1.308 - 1.025   Blood, UA negative negative   pH, UA 6.0 5.0 - 8.0   Protein Ur, POC negative negative mg/dL   Urobilinogen, UA 0.2 0.2 or 1.0 E.U./dL   Nitrite, UA Negative Negative   Leukocytes, UA Negative Negative   Results for orders placed or performed in visit on 04/18/17  POCT urinalysis dipstick  Result Value Ref Range   Color, UA yellow yellow   Clarity, UA clear clear   Glucose, UA negative negative mg/dL   Bilirubin, UA negative negative   Ketones, POC UA negative negative mg/dL   Spec Grav, UA 4.0981.020 1.1911.010 - 1.025   Blood, UA negative negative   pH, UA 6.0 5.0 - 8.0   Protein Ur, POC negative negative mg/dL   Urobilinogen, UA 0.2 0.2 or 1.0 E.U./dL   Nitrite, UA Negative Negative   Leukocytes, UA Negative Negative  POCT urine pregnancy  Result Value Ref Range   Preg Test, Ur Negative Negative   Assessment and Plan: Diana Garrison is a 47 y.o. female who is here today  There are no diagnoses linked to this encounter.  Trena PlattStephanie Carlson Belland, PA-C [facility] 04/18/2017 12:04 PM

## 2017-04-19 LAB — RPR: RPR Ser Ql: NONREACTIVE

## 2017-04-19 LAB — TRICHOMONAS VAGINALIS, PROBE AMP: Trich vag by NAA: NEGATIVE

## 2017-04-19 LAB — HIV ANTIBODY (ROUTINE TESTING W REFLEX): HIV Screen 4th Generation wRfx: NONREACTIVE

## 2017-04-20 ENCOUNTER — Encounter: Payer: Self-pay | Admitting: Physician Assistant

## 2017-04-20 LAB — PAP IG, CT-NG, RFX HPV ASCU
CHLAMYDIA, NUC. ACID AMP: NEGATIVE
GONOCOCCUS BY NUCLEIC ACID AMP: NEGATIVE
PAP SMEAR COMMENT: 0

## 2017-06-20 ENCOUNTER — Encounter: Payer: Self-pay | Admitting: Physician Assistant

## 2017-07-03 ENCOUNTER — Other Ambulatory Visit: Payer: Self-pay | Admitting: Physician Assistant

## 2017-07-03 DIAGNOSIS — R52 Pain, unspecified: Secondary | ICD-10-CM

## 2017-07-03 MED ORDER — IBUPROFEN 800 MG PO TABS: 800.0000 mg | ORAL_TABLET | Freq: Three times a day (TID) | ORAL | 0 refills | Status: DC | PRN

## 2017-07-05 ENCOUNTER — Ambulatory Visit (HOSPITAL_COMMUNITY)
Admission: EM | Admit: 2017-07-05 | Discharge: 2017-07-05 | Disposition: A | Discharge status: Home / Self Care | Payer: BLUE CROSS/BLUE SHIELD | Attending: Internal Medicine | Admitting: Internal Medicine

## 2017-07-05 ENCOUNTER — Encounter (HOSPITAL_COMMUNITY): Payer: Self-pay | Admitting: Family Medicine

## 2017-07-05 DIAGNOSIS — M7661 Achilles tendinitis, right leg: Secondary | ICD-10-CM

## 2017-07-05 MED ORDER — DICLOFENAC SODIUM 75 MG PO TBEC: 75.0000 mg | DELAYED_RELEASE_TABLET | Freq: Two times a day (BID) | ORAL | 0 refills | Status: DC

## 2017-07-05 NOTE — Discharge Instructions (Signed)
Twice a day diclofenac, take with food, do not take additional ibuprofen. Use of boot for foot support, may transition to use of ACE wrap as symptoms improve. Please follow up with orthopedics for recheck in the next week or sooner if additional work restrictions are needed. Ice and elevation of foot to help with pain.

## 2017-07-05 NOTE — ED Triage Notes (Signed)
Pt here for right foot pain in the achilles area. Denies any injury. She is unable to put weight on the foot.

## 2017-07-05 NOTE — ED Provider Notes (Signed)
[facility]    CSN: [id number] Arrival date & time: 07/05/17  1019     History   Chief Complaint Chief Complaint  Patient presents with  . Foot Pain    HPI Diana Garrison is a 47 y.o. female.   Diana DikeSherivian presents with complaints of right foot achilles pain which has been worsening since yesterday. It has been a gradual onset, noticed yesterday am. Worse while at work yesterday, she works as a LawyerCNA. This morning when she woke she felt like she couldn't move her foot or bear weight due to pain. Took 800mg  ibuprofen at 0500 which did not help. 9/10 pain. Denies numbness or tingling. No recent trauma, injury, antibiotic or other new medication use. Denies  Any previous similar.     ROS per HPI.      History reviewed. No pertinent past medical history.  Patient Active Problem List   Diagnosis Date Noted  . Spider veins of both lower extremities 10/17/2016  . Back pain 09/04/2016  . Menorrhagia 06/06/2013  . High BMI 06/06/2013  . Smoker 06/06/2013    Past Surgical History:  Procedure Laterality Date  . CESAREAN SECTION    . GANGLION CYST EXCISION     x2    OB History    Gravida  3   Para  3   Term  2   Preterm  1   AB  0   Living  2     SAB  0   TAB  0   Ectopic  0   Multiple  0   Live Births  1            Home Medications    Prior to Admission medications   Medication Sig Start Date End Date Taking? Authorizing Provider  diclofenac (VOLTAREN) 75 MG EC tablet Take 1 tablet (75 mg total) by mouth 2 (two) times daily. 07/05/17   Georgetta HaberBurky, Yoceline Bazar B, NP  gabapentin (NEURONTIN) 100 MG capsule TAKE 1 TO 3 CAPSULES BY MOUTH ONCE DAILY AS NEEDED FOR BACK PAIN 07/18/16   [provider]  ibuprofen (ADVIL,MOTRIN) 800 MG tablet Take 1 tablet (800 mg total) by mouth every 8 (eight) hours as needed. 07/03/17   Weber, Dema SeverinSarah L, PA-C  Multiple Vitamin (MULTIVITAMIN) tablet Take 1 tablet by mouth daily.    [provider]  Multiple  Vitamins-Minerals (CENTRUM ADULTS) TABS Take 1 tablet by mouth daily. 11/08/16   Weber, Dema SeverinSarah L, PA-C  Omega-3 Fatty Acids (FISH OIL) 1000 MG CAPS Take 1 capsule (1,000 mg total) by mouth daily. 09/15/16   Valarie ConesWeber, Dema SeverinSarah L, PA-C  cetirizine (ZYRTEC ALLERGY) 10 MG tablet Take 1 tablet (10 mg total) by mouth daily. 08/21/12 10/04/14  Calvert Cantorizwan, Saima, MD    Family History Family History  Problem Relation Age of Onset  . Cirrhosis Father   . Pulmonary embolism Sister     Social History Social History   Tobacco Use  . Smoking status: Current Every Day Smoker    Packs/day: 0.50    Years: 7.00    Pack years: 3.50  . Smokeless tobacco: Never Used  Substance Use Topics  . Alcohol use: Yes    Comment: 4 drinks a week  . Drug use: No     Allergies   Patient has no known allergies.   Review of Systems Review of Systems   Physical Exam Triage Vital Signs ED Triage Vitals  Enc Vitals Group     BP 07/05/17 1101 121/77  Pulse Rate 07/05/17 1101 74     Resp 07/05/17 1101 18     Temp 07/05/17 1101 98.4 F (36.9 C)     Temp src --      SpO2 07/05/17 1101 100 %     Weight --      Height --      Head Circumference --      Peak Flow --      Pain Score 07/05/17 1100 8     Pain Loc --      Pain Edu? --      Excl. in GC? --    No data found.  Updated Vital Signs BP 121/77   Pulse 74   Temp 98.4 F (36.9 C)   Resp 18   SpO2 100%   Visual Acuity Right Eye Distance:   Left Eye Distance:   Bilateral Distance:    Right Eye Near:   Left Eye Near:    Bilateral Near:     Physical Exam  Constitutional: She is oriented to person, place, and time. She appears well-developed and well-nourished. No distress.  Cardiovascular: Normal rate, regular rhythm and normal heart sounds.  Pulmonary/Chest: Effort normal and breath sounds normal.  Musculoskeletal:       Right ankle: She exhibits normal range of motion. Achilles tendon exhibits pain. Achilles tendon exhibits no defect and  normal Thompson's test results.  Right achilles with significant tenderness, without bruising, bulging or defect palpable; full flexion and dorsiflexion of ankle/foot; negative Thompson's test with flexion noted with calf squeeze; ambulatory but with pain; sensation intact; strong pedal pulse; without ankle or calcaneous pain  Neurological: She is alert and oriented to person, place, and time.  Skin: Skin is warm and dry.     UC Treatments / Results  Labs (all labs ordered are listed, but only abnormal results are displayed) Labs Reviewed - No data to display  EKG None Radiology No results found.  Procedures Procedures (including critical care time)  Medications Ordered in UC Medications - No data to display   Initial Impression / Assessment and Plan / UC Course  I have reviewed the triage vital signs and the nursing notes.  Pertinent labs & imaging results that were available during my care of the patient were reviewed by me and considered in my medical decision making (see chart for details).     Does not appear to have an achilles rupture based on history and physical exam, likely tendonitis. Cam walker provider for support, nsaids, ice, limited activity until pain improves. Follow up with ortho and/or PCP for recheck and work clearance. Patient verbalized understanding and agreeable to plan.    Final Clinical Impressions(s) / UC Diagnoses   Final diagnoses:  Achilles tendinitis of right lower extremity    ED Discharge Orders        Ordered    diclofenac (VOLTAREN) 75 MG EC tablet  2 times daily     07/05/17 1136       Controlled Substance Prescriptions [place] Controlled Substance Registry consulted? Not Applicable   Georgetta Haber, NP 07/05/17 1145

## 2017-07-08 ENCOUNTER — Other Ambulatory Visit: Payer: Self-pay

## 2017-07-08 ENCOUNTER — Encounter (HOSPITAL_COMMUNITY): Payer: Self-pay | Admitting: *Deleted

## 2017-07-08 ENCOUNTER — Ambulatory Visit (HOSPITAL_COMMUNITY)
Admission: EM | Admit: 2017-07-08 | Discharge: 2017-07-08 | Disposition: A | Discharge status: Home / Self Care | Payer: BLUE CROSS/BLUE SHIELD | Attending: Urgent Care | Admitting: Urgent Care

## 2017-07-08 DIAGNOSIS — M7661 Achilles tendinitis, right leg: Secondary | ICD-10-CM

## 2017-07-08 MED ORDER — METHYLPREDNISOLONE ACETATE 80 MG/ML IJ SUSP
INTRAMUSCULAR | Status: AC
  Filled 2017-07-08: qty 1

## 2017-07-08 MED ORDER — METHYLPREDNISOLONE ACETATE 80 MG/ML IJ SUSP
80.0000 mg | Freq: Once | INTRAMUSCULAR | Status: AC
  Administered 2017-07-08: 80 mg via INTRAMUSCULAR

## 2017-07-08 NOTE — ED Provider Notes (Signed)
  MRN: [id number] DOB: 03/26/1970  Subjective:   Diana Garrison is a 47 y.o. female presenting for 5-day history of severe constant right-sided Achilles tendon pain.  Since last office visit here was on July 05, 2017, was placed in cam walker boot and started on diclofenac.  Patient states that her Achilles tendon pain is worse, also feels that the CAM Walker boot worsens her pain.  She denies fever, redness, swelling, traumatic event, hearing a pop, falls.  Denies history of gout, ankle pain.  No current facility-administered medications for this encounter.   Current Outpatient Medications:  .  diclofenac (VOLTAREN) 75 MG EC tablet, Take 1 tablet (75 mg total) by mouth 2 (two) times daily., Disp: 20 tablet, Rfl: 0 .  gabapentin (NEURONTIN) 100 MG capsule, TAKE 1 TO 3 CAPSULES BY MOUTH ONCE DAILY AS NEEDED FOR BACK PAIN, Disp: , Rfl:  .  ibuprofen (ADVIL,MOTRIN) 800 MG tablet, Take 1 tablet (800 mg total) by mouth every 8 (eight) hours as needed., Disp: 30 tablet, Rfl: 0 .  Multiple Vitamin (MULTIVITAMIN) tablet, Take 1 tablet by mouth daily., Disp: , Rfl:  .  Multiple Vitamins-Minerals (CENTRUM ADULTS) TABS, Take 1 tablet by mouth daily., Disp: 90 tablet, Rfl: 4 .  Omega-3 Fatty Acids (FISH OIL) 1000 MG CAPS, Take 1 capsule (1,000 mg total) by mouth daily., Disp: 90 capsule, Rfl: 4    No Known Allergies   History reviewed. No pertinent past medical history.    Past Surgical History:  Procedure Laterality Date  . CESAREAN SECTION    . GANGLION CYST EXCISION     x2    Objective:   Vitals: BP 109/66 (BP Location: Left Arm)   Pulse 82   Temp 98.2 F (36.8 C) (Oral)   LMP 07/05/2017   SpO2 99%   Physical Exam  Constitutional: She is oriented to person, place, and time. She appears well-developed and well-nourished.  Cardiovascular: Normal rate.  Pulmonary/Chest: Effort normal.  Musculoskeletal:       Right ankle: She exhibits decreased range of motion. She exhibits no  swelling, no ecchymosis, no deformity and no laceration. No tenderness. Achilles tendon exhibits pain (with associated warmth but no erythema). Achilles tendon exhibits no defect and normal Thompson's test results.  Neurological: She is alert and oriented to person, place, and time.    Assessment and Plan :   Achilles tendinitis of right lower extremity  Counseled patient that she really does need to follow-up with an orthopedist.  She was instructed to do so at her last visit but has not called the clinic.  Recommend that she stop using the cam walker, use a Ace bandage and be nonweightbearing for her right foot.    Wallis Bamberg, [place] 07/08/17 1334

## 2017-07-08 NOTE — ED Triage Notes (Addendum)
Per pt she was just here on Thursday for her leg pain, per pt her pain has gotten worse. Per pt the pain meds they put her on is not working because she feels no relief. Per pt her rt leg is throbbing and it's causing her to wake up at night

## 2017-07-08 NOTE — Discharge Instructions (Addendum)
Please try to set up an appointment with an orthopedist through your PCP or by calling an orthopedist practice within your insurance policy.  Below is a list of clinics that may be options for you.  [place], [place], [facility]

## 2017-07-10 DIAGNOSIS — M7661 Achilles tendinitis, right leg: Secondary | ICD-10-CM | POA: Diagnosis not present

## 2017-08-02 ENCOUNTER — Encounter: Payer: Self-pay | Admitting: Family Medicine

## 2017-09-26 ENCOUNTER — Other Ambulatory Visit: Payer: Self-pay | Admitting: Physician Assistant

## 2017-09-26 DIAGNOSIS — Z1231 Encounter for screening mammogram for malignant neoplasm of breast: Secondary | ICD-10-CM

## 2017-10-09 ENCOUNTER — Other Ambulatory Visit: Payer: Self-pay

## 2017-10-09 ENCOUNTER — Encounter: Payer: Self-pay | Admitting: Physician Assistant

## 2017-10-09 ENCOUNTER — Ambulatory Visit (INDEPENDENT_AMBULATORY_CARE_PROVIDER_SITE_OTHER): Payer: BLUE CROSS/BLUE SHIELD | Admitting: Physician Assistant

## 2017-10-09 ENCOUNTER — Other Ambulatory Visit: Payer: Self-pay | Admitting: Physician Assistant

## 2017-10-09 VITALS — BP 110/70 | HR 111 | Temp 98.8°F | Resp 18 | Ht 66.93 in | Wt 299.0 lb

## 2017-10-09 DIAGNOSIS — Z130 Encounter for screening for diseases of the blood and blood-forming organs and certain disorders involving the immune mechanism: Secondary | ICD-10-CM | POA: Diagnosis not present

## 2017-10-09 DIAGNOSIS — Z131 Encounter for screening for diabetes mellitus: Secondary | ICD-10-CM

## 2017-10-09 DIAGNOSIS — F17200 Nicotine dependence, unspecified, uncomplicated: Secondary | ICD-10-CM | POA: Diagnosis not present

## 2017-10-09 DIAGNOSIS — Z113 Encounter for screening for infections with a predominantly sexual mode of transmission: Secondary | ICD-10-CM | POA: Diagnosis not present

## 2017-10-09 DIAGNOSIS — E7800 Pure hypercholesterolemia, unspecified: Secondary | ICD-10-CM | POA: Diagnosis not present

## 2017-10-09 DIAGNOSIS — N6320 Unspecified lump in the left breast, unspecified quadrant: Secondary | ICD-10-CM

## 2017-10-09 DIAGNOSIS — Z6841 Body Mass Index (BMI) 40.0 and over, adult: Secondary | ICD-10-CM

## 2017-10-09 DIAGNOSIS — Z0000 Encounter for general adult medical examination without abnormal findings: Secondary | ICD-10-CM

## 2017-10-09 DIAGNOSIS — Z8619 Personal history of other infectious and parasitic diseases: Secondary | ICD-10-CM | POA: Diagnosis not present

## 2017-10-09 DIAGNOSIS — N926 Irregular menstruation, unspecified: Secondary | ICD-10-CM

## 2017-10-09 DIAGNOSIS — Z1389 Encounter for screening for other disorder: Secondary | ICD-10-CM

## 2017-10-09 DIAGNOSIS — R52 Pain, unspecified: Secondary | ICD-10-CM

## 2017-10-09 DIAGNOSIS — Z1329 Encounter for screening for other suspected endocrine disorder: Secondary | ICD-10-CM

## 2017-10-09 DIAGNOSIS — Z13228 Encounter for screening for other metabolic disorders: Secondary | ICD-10-CM

## 2017-10-09 DIAGNOSIS — Z114 Encounter for screening for human immunodeficiency virus [HIV]: Secondary | ICD-10-CM

## 2017-10-09 NOTE — Patient Instructions (Signed)
     IF you received an x-ray today, you will receive an invoice from [facility]. Please contact [facility] at [phone] with questions or concerns regarding your invoice.   IF you received labwork today, you will receive an invoice from [facility]. Please contact [facility] at [phone] with questions or concerns regarding your invoice.   Our billing staff will not be able to assist you with questions regarding bills from these companies.  You will be contacted with the lab results as soon as they are available. The fastest way to get your results is to activate your My Chart account. Instructions are located on the last page of this paperwork. If you have not heard from us regarding the results in 2 weeks, please contact this office.     

## 2017-10-09 NOTE — Progress Notes (Signed)
Diana Garrison  MRN: [id number] DOB: 11-08-1970  PCP: Mancel Bale, PA-C   Chief Complaint  Patient presents with  . Annual Exam    Subjective:  Pt presents to clinic for a CPE.  Lump on the left breast - 2 weeks ago - feels long like a vessel.  No pain. No nipple discharge.  No change in what breast looks liked.  Reviewed last mammogram done 1 year ago as normal.  Patient would like to stop smoking.  She has tried Chantix in the past but it gave her side effects.  LMP - 7/2 - bleeding since then - started with a regular menses then 7/8-19 spotting and then again on 7/19 another regular flow menses through 7/24.  She typically has regular menses.    Last dental exam: once a year Last vision exam: wears glasses - appt next week Last pap: 04/2017 Last mammo: 1 year ago  Vaccinations - UTD  Typical meals for patient: 3 meals and snacks - homecooked - limits fried foods Typical beverage choices: water Exercises: 7 times per week for 30 minutes and 50 sit-ups every night Sleeps: 7 hrs per night and sleeping well   Patient Active Problem List   Diagnosis Date Noted  . Spider veins of both lower extremities 10/17/2016  . Back pain 09/04/2016  . Hx of syphilis 04/06/2016  . Menorrhagia 06/06/2013  . Morbid obesity with BMI of 40.0-44.9, adult ([facility]) 06/06/2013  . Smoker 06/06/2013    Patient Care Team: Mittie Bodo as PCP - General (Physician Assistant)  Review of Systems  Constitutional: Negative.   HENT: Negative.   Eyes: Negative.   Respiratory: Negative.   Cardiovascular: Negative.   Gastrointestinal: Negative.   Endocrine: Negative.   Genitourinary: Negative.   Musculoskeletal: Negative.   Skin: Negative.   Allergic/Immunologic: Negative.   Neurological: Negative.   Hematological: Negative.   Psychiatric/Behavioral: Negative.      Current Outpatient Medications on File Prior to Visit  Medication Sig Dispense Refill  . Multiple Vitamins-Minerals  (CENTRUM ADULTS) TABS Take 1 tablet by mouth daily. 90 tablet 4  . Omega-3 Fatty Acids (FISH OIL) 1000 MG CAPS Take 1 capsule (1,000 mg total) by mouth daily. 90 capsule 4   No current facility-administered medications on file prior to visit.     No Known Allergies  Social History   Socioeconomic History  . Marital status: Single    Spouse name: Not on file  . Number of children: 2  . Years of education: Not on file  . Highest education level: Not on file  Occupational History  . Occupation: CNA    Comment: [employer]  Social Needs  . Financial resource strain: Not on file  . Food insecurity:    Worry: Not on file    Inability: Not on file  . Transportation needs:    Medical: Not on file    Non-medical: Not on file  Tobacco Use  . Smoking status: Current Every Day Smoker    Packs/day: 1.00    Start date: 10/10/2007  . Smokeless tobacco: Never Used  Substance and Sexual Activity  . Alcohol use: Yes    Comment: 4 drinks a week - red wine  . Drug use: Yes    Types: Marijuana  . Sexual activity: Yes    Partners: Male    Birth control/protection: None, Condom    Comment: 10/04/14: Not sexually active x5 mo due to spouse having had a CVA  Lifestyle  .  Physical activity:    Days per week: Not on file    Minutes per session: Not on file  . Stress: Not on file  Relationships  . Social connections:    Talks on phone: Not on file    Gets together: Not on file    Attends religious service: Not on file    Active member of club or organization: Not on file    Attends meetings of clubs or organizations: Not on file    Relationship status: Not on file  Other Topics Concern  . Not on file  Social History Narrative   Lives with daughter       Seatbelt - 100%      Guns in home - no    Past Surgical History:  Procedure Laterality Date  . CESAREAN SECTION    . GANGLION CYST EXCISION     x2    Family History  Problem Relation Age of Onset  . Cirrhosis Father   .  Pulmonary embolism Sister      Objective:  BP 110/70   Pulse (!) 111   Temp 98.8 F (37.1 C) (Oral)   Resp 18   Ht 5' 6.93" (1.7 m)   Wt 299 lb (135.6 kg)   LMP 09/28/2017   SpO2 98%   BMI 46.93 kg/m   Physical Exam  Constitutional: She is oriented to person, place, and time. She appears well-developed and well-nourished.  HENT:  Head: Normocephalic and atraumatic.  Right Ear: Hearing, tympanic membrane, external ear and ear canal normal.  Left Ear: Hearing, tympanic membrane, external ear and ear canal normal.  Nose: Nose normal.  Mouth/Throat: Uvula is midline, oropharynx is clear and moist and mucous membranes are normal.  Eyes: Pupils are equal, round, and reactive to light. Conjunctivae, EOM and lids are normal. Right eye exhibits no discharge. Left eye exhibits no discharge.  Neck: Trachea normal and normal range of motion. Neck supple. No thyroid mass and no thyromegaly present.  Cardiovascular: Normal rate, regular rhythm and normal heart sounds.  No murmur heard. Pulmonary/Chest: Effort normal and breath sounds normal. She has no wheezes. Right breast exhibits no inverted nipple, no mass, no nipple discharge, no skin change and no tenderness. Left breast exhibits mass. Left breast exhibits no inverted nipple, no nipple discharge, no skin change and no tenderness.    Abdominal: Soft. Normal appearance and bowel sounds are normal. There is no tenderness.  Musculoskeletal: Normal range of motion.  Lymphadenopathy:       Head (right side): No tonsillar, no preauricular, no posterior auricular and no occipital adenopathy present.       Head (left side): No tonsillar, no preauricular, no posterior auricular and no occipital adenopathy present.    She has no cervical adenopathy.       Right: No supraclavicular adenopathy present.       Left: No supraclavicular adenopathy present.  Neurological: She is alert and oriented to person, place, and time. She has normal strength and  normal reflexes.  Skin: Skin is warm, dry and intact.  Psychiatric: She has a normal mood and affect. Her speech is normal and behavior is normal. Judgment and thought content normal.  Vitals reviewed.   Wt Readings from Last 3 Encounters:  10/09/17 299 lb (135.6 kg)  04/18/17 298 lb 9.6 oz (135.4 kg)  12/25/16 283 lb 9.6 oz (128.6 kg)     Visual Acuity Screening   Right eye Left eye Both eyes  Without correction:  With correction: '20/25 20/20 20/20 '$    Assessment and Plan :  Annual physical exam  History of trichomoniasis - Plan: GC/Chlamydia Probe Amp, Trichomonas vaginalis, RNA  Screening for deficiency anemia - Plan: CBC with Differential/Platelet  Elevated LDL cholesterol level - Plan: Lipid panel  Morbid obesity with BMI of 40.0-44.9, adult ([facility]) - Plan: Hemoglobin A1c, Lipid panel, TSH  Screening for diabetes mellitus - Plan: Hemoglobin A1c  Screening for metabolic disorder - Plan: CMP14+EGFR  Screening for thyroid disorder - Plan: TSH  Screening for blood or protein in urine - Plan: Urinalysis, dipstick only  Screen for STD (sexually transmitted disease) - Plan: GC/Chlamydia Probe Amp, RPR, HIV antibody  Encounter for screening for HIV - Plan: HIV antibody  Left breast lump - Plan: MM DIAG BREAST TOMO BILATERAL, CANCELED: MM Digital Diagnostic Bilat -I suspect this is not anything abnormal to be worried about but they will need a diagnostic at the breast center.  Question calcification.  Smoker - wellbutrin Rx for patient and d/w pt how to take - otheroptions discussed with patient  Irregular menses - check of STI - d/w pt to watch and monitor until next cycle this could be resultant of perimenopausal time - if this continues to happen she may need an [place] to check uterine lining  Otherwise check labs associated with physical exam.  Anticipatory guidance given.  Windell Hummingbird PA-C  [facility] 10/13/2017 6:07 PM  Please  note: Portions of this report may have been transcribed using dragon voice recognition software. Every effort was made to ensure accuracy; however, inadvertent computerized transcription errors may be present.

## 2017-10-10 ENCOUNTER — Encounter: Payer: Self-pay | Admitting: Physician Assistant

## 2017-10-10 ENCOUNTER — Telehealth: Payer: Self-pay | Admitting: Physician Assistant

## 2017-10-10 LAB — CBC WITH DIFFERENTIAL/PLATELET
Basophils Absolute: 0 10*3/uL (ref 0.0–0.2)
Basos: 0 %
EOS (ABSOLUTE): 0.1 10*3/uL (ref 0.0–0.4)
EOS: 1 %
HEMATOCRIT: 42 % (ref 34.0–46.6)
HEMOGLOBIN: 13.5 g/dL (ref 11.1–15.9)
IMMATURE GRANS (ABS): 0 10*3/uL (ref 0.0–0.1)
Immature Granulocytes: 0 %
LYMPHS ABS: 2.2 10*3/uL (ref 0.7–3.1)
LYMPHS: 23 %
MCH: 30.5 pg (ref 26.6–33.0)
MCHC: 32.1 g/dL (ref 31.5–35.7)
MCV: 95 fL (ref 79–97)
MONOCYTES: 6 %
Monocytes Absolute: 0.6 10*3/uL (ref 0.1–0.9)
Neutrophils Absolute: 6.5 10*3/uL (ref 1.4–7.0)
Neutrophils: 70 %
Platelets: 354 10*3/uL (ref 150–450)
RBC: 4.43 x10E6/uL (ref 3.77–5.28)
RDW: 13.2 % (ref 12.3–15.4)
WBC: 9.5 10*3/uL (ref 3.4–10.8)

## 2017-10-10 LAB — CMP14+EGFR
ALBUMIN: 4.4 g/dL (ref 3.5–5.5)
ALK PHOS: 94 IU/L (ref 39–117)
ALT: 11 IU/L (ref 0–32)
AST: 14 IU/L (ref 0–40)
Albumin/Globulin Ratio: 1.8 (ref 1.2–2.2)
BUN / CREAT RATIO: 9 (ref 9–23)
BUN: 7 mg/dL (ref 6–24)
Bilirubin Total: 0.3 mg/dL (ref 0.0–1.2)
CALCIUM: 9.6 mg/dL (ref 8.7–10.2)
CO2: 22 mmol/L (ref 20–29)
CREATININE: 0.81 mg/dL (ref 0.57–1.00)
Chloride: 104 mmol/L (ref 96–106)
GFR, EST AFRICAN AMERICAN: 101 mL/min/{1.73_m2} (ref >59)
GFR, EST NON AFRICAN AMERICAN: 87 mL/min/{1.73_m2} (ref >59)
GLOBULIN, TOTAL: 2.5 g/dL (ref 1.5–4.5)
Glucose: 84 mg/dL (ref 65–99)
Potassium: 4.6 mmol/L (ref 3.5–5.2)
Sodium: 140 mmol/L (ref 134–144)
TOTAL PROTEIN: 6.9 g/dL (ref 6.0–8.5)

## 2017-10-10 LAB — LIPID PANEL
CHOL/HDL RATIO: 6.4 ratio — AB (ref 0.0–4.4)
Cholesterol, Total: 283 mg/dL — ABNORMAL HIGH (ref 100–199)
HDL: 44 mg/dL (ref >39)
LDL CALC: 197 mg/dL — AB (ref 0–99)
Triglycerides: 208 mg/dL — ABNORMAL HIGH (ref 0–149)
VLDL CHOLESTEROL CAL: 42 mg/dL — AB (ref 5–40)

## 2017-10-10 LAB — URINALYSIS, DIPSTICK ONLY
Bilirubin, UA: NEGATIVE
Glucose, UA: NEGATIVE
Ketones, UA: NEGATIVE
Leukocytes, UA: NEGATIVE
Nitrite, UA: NEGATIVE
Protein, UA: NEGATIVE
RBC, UA: NEGATIVE
Specific Gravity, UA: 1.013 (ref 1.005–1.030)
Urobilinogen, Ur: 0.2 mg/dL (ref 0.2–1.0)
pH, UA: 6.5 (ref 5.0–7.5)

## 2017-10-10 LAB — HEMOGLOBIN A1C
Est. average glucose Bld gHb Est-mCnc: 114 mg/dL
Hgb A1c MFr Bld: 5.6 % (ref 4.8–5.6)

## 2017-10-10 LAB — RPR: RPR Ser Ql: NONREACTIVE

## 2017-10-10 LAB — TSH: TSH: 1.02 u[IU]/mL (ref 0.450–4.500)

## 2017-10-10 LAB — HIV ANTIBODY (ROUTINE TESTING W REFLEX): HIV Screen 4th Generation wRfx: NONREACTIVE

## 2017-10-10 MED ORDER — IBUPROFEN 800 MG PO TABS: 800.0000 mg | ORAL_TABLET | Freq: Three times a day (TID) | ORAL | 1 refills | Status: DC | PRN

## 2017-10-10 NOTE — Telephone Encounter (Signed)
Copied from CRM [id number]. Topic: Referral - Request >> Oct 10, 2017  9:30 AM Luanna Coleawoud, Jessica L wrote: Reason for CRM: pt called and stated that she needed a order put in for a normal mammogram. The breast center in  would like her to have a diagnostic mammogram instead. Pt does not want to do this. Pt is requesting a call back from weber. Pt was seen 10/09/17. Please advise [phone]#[phone]

## 2017-10-10 NOTE — Telephone Encounter (Signed)
Patient is calling and requesting a call back from Diana LennertSarah Weber today if at all possible. Please advise.

## 2017-10-10 NOTE — Telephone Encounter (Signed)
pls advise

## 2017-10-11 ENCOUNTER — Telehealth: Payer: Self-pay | Admitting: Physician Assistant

## 2017-10-11 ENCOUNTER — Other Ambulatory Visit: Payer: Self-pay | Admitting: Physician Assistant

## 2017-10-11 ENCOUNTER — Encounter: Payer: Self-pay | Admitting: Physician Assistant

## 2017-10-11 DIAGNOSIS — N6320 Unspecified lump in the left breast, unspecified quadrant: Secondary | ICD-10-CM

## 2017-10-11 LAB — GC/CHLAMYDIA PROBE AMP
CHLAMYDIA, DNA PROBE: NEGATIVE
Neisseria gonorrhoeae by PCR: NEGATIVE

## 2017-10-11 LAB — TRICHOMONAS VAGINALIS, PROBE AMP: Trich vag by NAA: NEGATIVE

## 2017-10-11 NOTE — Telephone Encounter (Signed)
Copied from CRM [id number]. Topic: General - Other >> Oct 11, 2017  2:25 PM Percival SpanishKennedy, Cheryl W wrote:  An order was put in Epic for Mammo and [place] and is nned to be signed off by a doctor. Patient has been waiting to get these done and would like to have these test done asap

## 2017-10-11 NOTE — Telephone Encounter (Signed)
pls advise

## 2017-10-13 ENCOUNTER — Encounter: Payer: Self-pay | Admitting: Physician Assistant

## 2017-10-13 MED ORDER — BUPROPION HCL ER (SR) 150 MG PO TB12: 150.0000 mg | ORAL_TABLET | Freq: Two times a day (BID) | ORAL | 1 refills | Status: DC

## 2017-10-13 MED ORDER — VALACYCLOVIR HCL 1 G PO TABS: ORAL_TABLET | ORAL | 1 refills | Status: DC

## 2017-10-13 NOTE — Telephone Encounter (Signed)
On exam of patient I did not think she needed a diagnostic mammogram but if that is the protocol for the breast center than I will sign the order for that to be done.

## 2017-10-14 ENCOUNTER — Encounter: Payer: Self-pay | Admitting: Physician Assistant

## 2017-10-17 ENCOUNTER — Ambulatory Visit
Admission: RE | Admit: 2017-10-17 | Discharge: 2017-10-17 | Disposition: A | Discharge status: Home / Self Care | Payer: BLUE CROSS/BLUE SHIELD | Source: Ambulatory Visit | Attending: Physician Assistant | Admitting: Physician Assistant

## 2017-10-17 DIAGNOSIS — N6489 Other specified disorders of breast: Secondary | ICD-10-CM | POA: Diagnosis not present

## 2017-10-17 DIAGNOSIS — R928 Other abnormal and inconclusive findings on diagnostic imaging of breast: Secondary | ICD-10-CM | POA: Diagnosis not present

## 2017-10-17 DIAGNOSIS — N6320 Unspecified lump in the left breast, unspecified quadrant: Secondary | ICD-10-CM

## 2017-10-17 MED ORDER — ROSUVASTATIN CALCIUM 20 MG PO TABS: 20.0000 mg | ORAL_TABLET | Freq: Every day | ORAL | 1 refills | Status: DC

## 2017-10-17 NOTE — Telephone Encounter (Signed)
Diagnostic already ordered.

## 2018-01-30 ENCOUNTER — Encounter (HOSPITAL_COMMUNITY): Payer: Self-pay | Admitting: Emergency Medicine

## 2018-01-30 ENCOUNTER — Ambulatory Visit (HOSPITAL_COMMUNITY)
Admission: EM | Admit: 2018-01-30 | Discharge: 2018-01-30 | Disposition: A | Discharge status: Home / Self Care | Payer: BLUE CROSS/BLUE SHIELD | Attending: Family Medicine | Admitting: Family Medicine

## 2018-01-30 ENCOUNTER — Other Ambulatory Visit: Payer: Self-pay

## 2018-01-30 DIAGNOSIS — R1013 Epigastric pain: Secondary | ICD-10-CM

## 2018-01-30 MED ORDER — ONDANSETRON 8 MG PO TBDP: 8.0000 mg | ORAL_TABLET | Freq: Three times a day (TID) | ORAL | 0 refills | Status: DC | PRN

## 2018-01-30 MED ORDER — OMEPRAZOLE 20 MG PO CPDR: 20.0000 mg | DELAYED_RELEASE_CAPSULE | Freq: Every day | ORAL | 0 refills | Status: DC

## 2018-01-30 NOTE — ED Provider Notes (Signed)
[facility]    CSN: [id number] Arrival date & time: 01/30/18  1616     History   Chief Complaint Chief Complaint  Patient presents with  . Abdominal Pain  . Nausea  . Chills    HPI Diana Garrison is a 47 y.o. female.   Pt states that after lunch at work today she started to not feel right.  Pt complains of hot flashes, cold chills, nausea, and abdominal pain.  Pt has taken no OTC medications.  Patient works as a LawyerCNA.  She had a [place] sandwich and bowl of soup for lunch and within a half an hour started having epigastric pain, chills, hot flashes, and nausea.  She has not had any vomiting.  She continued to feel the epigastric pain and nausea all afternoon.  There is been no diarrhea.     History reviewed. No pertinent past medical history.  Patient Active Problem List   Diagnosis Date Noted  . Spider veins of both lower extremities 10/17/2016  . Back pain 09/04/2016  . Hx of syphilis 04/06/2016  . Menorrhagia 06/06/2013  . Morbid obesity with BMI of 40.0-44.9, adult ([facility]) 06/06/2013  . Smoker 06/06/2013    Past Surgical History:  Procedure Laterality Date  . CESAREAN SECTION    . GANGLION CYST EXCISION     x2    OB History    Gravida  3   Para  3   Term  2   Preterm  1   AB  0   Living  2     SAB  0   TAB  0   Ectopic  0   Multiple  0   Live Births  1            Home Medications    Prior to Admission medications   Medication Sig Start Date End Date Taking? Authorizing Provider  buPROPion (WELLBUTRIN SR) 150 MG 12 hr tablet Take 1 tablet (150 mg total) by mouth 2 (two) times daily. 10/13/17  Yes Weber, Dema SeverinSarah L, PA-C  Multiple Vitamins-Minerals (CENTRUM ADULTS) TABS Take 1 tablet by mouth daily. 11/08/16  Yes Weber, Sarah L, PA-C  rosuvastatin (CRESTOR) 20 MG tablet Take 1 tablet (20 mg total) by mouth daily. 10/17/17  Yes Weber, Sarah L, PA-C  ibuprofen (ADVIL,MOTRIN) 800 MG tablet Take 1 tablet (800 mg total) by mouth every  8 (eight) hours as needed. 10/10/17   Weber, Dema SeverinSarah L, PA-C  Omega-3 Fatty Acids (FISH OIL) 1000 MG CAPS Take 1 capsule (1,000 mg total) by mouth daily. 09/15/16   Valarie ConesWeber, Dema SeverinSarah L, PA-C  omeprazole (PRILOSEC) 20 MG capsule Take 1 capsule (20 mg total) by mouth daily. 01/30/18   Elvina SidleLauenstein, Zakry Caso, MD  ondansetron (ZOFRAN-ODT) 8 MG disintegrating tablet Take 1 tablet (8 mg total) by mouth every 8 (eight) hours as needed for nausea. 01/30/18   Elvina SidleLauenstein, Laloni Rowton, MD  valACYclovir (VALTREX) 1000 MG tablet Take 2 pills at onset of cold sore and then repeat in 12 hours for each outbreak 10/13/17   Valarie ConesWeber, Dema SeverinSarah L, PA-C    Family History Family History  Problem Relation Age of Onset  . Cirrhosis Father   . Pulmonary embolism Sister     Social History Social History   Tobacco Use  . Smoking status: Current Every Day Smoker    Packs/day: 1.00    Start date: 10/10/2007  . Smokeless tobacco: Never Used  Substance Use Topics  . Alcohol use: Yes    Comment:  4 drinks a week - red wine  . Drug use: Yes    Types: Marijuana     Allergies   Patient has no known allergies.   Review of Systems Review of Systems  Constitutional: Positive for chills and diaphoresis.  Gastrointestinal: Positive for abdominal pain and nausea.  Neurological: Positive for dizziness.     Physical Exam Triage Vital Signs ED Triage Vitals  Enc Vitals Group     BP 01/30/18 1721 (!) 146/95     Pulse Rate 01/30/18 1721 77     Resp --      Temp 01/30/18 1721 98 F (36.7 C)     Temp Source 01/30/18 1721 Oral     SpO2 01/30/18 1721 100 %     Weight --      Height --      Head Circumference --      Peak Flow --      Pain Score 01/30/18 1720 8     Pain Loc --      Pain Edu? --      Excl. in GC? --    No data found.  Updated Vital Signs BP (!) 146/95 (BP Location: Left Arm)   Pulse 77   Temp 98 F (36.7 C) (Oral)   LMP 01/07/2018   SpO2 100%    Physical Exam  Constitutional: She appears well-developed and  well-nourished.  HENT:  Head: Normocephalic and atraumatic.  Mouth/Throat: Oropharynx is clear and moist.  Eyes: Pupils are equal, round, and reactive to light. EOM are normal.  Cardiovascular: Normal rate, regular rhythm and normal heart sounds.  Pulmonary/Chest: Effort normal and breath sounds normal.  Abdominal: Bowel sounds are normal. There is tenderness in the epigastric area.  Neurological: She is alert.  Skin: Skin is warm and dry.  Nursing note and vitals reviewed.    UC Treatments / Results  Labs (all labs ordered are listed, but only abnormal results are displayed) Labs Reviewed - No data to display  EKG None  Radiology No results found.  Procedures Procedures (including critical care time)  Medications Ordered in UC Medications - No data to display  Initial Impression / Assessment and Plan / UC Course  I have reviewed the triage vital signs and the nursing notes.  Pertinent labs & imaging results that were available during my care of the patient were reviewed by me and considered in my medical decision making (see chart for details).     Final Clinical Impressions(s) / UC Diagnoses   Final diagnoses:  Abdominal pain, epigastric   Discharge Instructions   None    ED Prescriptions    Medication Sig Dispense Auth. Provider   omeprazole (PRILOSEC) 20 MG capsule Take 1 capsule (20 mg total) by mouth daily. 7 capsule Elvina Sidle, MD   ondansetron (ZOFRAN-ODT) 8 MG disintegrating tablet Take 1 tablet (8 mg total) by mouth every 8 (eight) hours as needed for nausea. 12 tablet Elvina Sidle, MD     Controlled Substance Prescriptions [place] Controlled Substance Registry consulted? Not Applicable   Elvina Sidle, MD 01/30/18 [phone]

## 2018-01-30 NOTE — ED Triage Notes (Signed)
Pt states that after lunch at work today she started to not feel right.  Pt complains of hot flashes, cold chills, nausea, and abdominal pain.  Pt has taken no OTC medications.

## 2018-03-24 ENCOUNTER — Encounter (HOSPITAL_COMMUNITY): Payer: Self-pay

## 2018-03-24 ENCOUNTER — Ambulatory Visit (HOSPITAL_COMMUNITY)
Admission: EM | Admit: 2018-03-24 | Discharge: 2018-03-24 | Disposition: A | Discharge status: Home / Self Care | Payer: BLUE CROSS/BLUE SHIELD | Attending: Physician Assistant | Admitting: Physician Assistant

## 2018-03-24 DIAGNOSIS — J069 Acute upper respiratory infection, unspecified: Secondary | ICD-10-CM | POA: Diagnosis not present

## 2018-03-24 DIAGNOSIS — J22 Unspecified acute lower respiratory infection: Secondary | ICD-10-CM | POA: Insufficient documentation

## 2018-03-24 MED ORDER — AMOXICILLIN-POT CLAVULANATE 875-125 MG PO TABS: 1.0000 | ORAL_TABLET | Freq: Two times a day (BID) | ORAL | 0 refills | Status: DC

## 2018-03-24 MED ORDER — BENZONATATE 200 MG PO CAPS: 200.0000 mg | ORAL_CAPSULE | Freq: Three times a day (TID) | ORAL | 0 refills | Status: DC | PRN

## 2018-03-24 NOTE — ED Triage Notes (Signed)
Pt presents with persistent cough X 10 days with thick yellow mucus, chest tightness/pain, and fatigue.

## 2018-03-24 NOTE — ED Provider Notes (Signed)
03/24/2018 1:33 PM   DOB: 23-Oct-1970 / MRN: [id number]  SUBJECTIVE:  Diana Garrison is a 48 y.o. female presenting for cough productive of thick green sputum x10 days.  She does not complain of a history of asthma.  She does smoke.  Denies shortness of breath.  She says the pain in her chest occurs when she coughs.  She has No Known Allergies.   She  has no past medical history on file.    She  reports that she has been smoking. She started smoking about 10 years ago. She has been smoking about 1.00 pack per day. She has never used smokeless tobacco. She reports current alcohol use. She reports current drug use. Drug: Marijuana. She  reports being sexually active and has had partner(s) who are Female. She reports using the following methods of birth control/protection: None and Condom. The patient  has a past surgical history that includes Cesarean section and Ganglion cyst excision.  Her family history includes Cirrhosis in her father; Pulmonary embolism in her sister.  Review of Systems  Constitutional: Negative for chills, diaphoresis and fever.  Eyes: Negative.   Respiratory: Negative for cough, hemoptysis, sputum production, shortness of breath and wheezing.   Cardiovascular: Negative for chest pain, orthopnea and leg swelling.  Gastrointestinal: Negative for abdominal pain, blood in stool, constipation, diarrhea, heartburn, melena, nausea and vomiting.  Genitourinary: Negative for flank pain.  Skin: Negative for rash.  Neurological: Negative for dizziness, sensory change, speech change, focal weakness and headaches.    OBJECTIVE:  BP 114/67 (BP Location: Right Arm)   Pulse 88   Temp 99 F (37.2 C) (Oral)   Resp 18   LMP 02/28/2018   SpO2 100%   Wt Readings from Last 3 Encounters:  10/09/17 299 lb (135.6 kg)  04/18/17 298 lb 9.6 oz (135.4 kg)  12/25/16 283 lb 9.6 oz (128.6 kg)   Temp Readings from Last 3 Encounters:  03/24/18 99 F (37.2 C) (Oral)  01/30/18 98 F (36.7 C)  (Oral)  10/09/17 98.8 F (37.1 C) (Oral)   BP Readings from Last 3 Encounters:  03/24/18 114/67  01/30/18 (!) 146/95  10/09/17 110/70   Pulse Readings from Last 3 Encounters:  03/24/18 88  01/30/18 77  10/09/17 (!) 111    Physical Exam Vitals signs reviewed.  Constitutional:      General: She is not in acute distress.    Appearance: She is not diaphoretic.  Eyes:     Pupils: Pupils are equal, round, and reactive to light.  Cardiovascular:     Rate and Rhythm: Normal rate.  Pulmonary:     Effort: Pulmonary effort is normal. No respiratory distress.     Breath sounds: Normal breath sounds. No stridor. No wheezing or rales.  Chest:     Chest wall: No tenderness.  Abdominal:     General: There is no distension.  Skin:    General: Skin is dry.  Neurological:     Mental Status: She is alert and oriented to person, place, and time.     Cranial Nerves: No cranial nerve deficit.     Gait: Gait normal.     No results found for this or any previous visit (from the past 72 hour(s)).  No results found.  ASSESSMENT AND PLAN:   Protracted URI  Lower respiratory tract infection    Discharge Instructions     Start the antibiotic today.  Start the cough pill today as well.  Okay to continue taking  Tylenol 1000 mg every 8 hours as needed for pain and fever.        The patient is advised to call or return to clinic if she does not see an improvement in symptoms, or to seek the care of the closest emergency department if she worsens with the above plan.   Deliah Boston, MHS, PA-C 03/24/2018 1:33 PM   Ofilia Neas, PA-C 03/24/18 1333

## 2018-03-24 NOTE — Discharge Instructions (Signed)
Start the antibiotic today.  Start the cough pill today as well.  Okay to continue taking Tylenol 1000 mg every 8 hours as needed for pain and fever.

## 2018-06-23 ENCOUNTER — Encounter (HOSPITAL_COMMUNITY): Payer: Self-pay

## 2018-06-23 ENCOUNTER — Other Ambulatory Visit: Payer: Self-pay

## 2018-06-23 ENCOUNTER — Ambulatory Visit (HOSPITAL_COMMUNITY)
Admission: EM | Admit: 2018-06-23 | Discharge: 2018-06-23 | Disposition: A | Discharge status: Home / Self Care | Payer: BLUE CROSS/BLUE SHIELD | Attending: Family Medicine | Admitting: Family Medicine

## 2018-06-23 DIAGNOSIS — M25512 Pain in left shoulder: Secondary | ICD-10-CM | POA: Diagnosis not present

## 2018-06-23 MED ORDER — KETOROLAC TROMETHAMINE 30 MG/ML IJ SOLN
INTRAMUSCULAR | Status: AC
  Filled 2018-06-23: qty 1

## 2018-06-23 MED ORDER — KETOROLAC TROMETHAMINE 30 MG/ML IJ SOLN
30.0000 mg | Freq: Once | INTRAMUSCULAR | Status: AC
  Administered 2018-06-23: 11:00:00 30 mg via INTRAMUSCULAR

## 2018-06-23 MED ORDER — PREDNISONE 50 MG PO TABS: 50.0000 mg | ORAL_TABLET | Freq: Every day | ORAL | 0 refills | Status: DC

## 2018-06-23 NOTE — ED Triage Notes (Signed)
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 Pt tired some Aleve and it did not touch the pain. X 3 days of pain.

## 2018-06-23 NOTE — ED Provider Notes (Signed)
[facility]    CSN: [id number] Arrival date & time: 06/23/18  1017     History   Chief Complaint Chief Complaint  Patient presents with  . Shoulder Pain    HPI Diana Garrison is a 48 y.o. female.   48 year old female comes in for 3 day history of left shoulder pain. 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 States pain was mild, and was relieved by aleve. Throughout the next few days, pain has gradually worsened, and now with decreased ROM of the shoulder, and tingling of the fingers. Pain is throbbing in sensation. Denies swelling, erythema. States aleve, tylenol no long relieves the pain.      History reviewed. No pertinent past medical history.  Patient Active Problem List   Diagnosis Date Noted  . Spider veins of both lower extremities 10/17/2016  . Back pain 09/04/2016  . Hx of syphilis 04/06/2016  . Menorrhagia 06/06/2013  . Morbid obesity with BMI of 40.0-44.9, adult (HCC) 06/06/2013  . Smoker 06/06/2013    Past Surgical History:  Procedure Laterality Date  . CESAREAN SECTION    . GANGLION CYST EXCISION     x2    OB History    Gravida  3   Para  3   Term  2   Preterm  1   AB  0   Living  2     SAB  0   TAB  0   Ectopic  0   Multiple  0   Live Births  1            Home Medications    Prior to Admission medications   Medication Sig Start Date End Date Taking? Authorizing Provider  amoxicillin-clavulanate (AUGMENTIN) 875-125 MG tablet Take 1 tablet by mouth every 12 (twelve) hours. 03/24/18   Ofilia Neaslark, Michael L, PA-C  benzonatate (TESSALON) 200 MG capsule Take 1 capsule (200 mg total) by mouth 3 (three) times daily as needed for cough. 03/24/18   Ofilia Neaslark, Michael L, PA-C  buPROPion (WELLBUTRIN SR) 150 MG 12 hr tablet Take 1 tablet (150 mg total) by mouth 2 (two) times daily. 10/13/17   Weber, Dema SeverinSarah L, PA-C  ibuprofen (ADVIL,MOTRIN) 800 MG tablet Take 1 tablet (800 mg total) by mouth  every 8 (eight) hours as needed. 10/10/17   Weber, Dema SeverinSarah L, PA-C  Multiple Vitamins-Minerals (CENTRUM ADULTS) TABS Take 1 tablet by mouth daily. 11/08/16   Weber, Dema SeverinSarah L, PA-C  Omega-3 Fatty Acids (FISH OIL) 1000 MG CAPS Take 1 capsule (1,000 mg total) by mouth daily. 09/15/16   Valarie ConesWeber, Dema SeverinSarah L, PA-C  omeprazole (PRILOSEC) 20 MG capsule Take 1 capsule (20 mg total) by mouth daily. 01/30/18   Elvina SidleLauenstein, Kurt, MD  ondansetron (ZOFRAN-ODT) 8 MG disintegrating tablet Take 1 tablet (8 mg total) by mouth every 8 (eight) hours as needed for nausea. 01/30/18   Elvina SidleLauenstein, Kurt, MD  predniSONE (DELTASONE) 50 MG tablet Take 1 tablet (50 mg total) by mouth daily. 06/23/18   Cathie HoopsYu, Leiyah Maultsby V, PA-C  rosuvastatin (CRESTOR) 20 MG tablet Take 1 tablet (20 mg total) by mouth daily. 10/17/17   Valarie ConesWeber, Dema SeverinSarah L, PA-C  valACYclovir (VALTREX) 1000 MG tablet Take 2 pills at onset of cold sore and then repeat in 12 hours for each outbreak 10/13/17   Valarie ConesWeber, Dema SeverinSarah L, PA-C    Family History Family History  Problem Relation Age of Onset  . Cirrhosis Father   .  Pulmonary embolism Sister     Social History Social History   Tobacco Use  . Smoking status: Current Every Day Smoker    Packs/day: 1.00    Start date: 10/10/2007  . Smokeless tobacco: Never Used  Substance Use Topics  . Alcohol use: Yes    Comment: 4 drinks a week - red wine  . Drug use: Yes    Types: Marijuana     Allergies   Patient has no known allergies.   Review of Systems Review of Systems  Reason unable to perform ROS: See HPI as above.     Physical Exam Triage Vital Signs ED Triage Vitals  Enc Vitals Group     BP 06/23/18 1029 124/86     Pulse Rate 06/23/18 1029 92     Resp 06/23/18 1029 18     Temp 06/23/18 1029 98.8 F (37.1 C)     Temp Source 06/23/18 1029 Oral     SpO2 06/23/18 1029 98 %     Weight 06/23/18 1031 280 lb (127 kg)     Height --      Head Circumference --      Peak Flow --      Pain Score 06/23/18 1028 10     Pain Loc --       Pain Edu? --      Excl. in GC? --    No data found.  Updated Vital Signs BP 124/86 (BP Location: Right Arm)   Pulse 92   Temp 98.8 F (37.1 C) (Oral)   Resp 18   Wt 280 lb (127 kg)   LMP 06/03/2018   SpO2 98%   BMI 43.95 kg/m   Physical Exam Constitutional:      General: She is not in acute distress.    Appearance: She is well-developed. She is not diaphoretic.  HENT:     Head: Normocephalic and atraumatic.  Eyes:     Conjunctiva/sclera: Conjunctivae normal.     Pupils: Pupils are equal, round, and reactive to light.  Musculoskeletal:     Comments: No obvious swelling, erythema, deformity. Tenderness to palpation along anterior and posterior shoulder. Abduction to 90 degrees, able to internal and external rotation. Same ROM passively. Full ROM of neck, elbow. Strength of shoulder deferred. Strength normal and equal of neck. Unable to assess elbow strength given limited ROM of shoulder. Grip strength normal and equal bilaterally. Sensation intact and equal bilaterally. Radial pulse 2+, cap refill < 2s  Neurological:     Mental Status: She is alert and oriented to person, place, and time.      UC Treatments / Results  Labs (all labs ordered are listed, but only abnormal results are displayed) Labs Reviewed - No data to display  EKG None  Radiology No results found.  Procedures Procedures (including critical care time)  Medications Ordered in UC Medications  ketorolac (TORADOL) 30 MG/ML injection 30 mg (30 mg Intramuscular Given 06/23/18 1105)    Initial Impression / Assessment and Plan / UC Course  I have reviewed the triage vital signs and the nursing notes.  Pertinent labs & imaging results that were available during my care of the patient were reviewed by me and considered in my medical decision making (see chart for details).    Toradol injection in office today. Prednisone as directed for possible tendinitis. Ice compress, rest. Patient to follow up  with PCP/orthopedics if symptoms not improving for further evaluation needed.  Final Clinical Impressions(s) / UC  Diagnoses   Final diagnoses:  Acute pain of left shoulder    ED Prescriptions    Medication Sig Dispense Auth. Provider   predniSONE (DELTASONE) 50 MG tablet Take 1 tablet (50 mg total) by mouth daily. 5 tablet Threasa Alpha, PA-C 06/23/18 1215

## 2018-06-23 NOTE — Discharge Instructions (Signed)
Toradol injection in office today. Start prednisone as directed. Ice compress, rest. Shoulder range of motion exercise as discussed. Follow up with PCP/orthopedics for further evaluation if symptoms not improving.

## 2018-07-06 ENCOUNTER — Ambulatory Visit (HOSPITAL_COMMUNITY)
Admission: EM | Admit: 2018-07-06 | Discharge: 2018-07-06 | Disposition: A | Discharge status: Home / Self Care | Payer: BLUE CROSS/BLUE SHIELD | Attending: Family Medicine | Admitting: Family Medicine

## 2018-07-06 ENCOUNTER — Encounter (HOSPITAL_COMMUNITY): Payer: Self-pay | Admitting: Emergency Medicine

## 2018-07-06 DIAGNOSIS — R109 Unspecified abdominal pain: Secondary | ICD-10-CM | POA: Diagnosis not present

## 2018-07-06 MED ORDER — KETOROLAC TROMETHAMINE 30 MG/ML IJ SOLN
30.0000 mg | Freq: Once | INTRAMUSCULAR | Status: AC
  Administered 2018-07-06: 30 mg via INTRAMUSCULAR

## 2018-07-06 MED ORDER — MELOXICAM 7.5 MG PO TABS: 7.5000 mg | ORAL_TABLET | Freq: Every day | ORAL | 0 refills | Status: DC

## 2018-07-06 MED ORDER — KETOROLAC TROMETHAMINE 30 MG/ML IJ SOLN
INTRAMUSCULAR | Status: AC
  Filled 2018-07-06: qty 1

## 2018-07-06 MED ORDER — METHOCARBAMOL 500 MG PO TABS: 500.0000 mg | ORAL_TABLET | Freq: Two times a day (BID) | ORAL | 0 refills | Status: DC

## 2018-07-06 NOTE — Discharge Instructions (Signed)
Start Mobic. Do not take ibuprofen (motrin/advil)/ naproxen (aleve) while on mobic. Robaxin as needed, this can make you drowsy, so do not take if you are going to drive, operate heavy machinery, or make important decisions. Ice/heat compresses as needed. Follow up here or with PCP if symptoms worsen, changes for reevaluation. If experience numbness/tingling of the inner thighs, loss of bladder or bowel control, go to the emergency department for evaluation.

## 2018-07-06 NOTE — ED Triage Notes (Signed)
Pt states since Thursday shes had L sided flank/back pain, states very painful with movement. NO issues  With urination.

## 2018-07-06 NOTE — ED Provider Notes (Signed)
[facility]    CSN: [id number] Arrival date & time: 07/06/18  1023     History   Chief Complaint Chief Complaint  Patient presents with  . Flank Pain    HPI Diana Garrison is a 48 y.o. female.   48 year old female come sin for 3 day history of left sided flank/back pain. Denies known injury/trauma. Pain is throbbing in sensation, seems to be constant, worse with laying on right side, better with laying on left side. Denies urinary frequency, dysuria. Unable to assess hematuria as patient currently on cycle. She denies abdominal pain, nausea, vomiting. Denies history of kidney stones. Has been taking ibuprofen  BID without any relief.      History reviewed. No pertinent past medical history.  Patient Active Problem List   Diagnosis Date Noted  . Spider veins of both lower extremities 10/17/2016  . Back pain 09/04/2016  . Hx of syphilis 04/06/2016  . Menorrhagia 06/06/2013  . Morbid obesity with BMI of 40.0-44.9, adult (HCC) 06/06/2013  . Smoker 06/06/2013    Past Surgical History:  Procedure Laterality Date  . CESAREAN SECTION    . GANGLION CYST EXCISION     x2    OB History    Gravida  3   Para  3   Term  2   Preterm  1   AB  0   Living  2     SAB  0   TAB  0   Ectopic  0   Multiple  0   Live Births  1            Home Medications    Prior to Admission medications   Medication Sig Start Date End Date Taking? Authorizing Provider  amoxicillin-clavulanate (AUGMENTIN) 875-125 MG tablet Take 1 tablet by mouth every 12 (twelve) hours. Patient not taking: Reported on 07/06/2018 03/24/18   Ofilia Neas, PA-C  benzonatate (TESSALON) 200 MG capsule Take 1 capsule (200 mg total) by mouth 3 (three) times daily as needed for cough. Patient not taking: Reported on 07/06/2018 03/24/18   Ofilia Neas, PA-C  buPROPion [facility] SR) 150 MG 12 hr tablet Take 1 tablet (150 mg total) by mouth 2 (two) times daily. Patient not taking:  Reported on 07/06/2018 10/13/17   Morrell Riddle, PA-C  ibuprofen (ADVIL,MOTRIN) 800 MG tablet Take 1 tablet (800 mg total) by mouth every 8 (eight) hours as needed. Patient not taking: Reported on 07/06/2018 10/10/17   Morrell Riddle, PA-C  meloxicam (MOBIC) 7.5 MG tablet Take 1 tablet (7.5 mg total) by mouth daily. 07/06/18   Cathie Hoops, Luvia Orzechowski V, PA-C  methocarbamol (ROBAXIN) 500 MG tablet Take 1 tablet (500 mg total) by mouth 2 (two) times daily. 07/06/18   Cathie Hoops, Eulalah Rupert V, PA-C  Multiple Vitamins-Minerals (CENTRUM ADULTS) TABS Take 1 tablet by mouth daily. 11/08/16   Weber, Dema Severin, PA-C  Omega-3 Fatty Acids (FISH OIL) 1000 MG CAPS Take 1 capsule (1,000 mg total) by mouth daily. 09/15/16   Valarie Cones, Dema Severin, PA-C  omeprazole (PRILOSEC) 20 MG capsule Take 1 capsule (20 mg total) by mouth daily. Patient not taking: Reported on 07/06/2018 01/30/18   Elvina Sidle, MD  ondansetron (ZOFRAN-ODT) 8 MG disintegrating tablet Take 1 tablet (8 mg total) by mouth every 8 (eight) hours as needed for nausea. Patient not taking: Reported on 07/06/2018 01/30/18   Elvina Sidle, MD  rosuvastatin (CRESTOR) 20 MG tablet Take 1 tablet (20 mg total) by mouth daily.  10/17/17   Valarie Cones, Dema Severin, PA-C  valACYclovir (VALTREX) 1000 MG tablet Take 2 pills at onset of cold sore and then repeat in 12 hours for each outbreak 10/13/17   Valarie Cones, Dema Severin, PA-C    Family History Family History  Problem Relation Age of Onset  . Cirrhosis Father   . Pulmonary embolism Sister     Social History Social History   Tobacco Use  . Smoking status: Current Every Day Smoker    Packs/day: 1.00    Start date: 10/10/2007  . Smokeless tobacco: Never Used  Substance Use Topics  . Alcohol use: Yes    Comment: 4 drinks a week - red wine  . Drug use: Yes    Types: Marijuana     Allergies   Patient has no known allergies.   Review of Systems Review of Systems  Reason unable to perform ROS: See HPI as above.     Physical Exam Triage Vital Signs ED  Triage Vitals [07/06/18 1033]  Enc Vitals Group     BP (!) 150/90     Pulse Rate (!) 105     Resp 16     Temp 98.3 F (36.8 C)     Temp src      SpO2 100 %     Weight      Height      Head Circumference      Peak Flow      Pain Score 10     Pain Loc      Pain Edu?      Excl. in GC?    No data found.  Updated Vital Signs BP (!) 150/90   Pulse (!) 105   Temp 98.3 F (36.8 C)   Resp 16   LMP 07/01/2018   SpO2 100%   Physical Exam Constitutional:      General: She is not in acute distress.    Appearance: She is well-developed. She is not ill-appearing, toxic-appearing or diaphoretic.     Comments: Tearful   HENT:     Head: Normocephalic and atraumatic.  Eyes:     Conjunctiva/sclera: Conjunctivae normal.     Pupils: Pupils are equal, round, and reactive to light.  Cardiovascular:     Rate and Rhythm: Normal rate and regular rhythm.     Heart sounds: Normal heart sounds. No murmur. No friction rub. No gallop.   Pulmonary:     Effort: Pulmonary effort is normal. No accessory muscle usage or respiratory distress.     Breath sounds: Normal breath sounds. No stridor. No decreased breath sounds, wheezing, rhonchi or rales.  Musculoskeletal:     Comments: No tenderness on palpation of the spinous processes. Tenderness to palpation of left mid flank area. Decreased ROM of back due to pain. Unable to assess hip ROM as patient unable to tolerate laying down.   30-minute post Toradol injection, patient with improvement of symptoms.  Able to lay down for hip range of motion.  Still unable to perform active range of motion due to back pain, however full passive range of motion.  Negative straight leg raise.  Strength deferred.  Sensation intact and equal bilaterally.  Skin:    General: Skin is warm and dry.  Neurological:     Mental Status: She is alert and oriented to person, place, and time.      UC Treatments / Results  Labs (all labs ordered are listed, but only abnormal  results are displayed) Labs Reviewed -  No data to display  EKG None  Radiology No results found.  Procedures Procedures (including critical care time)  Medications Ordered in UC Medications  ketorolac (TORADOL) 30 MG/ML injection 30 mg (30 mg Intramuscular Given 07/06/18 1055)    Initial Impression / Assessment and Plan / UC Course  I have reviewed the triage vital signs and the nursing notes.  Pertinent labs & imaging results that were available during my care of the patient were reviewed by me and considered in my medical decision making (see chart for details).    Patient with improvement of symptoms post Toradol injection.  Lower suspicion of kidney stone at this time given pain is associated with range of motion.  Unable to accurately assess hematuria as patient currently on cycle.  Will have patient start Mobic, Robaxin for symptoms.  Push fluids.  Continue to monitor.  Return precautions given.  Patient expresses understanding and agrees to plan.  Final Clinical Impressions(s) / UC Diagnoses   Final diagnoses:  Flank pain   ED Prescriptions    Medication Sig Dispense Auth. Provider   methocarbamol (ROBAXIN) 500 MG tablet Take 1 tablet (500 mg total) by mouth 2 (two) times daily. 20 tablet Ashlyn Cabler V, PA-C   meloxicam (MOBIC) 7.5 MG tablet Take 1 tablet (7.5 mg total) by mouth daily. 15 tablet Threasa AlphaYu, Yash Cacciola V, PA-C        Asaf Elmquist V, [place] JerseyPA-C 07/06/18 1125

## 2018-07-15 ENCOUNTER — Telehealth (INDEPENDENT_AMBULATORY_CARE_PROVIDER_SITE_OTHER): Payer: BLUE CROSS/BLUE SHIELD | Admitting: Registered Nurse

## 2018-07-15 ENCOUNTER — Encounter: Payer: Self-pay | Admitting: Registered Nurse

## 2018-07-15 ENCOUNTER — Other Ambulatory Visit: Payer: Self-pay

## 2018-07-15 DIAGNOSIS — Z7689 Persons encountering health services in other specified circumstances: Secondary | ICD-10-CM

## 2018-07-15 DIAGNOSIS — Z716 Tobacco abuse counseling: Secondary | ICD-10-CM

## 2018-07-15 DIAGNOSIS — M25512 Pain in left shoulder: Secondary | ICD-10-CM

## 2018-07-15 MED ORDER — VARENICLINE TARTRATE 0.5 MG PO TABS: 0.5000 mg | ORAL_TABLET | Freq: Two times a day (BID) | ORAL | 0 refills | Status: DC

## 2018-07-15 NOTE — Progress Notes (Signed)
Telemedicine Encounter- SOAP NOTE Established Patient  This telephone encounter was conducted with the patient's (or proxy's) verbal consent via audio telecommunications: yes   Patient was instructed to have this encounter in a suitably private space; and to only have persons present to whom they give permission to participate. In addition, patient identity was confirmed by use of name plus two identifiers (DOB and address).  I discussed the limitations, risks, security and privacy concerns of performing an evaluation and management service by telephone and the availability of in person appointments. I also discussed with the patient that there may be a patient responsible charge related to this service. The patient expressed understanding and agreed to proceed.  I spent a total of 15 minutes talking with the patient or their proxy.  CC: Establish care, shoulder pain, smoking cessation  Subjective   Diana Garrison is a 48 y.o. established patient. Telephone visit today to establish myself as PCP, discuss recent Urgent Care visits for joint pain (hip and shoulder) and to discuss smoking cessation.  Pt formerly on Benny Lennert, PA-C panel.   Pt has history significant for back pain, obesity, UAB, anxiety, and a significant smoking history.  Pt declines medication refills at this time.  Pt expresses intermittent shoulder pain, feels muscular in nature, shooting quality, no radiation, mobic gives limited relief, no precipitating event, no known positional trigger. Seen for similar pain in hip about 1 month prior to visit.  Pt states she has decreased smoking from 1ppd to about 0.25ppd. Pt states she wants help to quit entirely.    Patient Active Problem List   Diagnosis Date Noted  . Spider veins of both lower extremities 10/17/2016  . Back pain 09/04/2016  . Menorrhagia 06/06/2013  . Morbid obesity with BMI of 40.0-44.9, adult (HCC) 06/06/2013  . Smoker 06/06/2013    No past  medical history on file.  Current Outpatient Medications  Medication Sig Dispense Refill  . buPROPion (WELLBUTRIN SR) 150 MG 12 hr tablet Take 1 tablet (150 mg total) by mouth 2 (two) times daily. 60 tablet 1  . ibuprofen (ADVIL,MOTRIN) 800 MG tablet Take 1 tablet (800 mg total) by mouth every 8 (eight) hours as needed. 90 tablet 1  . meloxicam (MOBIC) 7.5 MG tablet Take 1 tablet (7.5 mg total) by mouth daily. 15 tablet 0  . methocarbamol (ROBAXIN) 500 MG tablet Take 1 tablet (500 mg total) by mouth 2 (two) times daily. 20 tablet 0  . Multiple Vitamins-Minerals (CENTRUM ADULTS) TABS Take 1 tablet by mouth daily. 90 tablet 4  . rosuvastatin (CRESTOR) 20 MG tablet Take 1 tablet (20 mg total) by mouth daily. 90 tablet 1  . valACYclovir (VALTREX) 1000 MG tablet Take 2 pills at onset of cold sore and then repeat in 12 hours for each outbreak 30 tablet 1  . varenicline (CHANTIX) 0.5 MG tablet Take 1 tablet (0.5 mg total) by mouth 2 (two) times daily. 60 tablet 0   No current facility-administered medications for this visit.     No Known Allergies  Social History   Socioeconomic History  . Marital status: Single    Spouse name: Not on file  . Number of children: 2  . Years of education: Not on file  . Highest education level: Not on file  Occupational History  . Occupation: CNA    Comment: [employer]  Social Needs  . Financial resource strain: Not hard at all  . Food insecurity:    Worry: Never true  Inability: Never true  . Transportation needs:    Medical: No    Non-medical: No  Tobacco Use  . Smoking status: Current Every Day Smoker    Packs/day: 0.25    Types: Cigarettes    Start date: 10/10/2007  . Smokeless tobacco: Never Used  Substance and Sexual Activity  . Alcohol use: Yes    Comment: 4 drinks a week - red wine  . Drug use: Yes    Types: Marijuana  . Sexual activity: Yes    Partners: Male    Birth control/protection: None, Condom    Comment: 10/04/14: Not  sexually active x5 mo due to spouse having had a CVA  Lifestyle  . Physical activity:    Days per week: 5 days    Minutes per session: 30 min  . Stress: Not at all  Relationships  . Social connections:    Talks on phone: Once a week    Gets together: Once a week    Attends religious service: More than 4 times per year    Active member of club or organization: No    Attends meetings of clubs or organizations: Never    Relationship status: Divorced  . Intimate partner violence:    Fear of current or ex partner: No    Emotionally abused: No    Physically abused: No    Forced sexual activity: No  Other Topics Concern  . Not on file  Social History Narrative   Lives with daughter       Seatbelt - 100%      Guns in home - no    Review of Systems  Constitutional: Negative for chills and fever.  Respiratory: Negative for cough, shortness of breath and wheezing.   Cardiovascular: Negative for chest pain and palpitations.  Gastrointestinal: Negative for constipation, diarrhea, nausea and vomiting.  Musculoskeletal: Positive for joint pain (L shoulder, as per HPI). Negative for back pain.  All other systems reviewed and are negative.   Objective   Vitals as reported by the patient: There were no vitals filed for this visit. Telehealth visit  Diagnoses and all orders for this visit:  Encounter for smoking cessation counseling -     varenicline (CHANTIX) 0.5 MG tablet; Take 1 tablet (0.5 mg total) by mouth 2 (two) times daily.  Encounter to establish care  Pain in joint of left shoulder -     Ambulatory referral to Physical Therapy  PLAN  Pt reports ongoing pain despite mobic and robaxin. At this point, referral to PT necessary to further assess pain.   Pt requests assistance in quitting smoking. Chantix given at this time. Pt is optimistic that smoking cessation can happen. Given two months supply, will reassess at that time  Plan to schedule a CPE post COVID-19.    Encouraged patient to contact clinic with any questions, comments, or concerns.   I discussed the assessment and treatment plan with the patient. The patient was provided an opportunity to ask questions and all were answered. The patient agreed with the plan and demonstrated an understanding of the instructions.   The patient was advised to call back or seek an in-person evaluation if the symptoms worsen or if the condition fails to improve as anticipated.  I provided 15 minutes of non-face-to-face time during this encounter.  Janeece Ageeichard Brittane Grudzinski, NP  [facility]

## 2018-07-15 NOTE — Progress Notes (Signed)
Spoke with pt this morning and she states she has been having back and shoulder pain x 1 month.  She states the pain is in the left shoulder and middle back. She states it feels more muscular.

## 2018-07-22 ENCOUNTER — Telehealth: Payer: Self-pay | Admitting: Registered Nurse

## 2018-07-22 ENCOUNTER — Other Ambulatory Visit: Payer: Self-pay | Admitting: Registered Nurse

## 2018-07-22 DIAGNOSIS — M25512 Pain in left shoulder: Secondary | ICD-10-CM

## 2018-07-22 MED ORDER — MELOXICAM 7.5 MG PO TABS: 7.5000 mg | ORAL_TABLET | Freq: Every day | ORAL | 0 refills | Status: DC

## 2018-07-22 MED ORDER — METHOCARBAMOL 500 MG PO TABS
500.0000 mg | ORAL_TABLET | Freq: Two times a day (BID) | ORAL | 0 refills | Status: DC
Start: 2018-07-22 — End: 2018-12-07

## 2018-07-22 NOTE — Telephone Encounter (Signed)
Rx will be sent to pharmacy today by provider.

## 2018-07-22 NOTE — Telephone Encounter (Signed)
Copied from CRM [id number]. Topic: Quick Communication - See Telephone Encounter >> Jul 22, 2018  9:03 AM Debroah Loop wrote: CRM for notification. See Telephone encounter for: 07/22/18. Patient calling to request pain medicine for her shoulder because she is out of pain medication and muscle relaxer and her therapy appointment is not until 07/30/2018. Please advise.

## 2018-07-30 ENCOUNTER — Other Ambulatory Visit: Payer: Self-pay

## 2018-07-30 ENCOUNTER — Ambulatory Visit: Payer: BC Managed Care – PPO | Attending: Registered Nurse | Admitting: Physical Therapy

## 2018-07-30 ENCOUNTER — Encounter: Payer: Self-pay | Admitting: Physical Therapy

## 2018-07-30 DIAGNOSIS — M62838 Other muscle spasm: Secondary | ICD-10-CM | POA: Diagnosis not present

## 2018-07-30 DIAGNOSIS — M25512 Pain in left shoulder: Secondary | ICD-10-CM | POA: Diagnosis not present

## 2018-07-30 DIAGNOSIS — G8929 Other chronic pain: Secondary | ICD-10-CM | POA: Diagnosis not present

## 2018-07-30 NOTE — Therapy (Addendum)
[place], [place], [address] Phone: [phone]   Fax:  [phone]  Physical Therapy Treatment/ discharge   Patient Details  Name: Diana Garrison MRN: [id number] Date of Birth: 1971/03/06 Referring Provider (PT): Maximiano Coss, NP   Encounter Date: 07/30/2018  PT End of Session - 07/30/18 1535    Visit Number  1    Number of Visits  13    Date for PT Re-Evaluation  09/10/18    Authorization Type  BCBS    PT Start Time  7253    PT Stop Time  1531    PT Time Calculation (min)  46 min    Behavior During Therapy  [facility] for tasks assessed/performed       History reviewed. No pertinent past medical history.  Past Surgical History:  Procedure Laterality Date  . CESAREAN SECTION    . GANGLION CYST EXCISION     x2    There were no vitals filed for this visit.  Subjective Assessment - 07/30/18 1452    Subjective  pt is 48 y.o F with CC of L shoulder pain which started early april which occurred when she caught a pt at work so the pt wouldn't fall and noticed pain starting later that evening and the next night causing her to go to urgent care.  pain stays mostly in the shoulder, she reports N/T in the 2nd-4th digits intermittently. she denies HA, and no hx or L shoulder issues.      How long can you sit comfortably?  unlimited    How long can you stand comfortably?  unlimited    How long can you walk comfortably?  unlimited    Diagnostic tests  N/A    Patient Stated Goals  to be in no pain, to get back to lifting and normal activity.    Currently in Pain?  Yes    Pain Score  5    at worst 10/10, last took medication at 9am   Pain Location  Shoulder    Pain Orientation  Left    Pain Descriptors / Indicators  Throbbing;Spasm;Aching    Pain Type  Chronic pain    Pain Radiating Towards  N/T in to the fingers of 2nd - 4th digits    Pain Onset  More than a month ago    Pain Frequency  Intermittent     Aggravating Factors   lifting and laying down     Pain Relieving Factors  prescribed medication, and tylenol         [facility] PT Assessment - 07/30/18 1446      Assessment   Medical Diagnosis  L shoulder pain    Referring Provider (PT)  Maximiano Coss, NP    Onset Date/Surgical Date  --   April 2020   Hand Dominance  Right    Next MD Visit  3 months    Prior Therapy  no      Precautions   Precautions  None      Restrictions   Weight Bearing Restrictions  No      Balance Screen   Has the patient fallen in the past 6 months  No    Has the patient had a decrease in activity level because of a fear of falling?   No    Is the patient reluctant to leave their home because of a fear of falling?   No      Home Environment  Living Environment  Private residence    Living Arrangements  Children    Available Help at Discharge  Family    Type of [place] Access  Level entry    [place]  None      Prior Function   Level of Independence  Independent    Vocation  Full time employment   CNA, med Location manager, pushing/pulling, carryhing     Leisure  shopping,       Cognition   Overall Cognitive Status  Within Functional Limits for tasks assessed      Observation/Other Assessments   Focus on Therapeutic Outcomes (FOTO)   36% limited   predicted 26%     Posture/Postural Control   Posture/Postural Control  Postural limitations    Postural Limitations  Rounded Shoulders;Forward head      ROM / Strength   AROM / PROM / Strength  AROM;Strength;PROM      AROM   AROM Assessment Site  Shoulder    Right/Left Shoulder  Right;Left    Right Shoulder Extension  70 Degrees    Right Shoulder Flexion  141 Degrees    Right Shoulder ABduction  155 Degrees    Right Shoulder Internal Rotation  --   T7   Right Shoulder External Rotation  --   C3   Left Shoulder Extension  34 Degrees   ERP   Left Shoulder Flexion   122 Degrees   ERP   Left Shoulder ABduction  105 Degrees   ERP   Left Shoulder Internal Rotation  --   T7   Left Shoulder External Rotation  --   T1     PROM   PROM Assessment Site  Shoulder    Right/Left Shoulder  Left      Strength   Strength Assessment Site  Shoulder;Hand    Right/Left Shoulder  Right;Left    Right Shoulder Flexion  4+/5    Right Shoulder Extension  5/5    Right Shoulder ABduction  4+/5    Right Shoulder Internal Rotation  5/5    Left Shoulder Flexion  4/5    Left Shoulder Extension  5/5    Left Shoulder ABduction  4/5    Left Shoulder Internal Rotation  5/5    Left Shoulder External Rotation  5/5    Right Hand Grip (lbs)  68.6   71,70,65   Left Hand Grip (lbs)  49.3   47,54,47     Palpation   Palpation comment  TTP upper trap / levator scapulae, with mulitple trigger points noted      Special Tests    Special Tests  Rotator Cuff Impingement    Rotator Cuff Impingment tests  Michel Bickers test;Full Can test;Empty Can test;other      Hawkins-Kennedy test   Findings  Positive    Side  Left      Empty Can test   Findings  Negative      Full Can test   Findings  Negative      other   Comments  scapular assist test                   [facility] Adult PT Treatment/Exercise - 07/30/18 1446      Self-Care   Self-Care  Posture    Posture  sleeping position in supine vs sidelying using pillow to prop the arm to  relieve tension      Exercises   Exercises  Shoulder      Shoulder Exercises: Standing   Protraction  Strengthening;Both;10 reps   wall push-up   Other Standing Exercises  lower trap wall y's with lift off1 x 10      Shoulder Exercises: Stretch   Other Shoulder Stretches  levator scapulae stretch 2 x 30 sec    Other Shoulder Stretches  upper trap stretch 2 x 30       Manual Therapy   Manual therapy comments  MTPR along the L upper trap x 2             PT Education - 07/30/18 1535    Education Details   evaluation findings, POC, goals, HEP with proper form/ rationale. Anatomy of area involved    Person(s) Educated  Patient    Methods  Explanation;Verbal cues;Handout    Comprehension  Verbalized understanding;Verbal cues required       PT Short Term Goals - 07/30/18 1542      PT SHORT TERM GOAL #1   Title  pt to be I with intial HEP    Time  3    Period  Weeks    Status  New    Target Date  08/20/18      PT SHORT TERM GOAL #2   Title  pt to verbalize/ demo proper posture and lifting mechanics to reduce and prevent L shoulder pain     Time  3    Period  Weeks    Status  New    Target Date  08/20/18      PT SHORT TERM GOAL #3   Title  increase L grip strength by >/= 10# to demo improving shoulder function     Time  3    Period  Weeks    Status  New    Target Date  08/20/18        PT Long Term Goals - 07/30/18 1543      PT LONG TERM GOAL #1   Title  increase L shoulder AROM to [facility] compared bil with no report of pain or fear of movement for ADLS     Time  6    Period  Weeks    Status  New    Target Date  09/10/18      PT LONG TERM GOAL #2   Title  increase L shoulder strength to >/= 4+/5 in all planes to promote motor control and maximize stability    Time  6    Period  Weeks    Status  New    Target Date  09/10/18      PT LONG TERM GOAL #3   Title  pt to be able to lift and lower >/= 10# to/ from overhead position and push/pull >/= 20# with no report of pain for work related activities     Time  6    Period  Weeks    Status  New    Target Date  09/10/18      PT LONG TERM GOAL #4   Title  increase FOTO score to </= 26% limited to demo improvement in function     Time  6    Period  Weeks    Status  New    Target Date  09/10/18      PT LONG TERM GOAL #5   Title  pt to be I with all HEP given as  of last visit to maintain and progress current level of function    Time  6    Period  Weeks    Status  New    Target Date  09/10/18            Plan -  07/30/18 1536    Clinical Impression Statement  pt presents to OPPT with CC or L shoulder pain following catching a lady from falling at work and felt pain gradually increase. She demonstrates mild limtation with L shoulder AROM secondary to pain/ guarding, exhibiting fear avoidance behaviors. She has functional strength with report of soreness, special testing revealed high probablity of impingement, combined with TTP along upper trap/ levator scapulae. She would benefit from physical therapy to decrease R shoulder pain, improve scapulohumeral rhythm, improve ROM and strength and return to PLOF by addressing the deficits listed.     Clinical Decision Making  Low    Rehab Potential  Good    PT Frequency  2x / week    PT Duration  6 weeks    PT Treatment/Interventions  ADLs/Self Care Home Management;Cryotherapy;Electrical Stimulation;Iontophoresis 69m/ml Dexamethasone;Moist Heat;Ultrasound;Therapeutic exercise;Dry needling;Taping;Passive range of motion;Patient/family education;Therapeutic activities;Neuromuscular re-education    PT Next Visit Plan  review/ update HEP, STW along upper trap/ levator scapulae, stretching, discuss potential DN, scapular stability/ rhythm.     PT Home Exercise Plan  upper trap stretch, levator scapulae stretch, wall push-up, lower trap wall y's    Consulted and Agree with Plan of Care  Patient       Patient will benefit from skilled therapeutic intervention in order to improve the following deficits and impairments:  Pain, Impaired UE functional use, Increased fascial restricitons, Decreased strength, Decreased range of motion, Decreased endurance, Decreased activity tolerance  Visit Diagnosis: Chronic left shoulder pain  Other muscle spasm     Problem List Patient Active Problem List   Diagnosis Date Noted  . Spider veins of both lower extremities 10/17/2016  . Back pain 09/04/2016  . Menorrhagia 06/06/2013  . Morbid obesity with BMI of 40.0-44.9, adult  ([place] 06/06/2013  . Smoker 06/06/2013   KStarr LakePT, DPT, LAT, ATC  07/30/18  3:49 PM      [facility] [address]Phone: [phone]  Fax:  [phone] Name: SGelisa TiekenMRN: [id number] of Birth: [birth year]    PHYSICAL THERAPY DISCHARGE SUMMARY  Visits from Start of Care: 1  Current functional level related to goals / functional outcomes: See goals   Remaining deficits: Unknown due to covid related closure   Education / Equipment: HEP  Plan: Patient agrees to discharge.  Patient goals were not met. Patient is being discharged due to not returning since the last visit.  ?????          Odis Turck PT, DPT, LAT, ATC  11/05/18  9:54 AM

## 2018-08-03 ENCOUNTER — Other Ambulatory Visit: Payer: Self-pay | Admitting: Registered Nurse

## 2018-08-03 DIAGNOSIS — M25512 Pain in left shoulder: Secondary | ICD-10-CM

## 2018-08-06 ENCOUNTER — Ambulatory Visit: Payer: BC Managed Care – PPO | Admitting: Physical Therapy

## 2018-08-06 ENCOUNTER — Telehealth: Payer: Self-pay | Admitting: Physical Therapy

## 2018-08-06 NOTE — Telephone Encounter (Signed)
Spoke to patient regarding no-show to appointment today. She reports she forgot. She does plan to attend her appointment tomorrow.

## 2018-08-07 ENCOUNTER — Encounter: Payer: Self-pay | Admitting: Physical Therapy

## 2018-08-07 ENCOUNTER — Ambulatory Visit: Payer: BC Managed Care – PPO | Admitting: Physical Therapy

## 2018-08-13 ENCOUNTER — Ambulatory Visit: Payer: BC Managed Care – PPO | Admitting: Physical Therapy

## 2018-08-15 ENCOUNTER — Encounter: Payer: BLUE CROSS/BLUE SHIELD | Admitting: Physical Therapy

## 2018-08-19 ENCOUNTER — Ambulatory Visit: Payer: BC Managed Care – PPO | Attending: Registered Nurse | Admitting: Physical Therapy

## 2018-08-19 ENCOUNTER — Telehealth: Payer: Self-pay | Admitting: Physical Therapy

## 2018-08-19 NOTE — Telephone Encounter (Signed)
Left message regarding second no-show and reminder of attendance policy. Left next appointment date/time and reminded her that she would be discharged if she has another no-show appointment.

## 2018-08-21 ENCOUNTER — Telehealth: Payer: Self-pay | Admitting: Physical Therapy

## 2018-08-21 ENCOUNTER — Ambulatory Visit: Payer: BC Managed Care – PPO | Admitting: Physical Therapy

## 2018-08-21 NOTE — Telephone Encounter (Signed)
Discussed that due to pt missing today's appointment and its her 2nd missed appointment that we are going to cancel her future appointments. It was noted that her father had just passed and that she has yet to return to town. I stated our condolences and when she returns if she would like to resume physical therapy to call and we can work to schedule her an appointment for reassessment.

## 2018-08-26 ENCOUNTER — Ambulatory Visit: Payer: BC Managed Care – PPO | Admitting: Physical Therapy

## 2018-08-28 ENCOUNTER — Ambulatory Visit: Payer: BC Managed Care – PPO | Admitting: Physical Therapy

## 2018-09-03 ENCOUNTER — Other Ambulatory Visit: Payer: Self-pay | Admitting: Registered Nurse

## 2018-09-03 ENCOUNTER — Encounter: Payer: Self-pay | Admitting: Registered Nurse

## 2018-09-03 DIAGNOSIS — F17200 Nicotine dependence, unspecified, uncomplicated: Secondary | ICD-10-CM

## 2018-09-03 MED ORDER — BUPROPION HCL ER (SR) 150 MG PO TB12: 150.0000 mg | ORAL_TABLET | Freq: Every day | ORAL | 0 refills | Status: DC

## 2018-09-03 NOTE — Progress Notes (Signed)
Pt had been taking Chantix, but was getting nightmares. Recently seen to establish care, discuss shoulder pain, discuss smoking cessation. Wishes to switch back to Wellbutrin. Given 45 day supply, I would like to see this patient in around 1 month for a medication check and to further discuss smoking cessation.  Kathrin Ruddy, NP

## 2018-09-23 ENCOUNTER — Other Ambulatory Visit: Payer: Self-pay | Admitting: Registered Nurse

## 2018-09-23 DIAGNOSIS — Z716 Tobacco abuse counseling: Secondary | ICD-10-CM

## 2018-09-25 ENCOUNTER — Other Ambulatory Visit: Payer: Self-pay | Admitting: Registered Nurse

## 2018-09-25 DIAGNOSIS — F17200 Nicotine dependence, unspecified, uncomplicated: Secondary | ICD-10-CM

## 2018-09-25 NOTE — Telephone Encounter (Signed)
Requested medication (s) are due for refill today: Yes  Requested medication (s) are on the active medication list: Yes  Last refill:  07/15/18  Future visit scheduled: No  Notes to clinic:  Diagnosis code needed    Requested Prescriptions  Pending Prescriptions Disp Refills   CHANTIX 0.5 MG tablet [Pharmacy Med Name: CHANTIX 0.5 MG TABLET] 56 tablet 0    Sig: TAKE 1 TABLET TWICE A DAY     Psychiatry:  Drug Dependence Therapy Passed - 09/23/2018  8:20 PM      Passed - Valid encounter within last 12 months    Recent Outpatient Visits          11 months ago Annual physical exam   [facility], Damaris Hippo, PA-C   1 year ago Abdominal pain, unspecified abdominal location   [facility] at [place], [place], [place]   1 year ago History of trichomoniasis   [facility], Damaris Hippo, PA-C   1 year ago Abscess of chest wall   Primary Care at [facility], Renette Butters, MD   1 year ago Abscess of chest wall   Primary Care at [facility], Renette Butters, MD

## 2018-09-26 NOTE — Telephone Encounter (Signed)
Requested medication (s) are due for refill today: No - early  Requested medication (s) are on the active medication list: Yes  Last refill:  09/03/18  Future visit scheduled: No  Notes to clinic:  Needs visit    Requested Prescriptions  Pending Prescriptions Disp Refills   buPROPion (WELLBUTRIN SR) 150 MG 12 hr tablet [Pharmacy Med Name: BUPROPION HCL SR 150 MG TABLET] 30 tablet 1    Sig: TAKE 1 TABLET BY MOUTH EVERY DAY     Psychiatry: Antidepressants - bupropion Failed - 09/25/2018  1:14 AM      Failed - Last BP in normal range    BP Readings from Last 1 Encounters:  07/06/18 (!) 150/90         Failed - Valid encounter within last 6 months    Recent Outpatient Visits          11 months ago Annual physical exam   [facility] at [place], PA-C   1 year ago Abdominal pain, unspecified abdominal location   [facility] at [place], [place], [place]   1 year ago History of trichomoniasis   [facility], Damaris Hippo, PA-C   1 year ago Abscess of chest wall   [facility] at [facility], Renette Butters, MD   1 year ago Abscess of chest wall   [facility] at [facility], Renette Butters, MD             Failed - Completed PHQ-2 or PHQ-9 in the last 360 days.

## 2018-09-27 ENCOUNTER — Other Ambulatory Visit: Payer: Self-pay | Admitting: Registered Nurse

## 2018-09-27 ENCOUNTER — Encounter: Payer: Self-pay | Admitting: Registered Nurse

## 2018-09-27 DIAGNOSIS — R52 Pain, unspecified: Secondary | ICD-10-CM

## 2018-09-27 MED ORDER — IBUPROFEN 800 MG PO TABS
800.0000 mg | ORAL_TABLET | Freq: Three times a day (TID) | ORAL | 1 refills | Status: DC | PRN
Start: 2018-09-27 — End: 2018-11-19

## 2018-09-27 NOTE — Progress Notes (Signed)
Refill on ibuprofen sent  Kathrin Ruddy NP

## 2018-10-19 ENCOUNTER — Other Ambulatory Visit: Payer: Self-pay | Admitting: Registered Nurse

## 2018-10-19 DIAGNOSIS — F17200 Nicotine dependence, unspecified, uncomplicated: Secondary | ICD-10-CM

## 2018-10-29 ENCOUNTER — Other Ambulatory Visit: Payer: Self-pay | Admitting: Registered Nurse

## 2018-10-29 ENCOUNTER — Other Ambulatory Visit: Payer: Self-pay | Admitting: Family Medicine

## 2018-10-29 DIAGNOSIS — Z1231 Encounter for screening mammogram for malignant neoplasm of breast: Secondary | ICD-10-CM

## 2018-10-31 ENCOUNTER — Telehealth: Payer: Self-pay

## 2018-10-31 NOTE — Telephone Encounter (Signed)
Fax from [facility] to request refill for Crestor.  Has not had labs recently.  Upcoming appt 08/26, can have labs and discuss refill.

## 2018-11-06 ENCOUNTER — Other Ambulatory Visit (HOSPITAL_COMMUNITY)
Admission: RE | Admit: 2018-11-06 | Discharge: 2018-11-06 | Disposition: A | Discharge status: Home / Self Care | Payer: BC Managed Care – PPO | Source: Ambulatory Visit | Attending: Registered Nurse | Admitting: Registered Nurse

## 2018-11-06 ENCOUNTER — Other Ambulatory Visit: Payer: Self-pay

## 2018-11-06 ENCOUNTER — Ambulatory Visit (INDEPENDENT_AMBULATORY_CARE_PROVIDER_SITE_OTHER): Payer: BC Managed Care – PPO | Admitting: Registered Nurse

## 2018-11-06 ENCOUNTER — Encounter: Payer: Self-pay | Admitting: Registered Nurse

## 2018-11-06 VITALS — BP 115/78 | HR 82 | Temp 98.0°F | Resp 16 | Ht 67.32 in | Wt 323.0 lb

## 2018-11-06 DIAGNOSIS — Z23 Encounter for immunization: Secondary | ICD-10-CM | POA: Diagnosis not present

## 2018-11-06 DIAGNOSIS — Z13220 Encounter for screening for lipoid disorders: Secondary | ICD-10-CM | POA: Diagnosis not present

## 2018-11-06 DIAGNOSIS — Z13228 Encounter for screening for other metabolic disorders: Secondary | ICD-10-CM | POA: Diagnosis not present

## 2018-11-06 DIAGNOSIS — Z113 Encounter for screening for infections with a predominantly sexual mode of transmission: Secondary | ICD-10-CM | POA: Insufficient documentation

## 2018-11-06 DIAGNOSIS — Z130 Encounter for screening for diseases of the blood and blood-forming organs and certain disorders involving the immune mechanism: Secondary | ICD-10-CM

## 2018-11-06 DIAGNOSIS — Z1329 Encounter for screening for other suspected endocrine disorder: Secondary | ICD-10-CM

## 2018-11-06 DIAGNOSIS — E7800 Pure hypercholesterolemia, unspecified: Secondary | ICD-10-CM

## 2018-11-06 DIAGNOSIS — Z0001 Encounter for general adult medical examination with abnormal findings: Secondary | ICD-10-CM

## 2018-11-06 DIAGNOSIS — Z0000 Encounter for general adult medical examination without abnormal findings: Secondary | ICD-10-CM

## 2018-11-06 MED ORDER — ROSUVASTATIN CALCIUM 20 MG PO TABS: 20.0000 mg | ORAL_TABLET | Freq: Every day | ORAL | 1 refills | Status: DC

## 2018-11-06 NOTE — Patient Instructions (Signed)
° ° ° °  If you have lab work done today you will be contacted with your lab results within the next 2 weeks.  If you have not heard from us then please contact us. The fastest way to get your results is to register for My Chart. ° ° °IF you received an x-ray today, you will receive an invoice from [facility]. Please contact  Radiology at [phone] with questions or concerns regarding your invoice.  ° °IF you received labwork today, you will receive an invoice from [facility]. Please contact [facility] at [phone] with questions or concerns regarding your invoice.  ° °Our billing staff will not be able to assist you with questions regarding bills from these companies. ° °You will be contacted with the lab results as soon as they are available. The fastest way to get your results is to activate your My Chart account. Instructions are located on the last page of this paperwork. If you have not heard from us regarding the results in 2 weeks, please contact this office. °  ° ° ° °

## 2018-11-06 NOTE — Progress Notes (Signed)
Established Patient Office Visit  Subjective:  Patient ID: Diana Garrison, female    DOB: [birth year]  Age: 48 y.o. MRN: [id number]  CC:  Chief Complaint  Patient presents with  . Annual Exam    HPI Diana Garrison presents for CPE  Interested in losing weight - wants to start liraglutide. Is able to endorse good diet and exercise habits, demonstrating understanding of each. Is interested in liraglutide as adjunctive therapy. We will draw labs, upon getting results, will consider.  Otherwise, feels good. Some occasional shoulder pain, but decided PT was not worth the copay - doing exercises and stretching at home.  No other complaints  History reviewed. No pertinent past medical history.  Past Surgical History:  Procedure Laterality Date  . CESAREAN SECTION    . GANGLION CYST EXCISION     x2    Family History  Problem Relation Age of Onset  . Cirrhosis Father   . Pulmonary embolism Sister     Social History   Socioeconomic History  . Marital status: Single    Spouse name: Not on file  . Number of children: 2  . Years of education: Not on file  . Highest education level: Not on file  Occupational History  . Occupation: CNA    Comment: [employer]  Social Needs  . Financial resource strain: Not hard at all  . Food insecurity    Worry: Never true    Inability: Never true  . Transportation needs    Medical: No    Non-medical: No  Tobacco Use  . Smoking status: Current Every Day Smoker    Packs/day: 0.25    Types: Cigarettes    Start date: 10/10/2007  . Smokeless tobacco: Never Used  Substance and Sexual Activity  . Alcohol use: Yes    Comment: 4 drinks a week - red wine  . Drug use: Yes    Types: Marijuana  . Sexual activity: Yes    Partners: Male    Birth control/protection: None, Condom    Comment: 10/04/14: Not sexually active x5 mo due to spouse having had a CVA  Lifestyle  . Physical activity    Days per week: 5 days    Minutes per session: 30  min  . Stress: Not at all  Relationships  . Social Herbalist on phone: Once a week    Gets together: Once a week    Attends religious service: More than 4 times per year    Active member of club or organization: No    Attends meetings of clubs or organizations: Never    Relationship status: Divorced  . Intimate partner violence    Fear of current or ex partner: No    Emotionally abused: No    Physically abused: No    Forced sexual activity: No  Other Topics Concern  . Not on file  Social History Narrative   Lives with daughter       Seatbelt - 100%      Guns in home - no    Outpatient Medications Prior to Visit  Medication Sig Dispense Refill  . Cholecalciferol (VITAMIN D3) 1.25 MG (50000 UT) CAPS Take by mouth.    . cyanocobalamin 100 MCG tablet Take 100 mcg by mouth daily.    Marland Kitchen ibuprofen (ADVIL) 800 MG tablet Take 1 tablet (800 mg total) by mouth every 8 (eight) hours as needed. 90 tablet 1  . methocarbamol (ROBAXIN) 500 MG tablet Take 1 tablet (  500 mg total) by mouth 2 (two) times daily. 20 tablet 0  . Multiple Vitamins-Minerals (CENTRUM ADULTS) TABS Take 1 tablet by mouth daily. 90 tablet 4  . valACYclovir (VALTREX) 1000 MG tablet Take 2 pills at onset of cold sore and then repeat in 12 hours for each outbreak 30 tablet 1  . buPROPion (ZYBAN) 150 MG 12 hr tablet Take 150 mg by mouth daily.    . rosuvastatin (CRESTOR) 20 MG tablet Take 1 tablet (20 mg total) by mouth daily. 90 tablet 1  . buPROPion (WELLBUTRIN SR) 150 MG 12 hr tablet TAKE 1 TABLET BY MOUTH EVERY DAY 30 tablet 0  . CHANTIX 0.5 MG tablet TAKE 1 TABLET TWICE A DAY 56 tablet 0  . meloxicam (MOBIC) 7.5 MG tablet Take 1 tablet (7.5 mg total) by mouth daily. 15 tablet 0   No facility-administered medications prior to visit.     No Known Allergies  ROS Review of Systems  Constitutional: Negative.   HENT: Negative.   Eyes: Negative.   Respiratory: Negative.   Cardiovascular: Negative.    Gastrointestinal: Negative.   Endocrine: Negative.   Genitourinary: Negative.   Musculoskeletal: Positive for arthralgias (L shoulder intermittent).  Skin: Negative.   Allergic/Immunologic: Negative.   Neurological: Negative.   Hematological: Negative.   Psychiatric/Behavioral: Negative.   All other systems reviewed and are negative.     Objective:    Physical Exam  Constitutional: She appears well-developed and well-nourished. No distress.  HENT:  Head: Normocephalic and atraumatic.  Right Ear: External ear normal.  Left Ear: External ear normal.  Nose: Nose normal.  Mouth/Throat: Oropharynx is clear and moist. No oropharyngeal exudate.  Eyes: Pupils are equal, round, and reactive to light. Conjunctivae and EOM are normal. Right eye exhibits no discharge. Left eye exhibits no discharge. No scleral icterus.  Neck: Normal range of motion. Neck supple. No tracheal deviation present. No thyromegaly present.  Cardiovascular: Normal rate, regular rhythm, normal heart sounds and intact distal pulses. Exam reveals no gallop and no friction rub.  No murmur heard. Pulmonary/Chest: Effort normal and breath sounds normal. No respiratory distress. She has no wheezes. She has no rales. She exhibits no tenderness.  Abdominal: Soft. Bowel sounds are normal. She exhibits no distension and no mass. There is no abdominal tenderness. There is no rebound and no guarding.  Musculoskeletal: Normal range of motion.        General: No tenderness, deformity or edema.  Lymphadenopathy:    She has no cervical adenopathy.  Neurological: She is alert. No cranial nerve deficit. She exhibits normal muscle tone. Coordination normal.  Skin: Skin is warm and dry. No rash noted. She is not diaphoretic. No erythema. No pallor.  Psychiatric: She has a normal mood and affect. Her behavior is normal. Judgment and thought content normal.  Nursing note and vitals reviewed.   BP 115/78   Pulse 82   Temp 98 F (36.7  C) (Oral)   Resp 16   Ht 5' 7.32" (1.71 m)   Wt (!) 323 lb (146.5 kg)   LMP 10/02/2018   SpO2 96%   BMI 50.10 kg/m  Wt Readings from Last 3 Encounters:  11/06/18 (!) 323 lb (146.5 kg)  06/23/18 280 lb (127 kg)  10/09/17 299 lb (135.6 kg)     Health Maintenance Due  Topic Date Due  . INFLUENZA VACCINE  10/12/2018    There are no preventive care reminders to display for this patient.  Lab Results  Component  Value Date   TSH 1.020 10/09/2017   Lab Results  Component Value Date   WBC 9.5 10/09/2017   HGB 13.5 10/09/2017   HCT 42.0 10/09/2017   MCV 95 10/09/2017   PLT 354 10/09/2017   Lab Results  Component Value Date   NA 140 10/09/2017   K 4.6 10/09/2017   CO2 22 10/09/2017   GLUCOSE 84 10/09/2017   BUN 7 10/09/2017   CREATININE 0.81 10/09/2017   BILITOT 0.3 10/09/2017   ALKPHOS 94 10/09/2017   AST 14 10/09/2017   ALT 11 10/09/2017   PROT 6.9 10/09/2017   ALBUMIN 4.4 10/09/2017   CALCIUM 9.6 10/09/2017   Lab Results  Component Value Date   CHOL 283 (H) 10/09/2017   Lab Results  Component Value Date   HDL 44 10/09/2017   Lab Results  Component Value Date   LDLCALC 197 (H) 10/09/2017   Lab Results  Component Value Date   TRIG 208 (H) 10/09/2017   Lab Results  Component Value Date   CHOLHDL 6.4 (H) 10/09/2017   Lab Results  Component Value Date   HGBA1C 5.6 10/09/2017      Assessment & Plan:   Problem List Items Addressed This Visit    None    Visit Diagnoses    Screen for STD (sexually transmitted disease)    -  Primary   Relevant Orders   RPR   HIV Antibody (routine testing w rflx)   Urine cytology ancillary only   Screening for endocrine, metabolic and immunity disorder       Relevant Orders   CBC with Differential/Platelet   Comprehensive metabolic panel   Hemoglobin A1c   TSH   Lipid screening       Relevant Orders   Lipid panel   Need for prophylactic vaccination and inoculation against influenza       Relevant Orders    Flu Vaccine QUAD 36+ mos IM (Completed)   Routine general medical examination at a health care facility       Elevated LDL cholesterol level       Relevant Medications   rosuvastatin (CRESTOR) 20 MG tablet      Meds ordered this encounter  Medications  . rosuvastatin (CRESTOR) 20 MG tablet    Sig: Take 1 tablet (20 mg total) by mouth daily.    Dispense:  90 tablet    Refill:  1    Order Specific Question:   Supervising Provider    Answer:   Doristine BosworthSTALLINGS, ZOE A [id number]]    Follow-up: Return in 1 year (on 11/06/2019).   PLAN  Refill rosuvastatin  Labs drawn, will follow up as warranted  Flu shot given  Normal findings on exam, no abnormalities through LUE  Reports abnormal menses - likely perimenopausal, offered Gyn referral for further resource, declines at this time, but may be interested in the future  Patient encouraged to call clinic with any questions, comments, or concerns.   Diana Ageeichard Jesica Goheen, NP

## 2018-11-07 LAB — LIPID PANEL
Chol/HDL Ratio: 4.1 ratio (ref 0.0–4.4)
Cholesterol, Total: 158 mg/dL (ref 100–199)
HDL: 39 mg/dL — ABNORMAL LOW (ref >39)
LDL Calculated: 81 mg/dL (ref 0–99)
Triglycerides: 189 mg/dL — ABNORMAL HIGH (ref 0–149)
VLDL Cholesterol Cal: 38 mg/dL (ref 5–40)

## 2018-11-07 LAB — CBC WITH DIFFERENTIAL/PLATELET
Basophils Absolute: 0 10*3/uL (ref 0.0–0.2)
Basos: 0 %
EOS (ABSOLUTE): 0.2 10*3/uL (ref 0.0–0.4)
Eos: 2 %
Hematocrit: 42 % (ref 34.0–46.6)
Hemoglobin: 13.6 g/dL (ref 11.1–15.9)
Immature Grans (Abs): 0 10*3/uL (ref 0.0–0.1)
Immature Granulocytes: 0 %
Lymphocytes Absolute: 3 10*3/uL (ref 0.7–3.1)
Lymphs: 28 %
MCH: 30.9 pg (ref 26.6–33.0)
MCHC: 32.4 g/dL (ref 31.5–35.7)
MCV: 96 fL (ref 79–97)
Monocytes Absolute: 0.7 10*3/uL (ref 0.1–0.9)
Monocytes: 6 %
Neutrophils Absolute: 6.8 10*3/uL (ref 1.4–7.0)
Neutrophils: 64 %
Platelets: 276 10*3/uL (ref 150–450)
RBC: 4.4 x10E6/uL (ref 3.77–5.28)
RDW: 13.3 % (ref 11.7–15.4)
WBC: 10.8 10*3/uL (ref 3.4–10.8)

## 2018-11-07 LAB — HEMOGLOBIN A1C
Est. average glucose Bld gHb Est-mCnc: 120 mg/dL
Hgb A1c MFr Bld: 5.8 % — ABNORMAL HIGH (ref 4.8–5.6)

## 2018-11-07 LAB — COMPREHENSIVE METABOLIC PANEL
ALT: 26 IU/L (ref 0–32)
AST: 22 IU/L (ref 0–40)
Albumin/Globulin Ratio: 1.8 (ref 1.2–2.2)
Albumin: 4.3 g/dL (ref 3.8–4.8)
Alkaline Phosphatase: 89 IU/L (ref 39–117)
BUN/Creatinine Ratio: 14 (ref 9–23)
BUN: 12 mg/dL (ref 6–24)
Bilirubin Total: 0.2 mg/dL (ref 0.0–1.2)
CO2: 22 mmol/L (ref 20–29)
Calcium: 9.4 mg/dL (ref 8.7–10.2)
Chloride: 100 mmol/L (ref 96–106)
Creatinine, Ser: 0.87 mg/dL (ref 0.57–1.00)
GFR calc Af Amer: 92 mL/min/{1.73_m2} (ref >59)
GFR calc non Af Amer: 80 mL/min/{1.73_m2} (ref >59)
Globulin, Total: 2.4 g/dL (ref 1.5–4.5)
Glucose: 91 mg/dL (ref 65–99)
Potassium: 4.4 mmol/L (ref 3.5–5.2)
Sodium: 136 mmol/L (ref 134–144)
Total Protein: 6.7 g/dL (ref 6.0–8.5)

## 2018-11-07 LAB — HIV ANTIBODY (ROUTINE TESTING W REFLEX): HIV Screen 4th Generation wRfx: NONREACTIVE

## 2018-11-07 LAB — RPR: RPR Ser Ql: NONREACTIVE

## 2018-11-07 LAB — TSH: TSH: 1.24 u[IU]/mL (ref 0.450–4.500)

## 2018-11-08 ENCOUNTER — Other Ambulatory Visit: Payer: Self-pay | Admitting: Registered Nurse

## 2018-11-08 ENCOUNTER — Encounter: Payer: Self-pay | Admitting: Registered Nurse

## 2018-11-08 DIAGNOSIS — Z6841 Body Mass Index (BMI) 40.0 and over, adult: Secondary | ICD-10-CM

## 2018-11-08 DIAGNOSIS — A599 Trichomoniasis, unspecified: Secondary | ICD-10-CM

## 2018-11-08 LAB — URINE CYTOLOGY ANCILLARY ONLY
Chlamydia: NEGATIVE
Neisseria Gonorrhea: NEGATIVE
Trichomonas: POSITIVE — AB

## 2018-11-08 MED ORDER — METRONIDAZOLE 500 MG PO TABS: 500.0000 mg | ORAL_TABLET | Freq: Two times a day (BID) | ORAL | 0 refills | Status: DC

## 2018-11-08 NOTE — Progress Notes (Signed)
Result letter sent to patient Still awaiting GC/CT Otherwise labs are looking fine Weight management ref sent  Kathrin Ruddy, NP

## 2018-11-08 NOTE — Progress Notes (Signed)
Pt tested positive for trichomonas.  Sending over metro Left vm with pt explaining treatment details and partner treatment.   Kathrin Ruddy, NP

## 2018-11-08 NOTE — Progress Notes (Signed)
I have already called Ms. Self and left a voicemail. But in case she calls back:  Her labs are great, so I have referred her to weight management for discussion of the [place] for weight loss. She did test positive for trichomonas. I will be sending over metronidazole 500mg  for her to take orally twice daily for 7 days. Thank you, Kathrin Ruddy, NP

## 2018-11-17 ENCOUNTER — Other Ambulatory Visit: Payer: Self-pay | Admitting: Registered Nurse

## 2018-11-17 DIAGNOSIS — R52 Pain, unspecified: Secondary | ICD-10-CM

## 2018-11-25 ENCOUNTER — Encounter: Payer: Self-pay | Admitting: Registered Nurse

## 2018-11-26 ENCOUNTER — Other Ambulatory Visit: Payer: Self-pay | Admitting: Registered Nurse

## 2018-11-26 DIAGNOSIS — E559 Vitamin D deficiency, unspecified: Secondary | ICD-10-CM

## 2018-11-26 DIAGNOSIS — R5383 Other fatigue: Secondary | ICD-10-CM

## 2018-11-27 ENCOUNTER — Ambulatory Visit: Payer: PRIVATE HEALTH INSURANCE

## 2018-11-27 ENCOUNTER — Other Ambulatory Visit: Payer: Self-pay

## 2018-11-27 DIAGNOSIS — E559 Vitamin D deficiency, unspecified: Secondary | ICD-10-CM

## 2018-11-27 DIAGNOSIS — R5383 Other fatigue: Secondary | ICD-10-CM

## 2018-12-03 ENCOUNTER — Encounter: Payer: Self-pay | Admitting: Registered Nurse

## 2018-12-03 LAB — VITAMIN D 25 HYDROXY (VIT D DEFICIENCY, FRACTURES): Vit D, 25-Hydroxy: 21.8 ng/mL — ABNORMAL LOW (ref 30.0–100.0)

## 2018-12-03 LAB — VITAMIN B12: Vitamin B-12: 1824 pg/mL — ABNORMAL HIGH (ref 232–1245)

## 2018-12-03 LAB — FOLATE: Folate: 15.9 ng/mL (ref >3.0)

## 2018-12-07 ENCOUNTER — Other Ambulatory Visit: Payer: Self-pay | Admitting: Registered Nurse

## 2018-12-07 DIAGNOSIS — M25512 Pain in left shoulder: Secondary | ICD-10-CM

## 2018-12-10 MED ORDER — METHOCARBAMOL 500 MG PO TABS: 500.0000 mg | ORAL_TABLET | Freq: Two times a day (BID) | ORAL | 0 refills | Status: DC

## 2018-12-11 ENCOUNTER — Ambulatory Visit
Admission: RE | Admit: 2018-12-11 | Discharge: 2018-12-11 | Disposition: A | Discharge status: Home / Self Care | Payer: BC Managed Care – PPO | Source: Ambulatory Visit | Attending: Registered Nurse | Admitting: Registered Nurse

## 2018-12-11 ENCOUNTER — Encounter: Payer: Self-pay | Admitting: Registered Nurse

## 2018-12-11 ENCOUNTER — Other Ambulatory Visit: Payer: Self-pay

## 2018-12-11 DIAGNOSIS — Z1231 Encounter for screening mammogram for malignant neoplasm of breast: Secondary | ICD-10-CM

## 2018-12-11 NOTE — Progress Notes (Signed)
Mammo results sent in letter via [place].  Kathrin Ruddy, NP

## 2019-01-21 ENCOUNTER — Telehealth: Payer: Self-pay | Admitting: Registered Nurse

## 2019-01-21 NOTE — Telephone Encounter (Signed)
Pt called and is waiting to hear from her weight management referral that was placed in August.

## 2019-01-28 ENCOUNTER — Other Ambulatory Visit: Payer: Self-pay

## 2019-01-28 ENCOUNTER — Telehealth (INDEPENDENT_AMBULATORY_CARE_PROVIDER_SITE_OTHER): Payer: BC Managed Care – PPO | Admitting: Registered Nurse

## 2019-01-28 ENCOUNTER — Encounter: Payer: Self-pay | Admitting: Registered Nurse

## 2019-01-28 DIAGNOSIS — M7660 Achilles tendinitis, unspecified leg: Secondary | ICD-10-CM | POA: Diagnosis not present

## 2019-01-28 MED ORDER — MELOXICAM 15 MG PO TABS: 15.0000 mg | ORAL_TABLET | Freq: Every day | ORAL | 0 refills | Status: DC

## 2019-01-28 NOTE — Progress Notes (Signed)
Pt states she noticed her feet was starting to swell with pain Saturday. She states she was recently seen for this problem and would like a Rx for the pain and swelling.  She states she has Achilles Tendon problem and she has been wearing a boot to help but it has not been helping.

## 2019-01-28 NOTE — Progress Notes (Signed)
Telemedicine Encounter- SOAP NOTE Established Patient  This telephone encounter was conducted with the patient's (or proxy's) verbal consent via audio telecommunications: yes  Patient was instructed to have this encounter in a suitably private space; and to only have persons present to whom they give permission to participate. In addition, patient identity was confirmed by use of name plus two identifiers (DOB and address).  I discussed the limitations, risks, security and privacy concerns of performing an evaluation and management service by telephone and the availability of in person appointments. I also discussed with the patient that there may be a patient responsible charge related to this service. The patient expressed understanding and agreed to proceed.  I spent a total of 11 minutes talking with the patient or their proxy.  No chief complaint on file.   Subjective   Diana Garrison is a 48 y.o. established patient. Telephone visit today for heel pain  HPI Slow onset over previous weeks. Located in posterior aspect of foot and extending upward along leg about 5-6 inches. It has been constant, but waxing and waning in its intensity. It is aggravated with more time on her feet, relieved by rest. Has tried ibuprofen without relief.  Has a hx of achilles tendonitis. Has a boot and has been wearing it, but boot is not relieving enough pain. Ibuprofen 800mg  "not touching it".  No redness, swelling, numbness, tingling, weakness. No acute injury.   Patient Active Problem List   Diagnosis Date Noted  . Spider veins of both lower extremities 10/17/2016  . Back pain 09/04/2016  . Menorrhagia 06/06/2013  . Morbid obesity with BMI of 40.0-44.9, adult (HCC) 06/06/2013  . Smoker 06/06/2013    No past medical history on file.  Current Outpatient Medications  Medication Sig Dispense Refill  . cyanocobalamin 100 MCG tablet Take 100 mcg by mouth daily.    06/08/2013 ibuprofen (ADVIL) 800 MG  tablet TAKE 1 TABLET BY MOUTH EVERY 8 HOURS AS NEEDED 90 tablet 1  . methocarbamol (ROBAXIN) 500 MG tablet Take 1 tablet (500 mg total) by mouth 2 (two) times daily. 20 tablet 0  . Multiple Vitamins-Minerals (CENTRUM ADULTS) TABS Take 1 tablet by mouth daily. 90 tablet 4  . rosuvastatin (CRESTOR) 20 MG tablet Take 1 tablet (20 mg total) by mouth daily. 90 tablet 1  . valACYclovir (VALTREX) 1000 MG tablet Take 2 pills at onset of cold sore and then repeat in 12 hours for each outbreak 30 tablet 1  . meloxicam (MOBIC) 15 MG tablet Take 1 tablet (15 mg total) by mouth daily. 30 tablet 0   No current facility-administered medications for this visit.     No Known Allergies  Social History   Socioeconomic History  . Marital status: Single    Spouse name: Not on file  . Number of children: 2  . Years of education: Not on file  . Highest education level: Not on file  Occupational History  . Occupation: CNA    Comment: [employer]  Social Needs  . Financial resource strain: Not hard at all  . Food insecurity    Worry: Never true    Inability: Never true  . Transportation needs    Medical: No    Non-medical: No  Tobacco Use  . Smoking status: Current Every Day Smoker    Packs/day: 0.25    Types: Cigarettes    Start date: 10/10/2007  . Smokeless tobacco: Never Used  Substance and Sexual Activity  . Alcohol use:  Yes    Comment: 4 drinks a week - red wine  . Drug use: Yes    Types: Marijuana  . Sexual activity: Yes    Partners: Male    Birth control/protection: None, Condom    Comment: 10/04/14: Not sexually active x5 mo due to spouse having had a CVA  Lifestyle  . Physical activity    Days per week: 5 days    Minutes per session: 30 min  . Stress: Not at all  Relationships  . Social Herbalist on phone: Once a week    Gets together: Once a week    Attends religious service: More than 4 times per year    Active member of club or organization: No    Attends  meetings of clubs or organizations: Never    Relationship status: Divorced  . Intimate partner violence    Fear of current or ex partner: No    Emotionally abused: No    Physically abused: No    Forced sexual activity: No  Other Topics Concern  . Not on file  Social History Narrative   Lives with daughter       Seatbelt - 100%      Guns in home - no    ROS Per hpi   Objective   Vitals as reported by the patient: There were no vitals filed for this visit.  Diagnoses and all orders for this visit:  Achilles tendinitis, unspecified laterality -     meloxicam (MOBIC) 15 MG tablet; Take 1 tablet (15 mg total) by mouth daily.   PLAN  Take meloxicam 15mg  PO qd for antiinflammatory effect.   Continue stretching, wearing boot, RICE as able  Return in 1 mo or sooner with any concerns regarding plan  Patient encouraged to call clinic with any questions, comments, or concerns.    I discussed the assessment and treatment plan with the patient. The patient was provided an opportunity to ask questions and all were answered. The patient agreed with the plan and demonstrated an understanding of the instructions.   The patient was advised to call back or seek an in-person evaluation if the symptoms worsen or if the condition fails to improve as anticipated.  I provided 11 minutes of non-face-to-face time during this encounter.  Maximiano Coss, NP  Primary Care at [facility]

## 2019-02-05 ENCOUNTER — Other Ambulatory Visit: Payer: Self-pay | Admitting: Registered Nurse

## 2019-02-05 DIAGNOSIS — M25512 Pain in left shoulder: Secondary | ICD-10-CM

## 2019-02-10 ENCOUNTER — Other Ambulatory Visit: Payer: Self-pay | Admitting: Registered Nurse

## 2019-02-10 DIAGNOSIS — M7660 Achilles tendinitis, unspecified leg: Secondary | ICD-10-CM

## 2019-02-12 ENCOUNTER — Other Ambulatory Visit: Payer: Self-pay | Admitting: Registered Nurse

## 2019-02-12 ENCOUNTER — Encounter: Payer: Self-pay | Admitting: Registered Nurse

## 2019-02-12 DIAGNOSIS — M7660 Achilles tendinitis, unspecified leg: Secondary | ICD-10-CM

## 2019-02-12 NOTE — Telephone Encounter (Signed)
PT IS CALLING BACK REGARDING THIS MOBIC REQUEST . FR

## 2019-02-14 ENCOUNTER — Other Ambulatory Visit: Payer: Self-pay | Admitting: Registered Nurse

## 2019-02-14 MED ORDER — METHOCARBAMOL 500 MG PO TABS: 500.0000 mg | ORAL_TABLET | Freq: Two times a day (BID) | ORAL | 0 refills | Status: DC

## 2019-02-14 MED ORDER — MELOXICAM 15 MG PO TABS: 15.0000 mg | ORAL_TABLET | Freq: Every day | ORAL | 0 refills | Status: DC

## 2019-02-14 NOTE — Telephone Encounter (Signed)
Sent to [facility] already

## 2019-02-14 NOTE — Telephone Encounter (Signed)
Please advise Patient would like a refill on mobic

## 2019-02-14 NOTE — Telephone Encounter (Signed)
Patient would like a refill

## 2019-02-15 ENCOUNTER — Other Ambulatory Visit: Payer: Self-pay | Admitting: Registered Nurse

## 2019-02-15 DIAGNOSIS — M7660 Achilles tendinitis, unspecified leg: Secondary | ICD-10-CM

## 2019-02-17 MED ORDER — MELOXICAM 15 MG PO TABS: 15.0000 mg | ORAL_TABLET | Freq: Every day | ORAL | 0 refills | Status: DC

## 2019-02-19 DIAGNOSIS — Z20828 Contact with and (suspected) exposure to other viral communicable diseases: Secondary | ICD-10-CM | POA: Diagnosis not present

## 2019-02-19 DIAGNOSIS — Z1159 Encounter for screening for other viral diseases: Secondary | ICD-10-CM | POA: Diagnosis not present

## 2019-03-06 ENCOUNTER — Other Ambulatory Visit: Payer: Self-pay | Admitting: Registered Nurse

## 2019-03-06 DIAGNOSIS — M25512 Pain in left shoulder: Secondary | ICD-10-CM

## 2019-03-06 DIAGNOSIS — M7661 Achilles tendinitis, right leg: Secondary | ICD-10-CM | POA: Diagnosis not present

## 2019-03-07 ENCOUNTER — Other Ambulatory Visit: Payer: Self-pay | Admitting: Registered Nurse

## 2019-03-07 DIAGNOSIS — M7660 Achilles tendinitis, unspecified leg: Secondary | ICD-10-CM

## 2019-03-07 MED ORDER — METHOCARBAMOL 500 MG PO TABS: 500.0000 mg | ORAL_TABLET | Freq: Two times a day (BID) | ORAL | 0 refills | Status: DC

## 2019-03-08 MED ORDER — MELOXICAM 15 MG PO TABS: 15.0000 mg | ORAL_TABLET | Freq: Every day | ORAL | 0 refills | Status: DC

## 2019-03-11 DIAGNOSIS — M25571 Pain in right ankle and joints of right foot: Secondary | ICD-10-CM | POA: Diagnosis not present

## 2019-03-17 ENCOUNTER — Other Ambulatory Visit: Payer: Self-pay | Admitting: Registered Nurse

## 2019-03-17 DIAGNOSIS — M25512 Pain in left shoulder: Secondary | ICD-10-CM

## 2019-03-17 DIAGNOSIS — F17200 Nicotine dependence, unspecified, uncomplicated: Secondary | ICD-10-CM

## 2019-03-20 ENCOUNTER — Other Ambulatory Visit: Payer: Self-pay | Admitting: Registered Nurse

## 2019-03-20 DIAGNOSIS — M25512 Pain in left shoulder: Secondary | ICD-10-CM

## 2019-03-21 NOTE — Telephone Encounter (Signed)
Pt scheduled  

## 2019-03-21 NOTE — Telephone Encounter (Signed)
Please schedule pt for an appointment to f/u on the arm pain. We have not had a recent visit concerning this issue. She will need to be seen prior to refills.

## 2019-03-24 ENCOUNTER — Ambulatory Visit (INDEPENDENT_AMBULATORY_CARE_PROVIDER_SITE_OTHER): Payer: BC Managed Care – PPO | Admitting: Registered Nurse

## 2019-03-24 ENCOUNTER — Encounter: Payer: Self-pay | Admitting: Registered Nurse

## 2019-03-24 ENCOUNTER — Other Ambulatory Visit: Payer: Self-pay

## 2019-03-24 VITALS — BP 138/87 | HR 91 | Temp 98.1°F | Ht 67.0 in | Wt 325.8 lb

## 2019-03-24 DIAGNOSIS — M722 Plantar fascial fibromatosis: Secondary | ICD-10-CM

## 2019-03-24 DIAGNOSIS — M25512 Pain in left shoulder: Secondary | ICD-10-CM

## 2019-03-24 DIAGNOSIS — M7660 Achilles tendinitis, unspecified leg: Secondary | ICD-10-CM

## 2019-03-24 DIAGNOSIS — M7592 Shoulder lesion, unspecified, left shoulder: Secondary | ICD-10-CM

## 2019-03-24 MED ORDER — METHOCARBAMOL 500 MG PO TABS: 500.0000 mg | ORAL_TABLET | Freq: Two times a day (BID) | ORAL | 1 refills | Status: DC

## 2019-03-24 MED ORDER — MELOXICAM 15 MG PO TABS: 15.0000 mg | ORAL_TABLET | Freq: Every day | ORAL | 1 refills | Status: DC

## 2019-03-24 NOTE — Patient Instructions (Addendum)
   Medical Weight Management: [phone]  I have referred you to Orthopedics, you can expect a call within the next week or two.  I have refilled your meloxicam and methocarbamol  Please wear supportive shoes as often as you can and massage the bottoms of your feet.  If you have lab work done today you will be contacted with your lab results within the next 2 weeks.  If you have not heard from [place] then please contact us. The fastest way to get your results is to register for My Chart.   IF you received an x-ray today, you will receive an invoice from [facility]. Please contact [facility] at [phone] with questions or concerns regarding your invoice.   IF you received labwork today, you will receive an invoice from [place]. Please contact [facility] at [phone] with questions or concerns regarding your invoice.   Our billing staff will not be able to assist you with questions regarding bills from these companies.  You will be contacted with the lab results as soon as they are available. The fastest way to get your results is to activate your My Chart account. Instructions are located on the last page of this paperwork. If you have not heard from [place] regarding the results in 2 weeks, please contact this office.

## 2019-03-24 NOTE — Progress Notes (Signed)
Left

## 2019-03-25 ENCOUNTER — Encounter: Payer: Self-pay | Admitting: Registered Nurse

## 2019-03-25 DIAGNOSIS — M722 Plantar fascial fibromatosis: Secondary | ICD-10-CM | POA: Insufficient documentation

## 2019-03-25 DIAGNOSIS — M7592 Shoulder lesion, unspecified, left shoulder: Secondary | ICD-10-CM | POA: Insufficient documentation

## 2019-03-25 NOTE — Progress Notes (Signed)
Acute Office Visit  Subjective:    Patient ID: Cristal Generous, female    DOB: [birth year], 49 y.o.   MRN: [id number]  Chief Complaint  Patient presents with  . left shoulder pain    ongoing 7-8 months. Not worse. it is the same type of pain   . Medication Refill    HPI Patient is in today for ongoing L shoulder pain  Requesting refill of meloxicam. Has been somewhat helpful. Has attended PT with some relief, however, same pain remains  Pt notes a supraspinatus tendon injury, which correlates with our findings on initial exam  After discussion with the patient today, we will refer to ortho.   No new symptoms. Pain does not radiate down arm. No weakness, numbness, tingling in extremities. No central nervous system symptoms.   No past medical history on file.  Past Surgical History:  Procedure Laterality Date  . CESAREAN SECTION    . GANGLION CYST EXCISION     x2    Family History  Problem Relation Age of Onset  . Cirrhosis Father   . Pulmonary embolism Sister   . Breast cancer Neg Hx     Social History   Socioeconomic History  . Marital status: Single    Spouse name: Not on file  . Number of children: 2  . Years of education: Not on file  . Highest education level: Not on file  Occupational History  . Occupation: CNA    Comment: [employer]  Tobacco Use  . Smoking status: Current Every Day Smoker    Packs/day: 0.25    Types: Cigarettes    Start date: 10/10/2007  . Smokeless tobacco: Never Used  Substance and Sexual Activity  . Alcohol use: Yes    Comment: 4 drinks a week - red wine  . Drug use: Yes    Types: Marijuana  . Sexual activity: Yes    Partners: Male    Birth control/protection: None, Condom    Comment: 10/04/14: Not sexually active x5 mo due to spouse having had a CVA  Other Topics Concern  . Not on file  Social History Narrative   Lives with daughter       Seatbelt - 100%      Guns in home - no   Social Determinants of Health    Financial Resource Strain: Low Risk   . Difficulty of Paying Living Expenses: Not hard at all  Food Insecurity: No Food Insecurity  . Worried About Charity fundraiser in the Last Year: Never true  . Ran Out of Food in the Last Year: Never true  Transportation Needs: No Transportation Needs  . Lack of Transportation (Medical): No  . Lack of Transportation (Non-Medical): No  Physical Activity: Sufficiently Active  . Days of Exercise per Week: 5 days  . Minutes of Exercise per Session: 30 min  Stress: No Stress Concern Present  . Feeling of Stress : Not at all  Social Connections: Moderately Isolated  . Frequency of Communication with Friends and Family: Once a week  . Frequency of Social Gatherings with Friends and Family: Once a week  . Attends Religious Services: More than 4 times per year  . Active Member of Clubs or Organizations: No  . Attends Archivist Meetings: Never  . Marital Status: Divorced  Human resources officer Violence: Not At Risk  . Fear of Current or Ex-Partner: No  . Emotionally Abused: No  . Physically Abused: No  . Sexually Abused: No  Outpatient Medications Prior to Visit  Medication Sig Dispense Refill  . cyanocobalamin 100 MCG tablet Take 100 mcg by mouth daily.    . Multiple Vitamins-Minerals (CENTRUM ADULTS) TABS Take 1 tablet by mouth daily. 90 tablet 4  . rosuvastatin (CRESTOR) 20 MG tablet Take 1 tablet (20 mg total) by mouth daily. 90 tablet 1  . valACYclovir (VALTREX) 1000 MG tablet Take 2 pills at onset of cold sore and then repeat in 12 hours for each outbreak 30 tablet 1  . meloxicam (MOBIC) 15 MG tablet Take 1 tablet (15 mg total) by mouth daily. 30 tablet 0  . methocarbamol (ROBAXIN) 500 MG tablet Take 1 tablet (500 mg total) by mouth 2 (two) times daily. 20 tablet 0  . ibuprofen (ADVIL) 800 MG tablet TAKE 1 TABLET BY MOUTH EVERY 8 HOURS AS NEEDED (Patient not taking: Reported on 03/24/2019) 90 tablet 1   No facility-administered  medications prior to visit.    No Known Allergies  Review of Systems  Constitutional: Negative.   HENT: Negative.   Eyes: Negative.   Respiratory: Negative.   Cardiovascular: Negative.   Gastrointestinal: Negative.   Endocrine: Negative.   Genitourinary: Negative.   Musculoskeletal: Positive for arthralgias (L shoulder, R heel).  Skin: Negative.   Allergic/Immunologic: Negative.   Neurological: Negative.   Hematological: Negative.   Psychiatric/Behavioral: Negative.   All other systems reviewed and are negative.      Objective:    Physical Exam Vitals and nursing note reviewed.  Constitutional:      General: She is not in acute distress.    Appearance: Normal appearance. She is obese. She is not ill-appearing, toxic-appearing or diaphoretic.  Cardiovascular:     Rate and Rhythm: Normal rate and regular rhythm.  Musculoskeletal:        General: Tenderness (L shoulder superior posterior, R heel along sole) present. No swelling, deformity or signs of injury.     Right lower leg: No edema.     Left lower leg: No edema.  Skin:    General: Skin is warm and dry.     Capillary Refill: Capillary refill takes less than 2 seconds.     Coloration: Skin is not jaundiced or pale.     Findings: No bruising, erythema, lesion or rash.  Neurological:     General: No focal deficit present.     Mental Status: She is alert and oriented to person, place, and time. Mental status is at baseline.     Cranial Nerves: No cranial nerve deficit.  Psychiatric:        Mood and Affect: Mood normal.        Behavior: Behavior normal.        Thought Content: Thought content normal.        Judgment: Judgment normal.     BP 138/87   Pulse 91   Temp 98.1 F (36.7 C) (Temporal)   Ht 5\' 7"  (1.702 m)   Wt (!) 325 lb 12.8 oz (147.8 kg)   LMP 02/24/2019   SpO2 99%   BMI 51.03 kg/m  Wt Readings from Last 3 Encounters:  03/24/19 (!) 325 lb 12.8 oz (147.8 kg)  11/06/18 (!) 323 lb (146.5 kg)   06/23/18 280 lb (127 kg)    There are no preventive care reminders to display for this patient.  There are no preventive care reminders to display for this patient.   Lab Results  Component Value Date   TSH 1.240 11/06/2018   Lab  Results  Component Value Date   WBC 10.8 11/06/2018   HGB 13.6 11/06/2018   HCT 42.0 11/06/2018   MCV 96 11/06/2018   PLT 276 11/06/2018   Lab Results  Component Value Date   NA 136 11/06/2018   K 4.4 11/06/2018   CO2 22 11/06/2018   GLUCOSE 91 11/06/2018   BUN 12 11/06/2018   CREATININE 0.87 11/06/2018   BILITOT 0.2 11/06/2018   ALKPHOS 89 11/06/2018   AST 22 11/06/2018   ALT 26 11/06/2018   PROT 6.7 11/06/2018   ALBUMIN 4.3 11/06/2018   CALCIUM 9.4 11/06/2018   Lab Results  Component Value Date   CHOL 158 11/06/2018   Lab Results  Component Value Date   HDL 39 (L) 11/06/2018   Lab Results  Component Value Date   LDLCALC 81 11/06/2018   Lab Results  Component Value Date   TRIG 189 (H) 11/06/2018   Lab Results  Component Value Date   CHOLHDL 4.1 11/06/2018   Lab Results  Component Value Date   HGBA1C 5.8 (H) 11/06/2018       Assessment & Plan:   Problem List Items Addressed This Visit      Musculoskeletal and Integument   Achilles tendinitis   Relevant Medications   meloxicam (MOBIC) 15 MG tablet   Other Relevant Orders   Ambulatory referral to Orthopedic Surgery    Other Visit Diagnoses    Supraspinatus tendinitis, left    -  Primary   Relevant Orders   Ambulatory referral to Orthopedic Surgery   Pain in joint of left shoulder       Relevant Medications   methocarbamol (ROBAXIN) 500 MG tablet   Other Relevant Orders   Ambulatory referral to Orthopedic Surgery       Meds ordered this encounter  Medications  . meloxicam (MOBIC) 15 MG tablet    Sig: Take 1 tablet (15 mg total) by mouth daily.    Dispense:  30 tablet    Refill:  1    Order Specific Question:   Supervising Provider    Answer:    Collie Siad A [id number]  . methocarbamol (ROBAXIN) 500 MG tablet    Sig: Take 1 tablet (500 mg total) by mouth 2 (two) times daily.    Dispense:  30 tablet    Refill:  1    Order Specific Question:   Supervising Provider    Answer:   Doristine Bosworth [id number]   PLAN  Refill meloxicam and robaxin  Will refer to ortho surg for further assessment  Discussed her likely plantar fasciitis and nonpharm relief for that - she will get supportive shoes and work on massaging feet regularly.  Patient encouraged to call clinic with any questions, comments, or concerns.   Janeece Agee, NP

## 2019-03-28 DIAGNOSIS — G5602 Carpal tunnel syndrome, left upper limb: Secondary | ICD-10-CM | POA: Diagnosis not present

## 2019-03-28 DIAGNOSIS — M5030 Other cervical disc degeneration, unspecified cervical region: Secondary | ICD-10-CM | POA: Diagnosis not present

## 2019-03-28 DIAGNOSIS — M25512 Pain in left shoulder: Secondary | ICD-10-CM | POA: Diagnosis not present

## 2019-03-29 DIAGNOSIS — Z20828 Contact with and (suspected) exposure to other viral communicable diseases: Secondary | ICD-10-CM | POA: Diagnosis not present

## 2019-03-29 DIAGNOSIS — Z1159 Encounter for screening for other viral diseases: Secondary | ICD-10-CM | POA: Diagnosis not present

## 2019-04-04 ENCOUNTER — Encounter: Payer: Self-pay | Admitting: Neurology

## 2019-04-07 DIAGNOSIS — Z1159 Encounter for screening for other viral diseases: Secondary | ICD-10-CM | POA: Diagnosis not present

## 2019-04-07 DIAGNOSIS — Z20828 Contact with and (suspected) exposure to other viral communicable diseases: Secondary | ICD-10-CM | POA: Diagnosis not present

## 2019-04-08 ENCOUNTER — Other Ambulatory Visit: Payer: Self-pay

## 2019-04-08 ENCOUNTER — Ambulatory Visit (INDEPENDENT_AMBULATORY_CARE_PROVIDER_SITE_OTHER): Payer: BC Managed Care – PPO | Admitting: Neurology

## 2019-04-08 DIAGNOSIS — M5412 Radiculopathy, cervical region: Secondary | ICD-10-CM

## 2019-04-08 DIAGNOSIS — G5602 Carpal tunnel syndrome, left upper limb: Secondary | ICD-10-CM

## 2019-04-08 NOTE — Procedures (Signed)
[facility]  [address], [address]  [place], [place] [address] Tel: [phone] Fax:  [phone] Test Date:  04/08/2019  Patient: Diana Garrison DOB: 1970/12/03 Physician: Nita Sickle, DO  Sex: Female Height: 5\' 7"  Ref Phys: , MD  ID#: Pati Gallo Temp: 33.0C Technician:    Patient Complaints: This is a 49 year old female referred for evaluation of left-sided neck pain and hand paresthesias.  NCV & EMG Findings: 2 electrodiagnostic testing of the left upper extremity shows:  1. Left median sensory response shows prolonged latency (6.5 ms) and reduced amplitude (7.4 V). 2. Left median motor response shows prolonged latency (5.9 ms).  Left ulnar motor responses within normal limits. 3. There is no evidence of active or chronic motor axonal loss changes affecting any of the tested muscles.  Motor unit configuration and recruitment pattern is within normal limits.  Impression: Left median neuropathy at or distal to the wrist (moderate-to-severe) consistent with a clinical diagnosis of carpal tunnel syndrome.     ___________________________ 52, DO    Nerve Conduction Studies Anti Sensory Summary Table   Site NR Peak (ms) Norm Peak (ms) P-T Amp (V) Norm P-T Amp  Left Median Anti Sensory (2nd Digit)  33C  Wrist    6.5 <3.4 7.4 >20  Left Ulnar Anti Sensory (5th Digit)  33C  Wrist    2.7 <3.1 36.2 >12   Motor Summary Table   Site NR Onset (ms) Norm Onset (ms) O-P Amp (mV) Norm O-P Amp Site1 Site2 Delta-0 (ms) Dist (cm) Vel (m/s) Norm Vel (m/s)  Left Median Motor (Abd Poll Brev)  33C  Wrist    5.9 <3.9 10.7 >6 Elbow Wrist 5.1 29.0 57 >50  Elbow    11.0  10.3         Left Ulnar Motor (Abd Dig Minimi)  33C  Wrist    2.1 <3.1 9.1 >7 B Elbow Wrist 4.2 26.0 62 >50  B Elbow    6.3  8.7  A Elbow B Elbow 1.5 10.0 67 >50  A Elbow    7.8  8.5          EMG   Side Muscle Ins Act Fibs Psw Fasc Number Recrt Dur Dur. Amp Amp. Poly Poly. Comment   Left 1stDorInt Nml Nml Nml Nml Nml Nml Nml Nml Nml Nml Nml Nml N/A  Left Abd Poll Brev Nml Nml Nml Nml Nml Nml Nml Nml Nml Nml Nml Nml N/A  Left PronatorTeres Nml Nml Nml Nml Nml Nml Nml Nml Nml Nml Nml Nml N/A  Left Biceps Nml Nml Nml Nml Nml Nml Nml Nml Nml Nml Nml Nml N/A  Left Triceps Nml Nml Nml Nml Nml Nml Nml Nml Nml Nml Nml Nml N/A  Left Deltoid Nml Nml Nml Nml Nml Nml Nml Nml Nml Nml Nml Nml N/A      Waveforms:

## 2019-04-09 ENCOUNTER — Encounter: Payer: Self-pay | Admitting: Registered Nurse

## 2019-04-10 DIAGNOSIS — Z20828 Contact with and (suspected) exposure to other viral communicable diseases: Secondary | ICD-10-CM | POA: Diagnosis not present

## 2019-04-10 DIAGNOSIS — Z1159 Encounter for screening for other viral diseases: Secondary | ICD-10-CM | POA: Diagnosis not present

## 2019-04-11 NOTE — Telephone Encounter (Signed)
What symptoms do you have? yeast infection   How long have you been sick? Since Sunday   Have you been seen for this problem? No   If your provider decides to give you a prescription, which pharmacy would you like for it to be sent to?   [id number] - [place], [place] - [address] AT [address]  Patient informed that this information will be sent to the clinical staff for review and that they should receive a follow up call.

## 2019-04-14 DIAGNOSIS — Z20828 Contact with and (suspected) exposure to other viral communicable diseases: Secondary | ICD-10-CM | POA: Diagnosis not present

## 2019-04-14 DIAGNOSIS — Z1159 Encounter for screening for other viral diseases: Secondary | ICD-10-CM | POA: Diagnosis not present

## 2019-04-17 DIAGNOSIS — Z1159 Encounter for screening for other viral diseases: Secondary | ICD-10-CM | POA: Diagnosis not present

## 2019-04-17 DIAGNOSIS — Z20828 Contact with and (suspected) exposure to other viral communicable diseases: Secondary | ICD-10-CM | POA: Diagnosis not present

## 2019-04-18 ENCOUNTER — Other Ambulatory Visit: Payer: Self-pay

## 2019-04-18 ENCOUNTER — Telehealth (INDEPENDENT_AMBULATORY_CARE_PROVIDER_SITE_OTHER): Payer: BC Managed Care – PPO | Admitting: Registered Nurse

## 2019-04-18 DIAGNOSIS — N898 Other specified noninflammatory disorders of vagina: Secondary | ICD-10-CM | POA: Diagnosis not present

## 2019-04-18 MED ORDER — FLUCONAZOLE 150 MG PO TABS: 150.0000 mg | ORAL_TABLET | Freq: Once | ORAL | 0 refills | Status: AC

## 2019-04-18 NOTE — Patient Instructions (Signed)
° ° ° °  If you have lab work done today you will be contacted with your lab results within the next 2 weeks.  If you have not heard from us then please contact us. The fastest way to get your results is to register for My Chart. ° ° °IF you received an x-ray today, you will receive an invoice from [place] [facility]. Please contact [facility] at [phone] with questions or concerns regarding your invoice.  ° °IF you received labwork today, you will receive an invoice from [facility]. Please contact [facility] at [phone] with questions or concerns regarding your invoice.  ° °Our billing staff will not be able to assist you with questions regarding bills from these companies. ° °You will be contacted with the lab results as soon as they are available. The fastest way to get your results is to activate your My Chart account. Instructions are located on the last page of this paperwork. If you have not heard from us regarding the results in 2 weeks, please contact this office. °  ° ° ° °

## 2019-04-18 NOTE — Progress Notes (Signed)
Patient started antibiotics on Monday after getting a tooth removed and on Thursday she started having the symptoms of Burning and itching. She stated she is not taking anything for the possible yeast infection.

## 2019-04-18 NOTE — Progress Notes (Signed)
Telemedicine Encounter- SOAP NOTE Established Patient  This telephone encounter was conducted with the patient's (or proxy's) verbal consent via audio telecommunications: yes  Patient was instructed to have this encounter in a suitably private space; and to only have persons present to whom they give permission to participate. In addition, patient identity was confirmed by use of name plus two identifiers (DOB and address).  I discussed the limitations, risks, security and privacy concerns of performing an evaluation and management service by telephone and the availability of in person appointments. I also discussed with the patient that there may be a patient responsible charge related to this service. The patient expressed understanding and agreed to proceed.  I spent a total of 11 minutes talking with the patient or their proxy.  No chief complaint on file.   Subjective   Diana Garrison is a 49 y.o. established patient. Telephone visit today for vaginal itching and burning  HPI Onset 2-3 days ago. Notes that last week she had an infected tooth for which she was put on antibiotics - first amoxicillin then clindamycin.  She has had vaginal burning and itching. No discharge or odor. No urinary symptoms. No suprapubic or flank pain. No systemic symptoms  Frequent candidal infections with abx use in past - familiar with symptoms.  No risk for STI at this time   Patient Active Problem List   Diagnosis Date Noted  . Supraspinatus tendinitis, left 03/25/2019  . Plantar fasciitis 03/25/2019  . Achilles tendinitis 01/28/2019  . Spider veins of both lower extremities 10/17/2016  . Back pain 09/04/2016  . Menorrhagia 06/06/2013  . Morbid obesity with BMI of 40.0-44.9, adult ([facility]) 06/06/2013  . Smoker 06/06/2013    No past medical history on file.  Current Outpatient Medications  Medication Sig Dispense Refill  . cyanocobalamin 100 MCG tablet Take 100 mcg by mouth daily.    .  meloxicam (MOBIC) 15 MG tablet Take 1 tablet (15 mg total) by mouth daily. 30 tablet 1  . methocarbamol (ROBAXIN) 500 MG tablet Take 1 tablet (500 mg total) by mouth 2 (two) times daily. 30 tablet 1  . Multiple Vitamins-Minerals (CENTRUM ADULTS) TABS Take 1 tablet by mouth daily. 90 tablet 4  . rosuvastatin (CRESTOR) 20 MG tablet Take 1 tablet (20 mg total) by mouth daily. 90 tablet 1  . valACYclovir (VALTREX) 1000 MG tablet Take 2 pills at onset of cold sore and then repeat in 12 hours for each outbreak 30 tablet 1  . fluconazole (DIFLUCAN) 150 MG tablet Take 1 tablet (150 mg total) by mouth once for 1 dose. 1 tablet 0  . ibuprofen (ADVIL) 800 MG tablet TAKE 1 TABLET BY MOUTH EVERY 8 HOURS AS NEEDED (Patient not taking: Reported on 03/24/2019) 90 tablet 1   No current facility-administered medications for this visit.    No Known Allergies  Social History   Socioeconomic History  . Marital status: Single    Spouse name: Not on file  . Number of children: 2  . Years of education: Not on file  . Highest education level: Not on file  Occupational History  . Occupation: CNA    Comment: [employer]  Tobacco Use  . Smoking status: Current Every Day Smoker    Packs/day: 0.25    Types: Cigarettes    Start date: 10/10/2007  . Smokeless tobacco: Never Used  Substance and Sexual Activity  . Alcohol use: Yes    Comment: 4 drinks a week - red wine  .  Drug use: Yes    Types: Marijuana  . Sexual activity: Yes    Partners: Male    Birth control/protection: None, Condom    Comment: 10/04/14: Not sexually active x5 mo due to spouse having had a CVA  Other Topics Concern  . Not on file  Social History Narrative   Lives with daughter       Seatbelt - 100%      Guns in home - no   Social Determinants of Health   Financial Resource Strain: Low Risk   . Difficulty of Paying Living Expenses: Not hard at all  Food Insecurity: No Food Insecurity  . Worried About Charity fundraiser in the  Last Year: Never true  . Ran Out of Food in the Last Year: Never true  Transportation Needs: No Transportation Needs  . Lack of Transportation (Medical): No  . Lack of Transportation (Non-Medical): No  Physical Activity: Sufficiently Active  . Days of Exercise per Week: 5 days  . Minutes of Exercise per Session: 30 min  Stress: No Stress Concern Present  . Feeling of Stress : Not at all  Social Connections: Moderately Isolated  . Frequency of Communication with Friends and Family: Once a week  . Frequency of Social Gatherings with Friends and Family: Once a week  . Attends Religious Services: More than 4 times per year  . Active Member of Clubs or Organizations: No  . Attends Archivist Meetings: Never  . Marital Status: Divorced  Human resources officer Violence: Not At Risk  . Fear of Current or Ex-Partner: No  . Emotionally Abused: No  . Physically Abused: No  . Sexually Abused: No    Review of Systems  Constitutional: Negative.   HENT: Negative.   Eyes: Negative.   Respiratory: Negative.   Cardiovascular: Negative.   Gastrointestinal: Negative.   Genitourinary:       Burning, itching  Musculoskeletal: Negative.   Skin: Negative.   Neurological: Negative.   Endo/Heme/Allergies: Negative.   Psychiatric/Behavioral: Negative.   All other systems reviewed and are negative.   Objective   Vitals as reported by the patient: There were no vitals filed for this visit.  Diagnoses and all orders for this visit:  Vaginal itching -     fluconazole (DIFLUCAN) 150 MG tablet; Take 1 tablet (150 mg total) by mouth once for 1 dose.   PLAN  Diflucan 150mg  PO once for vaginal candidal infection  Return to clinic if symptoms worsen, change, or fail to improve  Otherwise follow up as warranted  Patient encouraged to call clinic with any questions, comments, or concerns.   I discussed the assessment and treatment plan with the patient. The patient was provided an  opportunity to ask questions and all were answered. The patient agreed with the plan and demonstrated an understanding of the instructions.   The patient was advised to call back or seek an in-person evaluation if the symptoms worsen or if the condition fails to improve as anticipated.  I provided 11 minutes of non-face-to-face time during this encounter.  Maximiano Coss, NP  Primary Care at [facility]

## 2019-04-21 DIAGNOSIS — Z1159 Encounter for screening for other viral diseases: Secondary | ICD-10-CM | POA: Diagnosis not present

## 2019-04-21 DIAGNOSIS — Z20828 Contact with and (suspected) exposure to other viral communicable diseases: Secondary | ICD-10-CM | POA: Diagnosis not present

## 2019-04-24 DIAGNOSIS — Z1159 Encounter for screening for other viral diseases: Secondary | ICD-10-CM | POA: Diagnosis not present

## 2019-04-24 DIAGNOSIS — Z20828 Contact with and (suspected) exposure to other viral communicable diseases: Secondary | ICD-10-CM | POA: Diagnosis not present

## 2019-04-28 DIAGNOSIS — Z20828 Contact with and (suspected) exposure to other viral communicable diseases: Secondary | ICD-10-CM | POA: Diagnosis not present

## 2019-04-28 DIAGNOSIS — Z1159 Encounter for screening for other viral diseases: Secondary | ICD-10-CM | POA: Diagnosis not present

## 2019-04-30 ENCOUNTER — Encounter: Payer: BC Managed Care – PPO | Admitting: Neurology

## 2019-05-05 DIAGNOSIS — Z20828 Contact with and (suspected) exposure to other viral communicable diseases: Secondary | ICD-10-CM | POA: Diagnosis not present

## 2019-05-05 DIAGNOSIS — Z1159 Encounter for screening for other viral diseases: Secondary | ICD-10-CM | POA: Diagnosis not present

## 2019-05-12 DIAGNOSIS — Z1159 Encounter for screening for other viral diseases: Secondary | ICD-10-CM | POA: Diagnosis not present

## 2019-05-12 DIAGNOSIS — Z20828 Contact with and (suspected) exposure to other viral communicable diseases: Secondary | ICD-10-CM | POA: Diagnosis not present

## 2019-05-19 DIAGNOSIS — Z20828 Contact with and (suspected) exposure to other viral communicable diseases: Secondary | ICD-10-CM | POA: Diagnosis not present

## 2019-05-19 DIAGNOSIS — Z1159 Encounter for screening for other viral diseases: Secondary | ICD-10-CM | POA: Diagnosis not present

## 2019-05-22 ENCOUNTER — Other Ambulatory Visit: Payer: Self-pay | Admitting: Registered Nurse

## 2019-05-22 DIAGNOSIS — R52 Pain, unspecified: Secondary | ICD-10-CM

## 2019-05-26 DIAGNOSIS — Z1159 Encounter for screening for other viral diseases: Secondary | ICD-10-CM | POA: Diagnosis not present

## 2019-05-26 DIAGNOSIS — Z20828 Contact with and (suspected) exposure to other viral communicable diseases: Secondary | ICD-10-CM | POA: Diagnosis not present

## 2019-06-02 DIAGNOSIS — Z20828 Contact with and (suspected) exposure to other viral communicable diseases: Secondary | ICD-10-CM | POA: Diagnosis not present

## 2019-06-02 DIAGNOSIS — Z1159 Encounter for screening for other viral diseases: Secondary | ICD-10-CM | POA: Diagnosis not present

## 2019-06-09 DIAGNOSIS — Z1159 Encounter for screening for other viral diseases: Secondary | ICD-10-CM | POA: Diagnosis not present

## 2019-06-09 DIAGNOSIS — Z20828 Contact with and (suspected) exposure to other viral communicable diseases: Secondary | ICD-10-CM | POA: Diagnosis not present

## 2019-06-16 DIAGNOSIS — Z1159 Encounter for screening for other viral diseases: Secondary | ICD-10-CM | POA: Diagnosis not present

## 2019-06-16 DIAGNOSIS — Z20828 Contact with and (suspected) exposure to other viral communicable diseases: Secondary | ICD-10-CM | POA: Diagnosis not present

## 2019-06-17 ENCOUNTER — Other Ambulatory Visit: Payer: Self-pay | Admitting: Registered Nurse

## 2019-06-17 DIAGNOSIS — M25512 Pain in left shoulder: Secondary | ICD-10-CM

## 2019-06-23 DIAGNOSIS — Z20828 Contact with and (suspected) exposure to other viral communicable diseases: Secondary | ICD-10-CM | POA: Diagnosis not present

## 2019-06-23 DIAGNOSIS — Z1159 Encounter for screening for other viral diseases: Secondary | ICD-10-CM | POA: Diagnosis not present

## 2019-06-23 DIAGNOSIS — G5602 Carpal tunnel syndrome, left upper limb: Secondary | ICD-10-CM | POA: Diagnosis not present

## 2019-06-30 DIAGNOSIS — Z1159 Encounter for screening for other viral diseases: Secondary | ICD-10-CM | POA: Diagnosis not present

## 2019-06-30 DIAGNOSIS — Z20828 Contact with and (suspected) exposure to other viral communicable diseases: Secondary | ICD-10-CM | POA: Diagnosis not present

## 2019-07-05 ENCOUNTER — Other Ambulatory Visit: Payer: Self-pay | Admitting: Registered Nurse

## 2019-07-05 DIAGNOSIS — M7660 Achilles tendinitis, unspecified leg: Secondary | ICD-10-CM

## 2019-07-07 DIAGNOSIS — Z20828 Contact with and (suspected) exposure to other viral communicable diseases: Secondary | ICD-10-CM | POA: Diagnosis not present

## 2019-07-07 DIAGNOSIS — Z1159 Encounter for screening for other viral diseases: Secondary | ICD-10-CM | POA: Diagnosis not present

## 2019-07-07 NOTE — Telephone Encounter (Signed)
Patient is requesting a refill of the following medications: Requested Prescriptions   Pending Prescriptions Disp Refills  . meloxicam (MOBIC) 15 MG tablet [Pharmacy Med Name: MELOXICAM 15 MG TABLET] 30 tablet 1    Sig: TAKE 1 TABLET BY MOUTH EVERY DAY    Date of patient request: 07/05/2019 Last office visit: 04/18/2019 Date of last refill: 03/24/2019 Last refill amount: 30 tablets  Follow up time period per chart: N/A

## 2019-07-14 ENCOUNTER — Telehealth (INDEPENDENT_AMBULATORY_CARE_PROVIDER_SITE_OTHER): Payer: BC Managed Care – PPO | Admitting: Registered Nurse

## 2019-07-14 ENCOUNTER — Encounter: Payer: Self-pay | Admitting: Registered Nurse

## 2019-07-14 ENCOUNTER — Other Ambulatory Visit: Payer: Self-pay

## 2019-07-14 VITALS — Temp 98.7°F

## 2019-07-14 DIAGNOSIS — J302 Other seasonal allergic rhinitis: Secondary | ICD-10-CM | POA: Diagnosis not present

## 2019-07-14 DIAGNOSIS — Z20828 Contact with and (suspected) exposure to other viral communicable diseases: Secondary | ICD-10-CM | POA: Diagnosis not present

## 2019-07-14 DIAGNOSIS — Z1159 Encounter for screening for other viral diseases: Secondary | ICD-10-CM | POA: Diagnosis not present

## 2019-07-14 DIAGNOSIS — J0110 Acute frontal sinusitis, unspecified: Secondary | ICD-10-CM | POA: Diagnosis not present

## 2019-07-14 MED ORDER — MONTELUKAST SODIUM 10 MG PO TABS: 10.0000 mg | ORAL_TABLET | Freq: Every day | ORAL | 3 refills | Status: DC

## 2019-07-14 MED ORDER — FLUTICASONE PROPIONATE 50 MCG/ACT NA SUSP: 2.0000 | Freq: Every day | NASAL | 6 refills | Status: DC

## 2019-07-14 MED ORDER — AMOXICILLIN-POT CLAVULANATE 875-125 MG PO TABS: 1.0000 | ORAL_TABLET | Freq: Two times a day (BID) | ORAL | 0 refills | Status: DC

## 2019-07-14 NOTE — Patient Instructions (Signed)
° ° ° °  If you have lab work done today you will be contacted with your lab results within the next 2 weeks.  If you have not heard from us then please contact us. The fastest way to get your results is to register for My Chart. ° ° °IF you received an x-ray today, you will receive an invoice from [facility]. Please contact [facility] at [phone] with questions or concerns regarding your invoice.  ° °IF you received labwork today, you will receive an invoice from [facility]. Please contact [facility] at [phone] with questions or concerns regarding your invoice.  ° °Our billing staff will not be able to assist you with questions regarding bills from these companies. ° °You will be contacted with the lab results as soon as they are available. The fastest way to get your results is to activate your My Chart account. Instructions are located on the last page of this paperwork. If you have not heard from us regarding the results in 2 weeks, please contact this office. °  ° ° ° °

## 2019-07-15 ENCOUNTER — Telehealth: Payer: BC Managed Care – PPO | Admitting: Registered Nurse

## 2019-07-21 DIAGNOSIS — Z1159 Encounter for screening for other viral diseases: Secondary | ICD-10-CM | POA: Diagnosis not present

## 2019-07-21 DIAGNOSIS — Z20828 Contact with and (suspected) exposure to other viral communicable diseases: Secondary | ICD-10-CM | POA: Diagnosis not present

## 2019-07-28 DIAGNOSIS — Z1159 Encounter for screening for other viral diseases: Secondary | ICD-10-CM | POA: Diagnosis not present

## 2019-07-28 DIAGNOSIS — Z20828 Contact with and (suspected) exposure to other viral communicable diseases: Secondary | ICD-10-CM | POA: Diagnosis not present

## 2019-08-02 ENCOUNTER — Other Ambulatory Visit: Payer: Self-pay | Admitting: Registered Nurse

## 2019-08-02 DIAGNOSIS — F17200 Nicotine dependence, unspecified, uncomplicated: Secondary | ICD-10-CM

## 2019-08-04 DIAGNOSIS — Z20828 Contact with and (suspected) exposure to other viral communicable diseases: Secondary | ICD-10-CM | POA: Diagnosis not present

## 2019-08-04 DIAGNOSIS — Z1159 Encounter for screening for other viral diseases: Secondary | ICD-10-CM | POA: Diagnosis not present

## 2019-08-04 NOTE — Telephone Encounter (Signed)
Patient is requesting a refill of the following medications: Requested Prescriptions   Pending Prescriptions Disp Refills  . buPROPion (ZYBAN) 150 MG 12 hr tablet [Pharmacy Med Name: BUPROPION HCL SR 150 MG TABLET] 30 tablet     Sig: TAKE 1 TABLET BY MOUTH EVERY DAY    Date of patient request:08/03/2019 Last office visit:07/14/2019 Date of last refill: 11/06/2018 Last refill amount:  Follow up time period per chart: N/A

## 2019-08-11 DIAGNOSIS — Z20828 Contact with and (suspected) exposure to other viral communicable diseases: Secondary | ICD-10-CM | POA: Diagnosis not present

## 2019-08-11 DIAGNOSIS — Z1159 Encounter for screening for other viral diseases: Secondary | ICD-10-CM | POA: Diagnosis not present

## 2019-08-17 NOTE — Progress Notes (Signed)
Telemedicine Encounter- SOAP NOTE Established Patient  This telephone encounter was conducted with the patient's (or proxy's) verbal consent via audio telecommunications: yes  Patient was instructed to have this encounter in a suitably private space; and to only have persons present to whom they give permission to participate. In addition, patient identity was confirmed by use of name plus two identifiers (DOB and address).  I discussed the limitations, risks, security and privacy concerns of performing an evaluation and management service by telephone and the availability of in person appointments. I also discussed with the patient that there may be a patient responsible charge related to this service. The patient expressed understanding and agreed to proceed.  I spent a total of 13 minutes talking with the patient or their proxy.  Chief Complaint  Patient presents with  . Sinus Problem    patient states she thinks she is experiencing stuffy nose, cough, sore throat. Per patient she has taken some allergy pills OTC but no relief at all.    Subjective   Diana Garrison is a 49 y.o. established patient. Telephone visit today for sinus pressure  HPI For the past week, has been experiencing sinus pressure/pain, nasal congestion and rhinorrhea, nonproductive cough, and sore throat. Has tried OTC cold and flu relief without any effect Symptoms seem to be gradually worsening No sick contacts Denies headaches, sensory changes, shob, fevers/chills, nvd.  Patient Active Problem List   Diagnosis Date Noted  . Supraspinatus tendinitis, left 03/25/2019  . Plantar fasciitis 03/25/2019  . Achilles tendinitis 01/28/2019  . Spider veins of both lower extremities 10/17/2016  . Back pain 09/04/2016  . Menorrhagia 06/06/2013  . Morbid obesity with BMI of 40.0-44.9, adult ([place]) 06/06/2013  . Smoker 06/06/2013    No past medical history on file.  Current Outpatient Medications  Medication Sig  Dispense Refill  . cyanocobalamin 100 MCG tablet Take 100 mcg by mouth daily.    . meloxicam (MOBIC) 15 MG tablet TAKE 1 TABLET BY MOUTH EVERY DAY 30 tablet 1  . methocarbamol (ROBAXIN) 500 MG tablet TAKE 1 TABLET BY MOUTH TWICE A DAY 30 tablet 1  . rosuvastatin (CRESTOR) 20 MG tablet Take 1 tablet (20 mg total) by mouth daily. 90 tablet 1  . amoxicillin-clavulanate (AUGMENTIN) 875-125 MG tablet Take 1 tablet by mouth 2 (two) times daily. 14 tablet 0  . buPROPion (ZYBAN) 150 MG 12 hr tablet TAKE 1 TABLET BY MOUTH EVERY DAY 180 tablet 0  . fluticasone (FLONASE) 50 MCG/ACT nasal spray Place 2 sprays into both nostrils daily. 16 g 6  . ibuprofen (ADVIL) 800 MG tablet TAKE 1 TABLET BY MOUTH EVERY 8 HOURS AS NEEDED (Patient not taking: Reported on 07/14/2019) 90 tablet 1  . montelukast (SINGULAIR) 10 MG tablet Take 1 tablet (10 mg total) by mouth at bedtime. 30 tablet 3  . Multiple Vitamins-Minerals (CENTRUM ADULTS) TABS Take 1 tablet by mouth daily. 90 tablet 4  . valACYclovir (VALTREX) 1000 MG tablet Take 2 pills at onset of cold sore and then repeat in 12 hours for each outbreak (Patient not taking: Reported on 07/14/2019) 30 tablet 1   No current facility-administered medications for this visit.    No Known Allergies  Social History   Socioeconomic History  . Marital status: Single    Spouse name: Not on file  . Number of children: 2  . Years of education: Not on file  . Highest education level: Not on file  Occupational History  . Occupation:  CNA    Comment: [employer]  Tobacco Use  . Smoking status: Current Every Day Smoker    Packs/day: 0.25    Types: Cigarettes    Start date: 10/10/2007  . Smokeless tobacco: Never Used  Substance and Sexual Activity  . Alcohol use: Yes    Comment: 4 drinks a week - red wine  . Drug use: Yes    Types: Marijuana  . Sexual activity: Yes    Partners: Male    Birth control/protection: None, Condom    Comment: 10/04/14: Not sexually active x5 mo  due to spouse having had a CVA  Other Topics Concern  . Not on file  Social History Narrative   Lives with daughter       Seatbelt - 100%      Guns in home - no   Social Determinants of Health   Financial Resource Strain:   . Difficulty of Paying Living Expenses:   Food Insecurity:   . Worried About Programme researcher, broadcasting/film/video in the Last Year:   . Barista in the Last Year:   Transportation Needs:   . Freight forwarder (Medical):   Marland Kitchen Lack of Transportation (Non-Medical):   Physical Activity:   . Days of Exercise per Week:   . Minutes of Exercise per Session:   Stress:   . Feeling of Stress :   Social Connections:   . Frequency of Communication with Friends and Family:   . Frequency of Social Gatherings with Friends and Family:   . Attends Religious Services:   . Active Member of Clubs or Organizations:   . Attends Banker Meetings:   Marland Kitchen Marital Status:   Intimate Partner Violence:   . Fear of Current or Ex-Partner:   . Emotionally Abused:   Marland Kitchen Physically Abused:   . Sexually Abused:     ROS Per hpi   Objective   Vitals as reported by the patient: Today's Vitals   07/14/19 1152  Temp: 98.7 F (37.1 C)    Diana Garrison was seen today for sinus problem.  Diagnoses and all orders for this visit:  Acute frontal sinusitis, recurrence not specified -     amoxicillin-clavulanate (AUGMENTIN) 875-125 MG tablet; Take 1 tablet by mouth 2 (two) times daily. -     fluticasone (FLONASE) 50 MCG/ACT nasal spray; Place 2 sprays into both nostrils daily.  Seasonal allergies -     montelukast (SINGULAIR) 10 MG tablet; Take 1 tablet (10 mg total) by mouth at bedtime.   PLAN  Bacterial sinusitis complicated by seasonal allergies  Augmentin, flonase, montelukast  Return if symptoms persist or worsen  Patient encouraged to call clinic with any questions, comments, or concerns.  I discussed the assessment and treatment plan with the patient. The patient was  provided an opportunity to ask questions and all were answered. The patient agreed with the plan and demonstrated an understanding of the instructions.   The patient was advised to call back or seek an in-person evaluation if the symptoms worsen or if the condition fails to improve as anticipated.  I provided 13 minutes of non-face-to-face time during this encounter.  Diana Agee, NP  Primary Care at [facility]

## 2019-08-18 DIAGNOSIS — Z1159 Encounter for screening for other viral diseases: Secondary | ICD-10-CM | POA: Diagnosis not present

## 2019-08-18 DIAGNOSIS — Z20828 Contact with and (suspected) exposure to other viral communicable diseases: Secondary | ICD-10-CM | POA: Diagnosis not present

## 2019-08-25 DIAGNOSIS — Z20828 Contact with and (suspected) exposure to other viral communicable diseases: Secondary | ICD-10-CM | POA: Diagnosis not present

## 2019-08-25 DIAGNOSIS — Z1159 Encounter for screening for other viral diseases: Secondary | ICD-10-CM | POA: Diagnosis not present

## 2019-08-26 ENCOUNTER — Other Ambulatory Visit: Payer: Self-pay | Admitting: Registered Nurse

## 2019-08-26 DIAGNOSIS — R52 Pain, unspecified: Secondary | ICD-10-CM

## 2019-08-26 DIAGNOSIS — M7660 Achilles tendinitis, unspecified leg: Secondary | ICD-10-CM

## 2019-08-26 MED ORDER — IBUPROFEN 800 MG PO TABS: 800.0000 mg | ORAL_TABLET | Freq: Three times a day (TID) | ORAL | 1 refills | Status: DC | PRN

## 2019-08-26 NOTE — Telephone Encounter (Signed)
Thank you for the advise, I will let the patient know.

## 2019-08-26 NOTE — Telephone Encounter (Signed)
Pt cannot use both of these medications simultaneously. Presents too large a risk for adverse GI side effect like ulcer or bleed.  Will refill ibuprofen in separate orders encounter  Thanks,  [name], NP

## 2019-08-26 NOTE — Telephone Encounter (Signed)
Spoke with the patient to let her know [name] NP refilled Ibuprofen, but not Meloxicam because it can cause GI ulcer or bleed. Patient understood.

## 2019-08-26 NOTE — Telephone Encounter (Signed)
Good morning, the patient is requesting refill on Meloxicam and Ibuprofen for aching pain. The referral has been sent to [facility] on 03/25/2019 by [facility] in referral. I am not sure if you want Rxs refilled. Thank you.

## 2019-09-04 DIAGNOSIS — Z20828 Contact with and (suspected) exposure to other viral communicable diseases: Secondary | ICD-10-CM | POA: Diagnosis not present

## 2019-09-04 DIAGNOSIS — Z1159 Encounter for screening for other viral diseases: Secondary | ICD-10-CM | POA: Diagnosis not present

## 2019-09-14 ENCOUNTER — Encounter (HOSPITAL_COMMUNITY): Payer: Self-pay | Admitting: Emergency Medicine

## 2019-09-14 ENCOUNTER — Ambulatory Visit (HOSPITAL_COMMUNITY)
Admission: EM | Admit: 2019-09-14 | Discharge: 2019-09-14 | Disposition: A | Discharge status: Home / Self Care | Payer: 59

## 2019-09-14 ENCOUNTER — Other Ambulatory Visit: Payer: Self-pay

## 2019-09-14 DIAGNOSIS — M25571 Pain in right ankle and joints of right foot: Secondary | ICD-10-CM | POA: Diagnosis not present

## 2019-09-14 DIAGNOSIS — M7661 Achilles tendinitis, right leg: Secondary | ICD-10-CM

## 2019-09-14 MED ORDER — NAPROXEN 500 MG PO TABS
500.0000 mg | ORAL_TABLET | Freq: Two times a day (BID) | ORAL | 0 refills | Status: DC
Start: 2019-09-14 — End: 2019-11-18

## 2019-09-14 NOTE — ED Triage Notes (Signed)
Patient reports she was playing with grandchildren yesterday.  Patient felt a "tug" at "back of ankle".  Patient has pain in back of right ankle.  Patient has a history of this type of injury

## 2019-09-14 NOTE — ED Provider Notes (Signed)
[facility]   MRN: [id number] DOB: 12-May-1970  Subjective:   Diana Garrison is a 49 y.o. female presenting for 1 day history of recurrent right ankle pain.  Patient states that she was playing with her grandchildren yesterday and felt that she reaggravated her Achilles tendon.  Denies fall, injury, hearing a popping or snapping sound.  She is able to move her foot but with significant pain.  Has previously seen Ortho for this and did better with the boot as opposed to an Ace wrap or ankle brace.  She is requesting this today.  Has not tried medications for relief.  No current facility-administered medications for this encounter.  Current Outpatient Medications:  .  acetaminophen (TYLENOL) 325 MG tablet, Take 650 mg by mouth every 6 (six) hours as needed., Disp: , Rfl:  .  amoxicillin-clavulanate (AUGMENTIN) 875-125 MG tablet, Take 1 tablet by mouth 2 (two) times daily., Disp: 14 tablet, Rfl: 0 .  buPROPion (ZYBAN) 150 MG 12 hr tablet, TAKE 1 TABLET BY MOUTH EVERY DAY, Disp: 180 tablet, Rfl: 0 .  cyanocobalamin 100 MCG tablet, Take 100 mcg by mouth daily., Disp: , Rfl:  .  fluticasone (FLONASE) 50 MCG/ACT nasal spray, Place 2 sprays into both nostrils daily., Disp: 16 g, Rfl: 6 .  ibuprofen (ADVIL) 800 MG tablet, Take 1 tablet (800 mg total) by mouth every 8 (eight) hours as needed., Disp: 90 tablet, Rfl: 1 .  methocarbamol (ROBAXIN) 500 MG tablet, TAKE 1 TABLET BY MOUTH TWICE A DAY, Disp: 30 tablet, Rfl: 1 .  montelukast (SINGULAIR) 10 MG tablet, Take 1 tablet (10 mg total) by mouth at bedtime., Disp: 30 tablet, Rfl: 3 .  Multiple Vitamins-Minerals (CENTRUM ADULTS) TABS, Take 1 tablet by mouth daily., Disp: 90 tablet, Rfl: 4 .  rosuvastatin (CRESTOR) 20 MG tablet, Take 1 tablet (20 mg total) by mouth daily., Disp: 90 tablet, Rfl: 1 .  valACYclovir (VALTREX) 1000 MG tablet, Take 2 pills at onset of cold sore and then repeat in 12 hours for each outbreak (Patient not taking: Reported  on 07/14/2019), Disp: 30 tablet, Rfl: 1   No Known Allergies  History reviewed. No pertinent past medical history.   Past Surgical History:  Procedure Laterality Date  . CESAREAN SECTION    . GANGLION CYST EXCISION     x2    Family History  Problem Relation Age of Onset  . Cirrhosis Father   . Pulmonary embolism Sister   . Breast cancer Neg Hx     Social History   Tobacco Use  . Smoking status: Current Every Day Smoker    Packs/day: 0.25    Types: Cigarettes    Start date: 10/10/2007  . Smokeless tobacco: Never Used  Vaping Use  . Vaping Use: Never used  Substance Use Topics  . Alcohol use: Yes    Comment: 4 drinks a week - red wine  . Drug use: Not Currently    Types: Marijuana    ROS   Objective:   Vitals: BP (!) 141/86 (BP Location: Right Arm) Comment (BP Location): regular cuff, forearm  Pulse 67   Temp 98.7 F (37.1 C) (Oral)   Resp (!) 21   LMP 09/05/2019   SpO2 100%   Physical Exam Constitutional:      General: She is not in acute distress.    Appearance: Normal appearance. She is well-developed. She is not ill-appearing, toxic-appearing or diaphoretic.  HENT:     Head: Normocephalic and atraumatic.  Nose: Nose normal.     Mouth/Throat:     Mouth: Mucous membranes are moist.     Pharynx: Oropharynx is clear.  Eyes:     General: No scleral icterus.       Right eye: No discharge.        Left eye: No discharge.     Extraocular Movements: Extraocular movements intact.     Conjunctiva/sclera: Conjunctivae normal.     Pupils: Pupils are equal, round, and reactive to light.  Cardiovascular:     Rate and Rhythm: Normal rate.  Pulmonary:     Effort: Pulmonary effort is normal.  Musculoskeletal:     Left ankle: No swelling, deformity, ecchymosis or lacerations. Tenderness (Posteriorly) present. Decreased range of motion.     Left Achilles Tendon: Tenderness present. No defects. Thompson's test negative.  Skin:    General: Skin is warm and dry.    Neurological:     General: No focal deficit present.     Mental Status: She is alert and oriented to person, place, and time.  Psychiatric:        Mood and Affect: Mood normal.        Behavior: Behavior normal.        Thought Content: Thought content normal.        Judgment: Judgment normal.      Assessment and Plan :   PDMP not reviewed this encounter.  1. Acute right ankle pain   2. Tendonitis, Achilles, right     Counseled patient on general management of Achilles tendinitis.  Provided her with a boot, recommended Naprosyn for pain control.  Follow-up with Ortho ASAP. Counseled patient on potential for adverse effects with medications prescribed/recommended today, ER and return-to-clinic precautions discussed, patient verbalized understanding.    Wallis Bamberg, PA-C 09/14/19 1341

## 2019-09-27 ENCOUNTER — Other Ambulatory Visit: Payer: Self-pay | Admitting: Registered Nurse

## 2019-09-27 DIAGNOSIS — J302 Other seasonal allergic rhinitis: Secondary | ICD-10-CM

## 2019-10-03 ENCOUNTER — Other Ambulatory Visit: Payer: Self-pay | Admitting: Registered Nurse

## 2019-10-03 DIAGNOSIS — E7800 Pure hypercholesterolemia, unspecified: Secondary | ICD-10-CM

## 2019-10-14 ENCOUNTER — Ambulatory Visit (INDEPENDENT_AMBULATORY_CARE_PROVIDER_SITE_OTHER): Payer: 59 | Admitting: Registered Nurse

## 2019-10-14 ENCOUNTER — Other Ambulatory Visit: Payer: Self-pay

## 2019-10-14 ENCOUNTER — Encounter: Payer: Self-pay | Admitting: Registered Nurse

## 2019-10-14 VITALS — BP 117/77 | HR 80 | Temp 97.6°F | Ht 67.0 in | Wt 325.0 lb

## 2019-10-14 DIAGNOSIS — M7592 Shoulder lesion, unspecified, left shoulder: Secondary | ICD-10-CM

## 2019-10-14 DIAGNOSIS — M79622 Pain in left upper arm: Secondary | ICD-10-CM

## 2019-10-14 MED ORDER — PREDNISONE 10 MG (21) PO TBPK: ORAL_TABLET | ORAL | 0 refills | Status: DC

## 2019-10-14 NOTE — Progress Notes (Signed)
Acute Office Visit  Subjective:    Patient ID: Diana Garrison, female    DOB: 13-Aug-1970, 49 y.o.   MRN: [id number]  Chief Complaint  Patient presents with  . Arm Pain    Pt stated that she has been experiencing Lt aarm pain for the past 1 1/2 month. Pt stated that she had her her arm some time last year and was getting better up until now. It hurts from the elbow up.    HPI Patient is in today for left arm pain  Radiating down from shoulder. Not like previous pain. No involvement of back or neck. Worst towards shoulder, less so approaching elbow.  Was noted to have arthritis in shoulder when seeing ortho early 2021 - declined surgical intervention at that time Has been using OTCs, leftover meloxicam and methocarbamol with some relief.  No numbness, weakness, tingling, chest pain, limited rom, or other concerns  No past medical history on file.  Past Surgical History:  Procedure Laterality Date  . CESAREAN SECTION    . GANGLION CYST EXCISION     x2    Family History  Problem Relation Age of Onset  . Cirrhosis Father   . Pulmonary embolism Sister   . Breast cancer Neg Hx     Social History   Socioeconomic History  . Marital status: Married    Spouse name: Not on file  . Number of children: 2  . Years of education: Not on file  . Highest education level: Not on file  Occupational History  . Occupation: CNA    Comment: [employer]  Tobacco Use  . Smoking status: Current Every Day Smoker    Packs/day: 0.25    Types: Cigarettes    Start date: 10/10/2007  . Smokeless tobacco: Never Used  Vaping Use  . Vaping Use: Never used  Substance and Sexual Activity  . Alcohol use: Yes    Comment: 4 drinks a week - red wine  . Drug use: Not Currently    Types: Marijuana  . Sexual activity: Yes    Partners: Male    Birth control/protection: None, Condom    Comment: 10/04/14: Not sexually active x5 mo due to spouse having had a CVA  Other Topics Concern  . Not on  file  Social History Narrative   Lives with daughter       Seatbelt - 100%      Guns in home - no   Social Determinants of Health   Financial Resource Strain:   . Difficulty of Paying Living Expenses:   Food Insecurity:   . Worried About Programme researcher, broadcasting/film/video in the Last Year:   . Barista in the Last Year:   Transportation Needs:   . Freight forwarder (Medical):   Marland Kitchen Lack of Transportation (Non-Medical):   Physical Activity:   . Days of Exercise per Week:   . Minutes of Exercise per Session:   Stress:   . Feeling of Stress :   Social Connections:   . Frequency of Communication with Friends and Family:   . Frequency of Social Gatherings with Friends and Family:   . Attends Religious Services:   . Active Member of Clubs or Organizations:   . Attends Banker Meetings:   Marland Kitchen Marital Status:   Intimate Partner Violence:   . Fear of Current or Ex-Partner:   . Emotionally Abused:   Marland Kitchen Physically Abused:   . Sexually Abused:  Outpatient Medications Prior to Visit  Medication Sig Dispense Refill  . acetaminophen (TYLENOL) 325 MG tablet Take 650 mg by mouth every 6 (six) hours as needed.    Marland Kitchen amoxicillin-clavulanate (AUGMENTIN) 875-125 MG tablet Take 1 tablet by mouth 2 (two) times daily. 14 tablet 0  . buPROPion (ZYBAN) 150 MG 12 hr tablet TAKE 1 TABLET BY MOUTH EVERY DAY 180 tablet 0  . cyanocobalamin 100 MCG tablet Take 100 mcg by mouth daily.    . fluticasone (FLONASE) 50 MCG/ACT nasal spray Place 2 sprays into both nostrils daily. 16 g 6  . ibuprofen (ADVIL) 800 MG tablet Take 1 tablet (800 mg total) by mouth every 8 (eight) hours as needed. 90 tablet 1  . methocarbamol (ROBAXIN) 500 MG tablet TAKE 1 TABLET BY MOUTH TWICE A DAY 30 tablet 1  . montelukast (SINGULAIR) 10 MG tablet TAKE 1 TABLET BY MOUTH EVERYDAY AT BEDTIME 30 tablet 3  . naproxen (NAPROSYN) 500 MG tablet Take 1 tablet (500 mg total) by mouth 2 (two) times daily with a meal. 30 tablet 0    . rosuvastatin (CRESTOR) 20 MG tablet TAKE 1 TABLET BY MOUTH EVERY DAY 90 tablet 0  . valACYclovir (VALTREX) 1000 MG tablet Take 2 pills at onset of cold sore and then repeat in 12 hours for each outbreak 30 tablet 1  . Multiple Vitamins-Minerals (CENTRUM ADULTS) TABS Take 1 tablet by mouth daily. 90 tablet 4   No facility-administered medications prior to visit.    No Known Allergies  Review of Systems Pertinent positives and negatives noted in HPI    Objective:    Physical Exam Vitals and nursing note reviewed.  Constitutional:      Appearance: Normal appearance. She is obese.  Musculoskeletal:        General: No swelling, tenderness, deformity or signs of injury. Normal range of motion.     Right lower leg: No edema.     Left lower leg: No edema.  Skin:    General: Skin is warm and dry.     Capillary Refill: Capillary refill takes less than 2 seconds.     Coloration: Skin is not jaundiced or pale.     Findings: No bruising, erythema, lesion or rash.  Neurological:     General: No focal deficit present.     Mental Status: She is alert and oriented to person, place, and time. Mental status is at baseline.  Psychiatric:        Mood and Affect: Mood normal.        Behavior: Behavior normal.        Thought Content: Thought content normal.        Judgment: Judgment normal.     BP 117/77 (BP Location: Right Arm, Patient Position: Sitting, Cuff Size: Large)   Pulse 80   Temp 97.6 F (36.4 C) (Temporal)   Ht 5\' 7"  (1.702 m)   Wt (!) 325 lb (147.4 kg)   LMP 10/03/2019   SpO2 98%   BMI 50.90 kg/m  Wt Readings from Last 3 Encounters:  10/14/19 (!) 325 lb (147.4 kg)  03/24/19 (!) 325 lb 12.8 oz (147.8 kg)  11/06/18 (!) 323 lb (146.5 kg)    Health Maintenance Due  Topic Date Due  . COVID-19 Vaccine (1) Never done  . INFLUENZA VACCINE  10/12/2019    There are no preventive care reminders to display for this patient.   Lab Results  Component Value Date   TSH  1.240 11/06/2018  Lab Results  Component Value Date   WBC 10.8 11/06/2018   HGB 13.6 11/06/2018   HCT 42.0 11/06/2018   MCV 96 11/06/2018   PLT 276 11/06/2018   Lab Results  Component Value Date   NA 136 11/06/2018   K 4.4 11/06/2018   CO2 22 11/06/2018   GLUCOSE 91 11/06/2018   BUN 12 11/06/2018   CREATININE 0.87 11/06/2018   BILITOT 0.2 11/06/2018   ALKPHOS 89 11/06/2018   AST 22 11/06/2018   ALT 26 11/06/2018   PROT 6.7 11/06/2018   ALBUMIN 4.3 11/06/2018   CALCIUM 9.4 11/06/2018   Lab Results  Component Value Date   CHOL 158 11/06/2018   Lab Results  Component Value Date   HDL 39 (L) 11/06/2018   Lab Results  Component Value Date   LDLCALC 81 11/06/2018   Lab Results  Component Value Date   TRIG 189 (H) 11/06/2018   Lab Results  Component Value Date   CHOLHDL 4.1 11/06/2018   Lab Results  Component Value Date   HGBA1C 5.8 (H) 11/06/2018       Assessment & Plan:   Problem List Items Addressed This Visit      Musculoskeletal and Integument   Supraspinatus tendinitis, left   Relevant Medications   predniSONE (STERAPRED UNI-PAK 21 TAB) 10 MG (21) TBPK tablet   Other Relevant Orders   Ambulatory referral to Physical Therapy    Other Visit Diagnoses    Pain in left upper arm    -  Primary   Relevant Medications   predniSONE (STERAPRED UNI-PAK 21 TAB) 10 MG (21) TBPK tablet   Other Relevant Orders   Ambulatory referral to Physical Therapy       Meds ordered this encounter  Medications  . predniSONE (STERAPRED UNI-PAK 21 TAB) 10 MG (21) TBPK tablet    Sig: Take per package instructions. Do not skip doses. Finish entire supply.    Dispense:  1 each    Refill:  0    Order Specific Question:   Supervising Provider    Answer:   Myles Lipps [[id number]]   PLAN  OA vs tendonitis  Steroid taper, continue OTC analgesics, refer to PT  Discussed RICE method  Patient encouraged to call clinic with any questions, comments, or  concerns.   Janeece Agee, NP

## 2019-10-14 NOTE — Patient Instructions (Signed)
° ° ° °  If you have lab work done today you will be contacted with your lab results within the next 2 weeks.  If you have not heard from us then please contact us. The fastest way to get your results is to register for My Chart. ° ° °IF you received an x-ray today, you will receive an invoice from [facility]. Please contact [facility] at [phone] with questions or concerns regarding your invoice.  ° °IF you received labwork today, you will receive an invoice from [facility]. Please contact [facility] at [phone] with questions or concerns regarding your invoice.  ° °Our billing staff will not be able to assist you with questions regarding bills from these companies. ° °You will be contacted with the lab results as soon as they are available. The fastest way to get your results is to activate your My Chart account. Instructions are located on the last page of this paperwork. If you have not heard from us regarding the results in 2 weeks, please contact this office. °  ° ° ° °

## 2019-10-28 ENCOUNTER — Encounter: Payer: Self-pay | Admitting: Registered Nurse

## 2019-10-28 ENCOUNTER — Other Ambulatory Visit: Payer: Self-pay

## 2019-10-28 DIAGNOSIS — N898 Other specified noninflammatory disorders of vagina: Secondary | ICD-10-CM

## 2019-11-05 ENCOUNTER — Other Ambulatory Visit: Payer: Self-pay

## 2019-11-05 ENCOUNTER — Ambulatory Visit: Payer: 59 | Admitting: Physical Therapy

## 2019-11-11 ENCOUNTER — Ambulatory Visit (HOSPITAL_COMMUNITY)
Admission: EM | Admit: 2019-11-11 | Discharge: 2019-11-11 | Disposition: A | Discharge status: Home / Self Care | Payer: 59 | Attending: Family Medicine | Admitting: Family Medicine

## 2019-11-11 ENCOUNTER — Other Ambulatory Visit: Payer: Self-pay

## 2019-11-11 ENCOUNTER — Encounter (HOSPITAL_COMMUNITY): Payer: Self-pay

## 2019-11-11 DIAGNOSIS — H6592 Unspecified nonsuppurative otitis media, left ear: Secondary | ICD-10-CM

## 2019-11-11 DIAGNOSIS — J3089 Other allergic rhinitis: Secondary | ICD-10-CM

## 2019-11-11 MED ORDER — PREDNISONE 50 MG PO TABS
ORAL_TABLET | ORAL | 0 refills | Status: DC
Start: 2019-11-11 — End: 2020-07-27

## 2019-11-11 NOTE — ED Provider Notes (Signed)
[facility]    CSN: [id number] Arrival date & time: 11/11/19  3299      History   Chief Complaint Chief Complaint  Patient presents with  . Otalgia    HPI Diana Garrison is a 49 y.o. female.   Here today with sudden onset this morning of left ear pain, pressure and feeling like there was water sloshing around. Denies other associated sxs, including sore throat, post nasal drainage, cough, fever, chills, headache. Not trying anything OTC for sxs. Endorses a hx of seasonal allergies, prescribed singulair, antihistamine and flonase but rarely takes per patient and has not taken anytime recently. No significant hx of ear issues.      History reviewed. No pertinent past medical history.  Patient Active Problem List   Diagnosis Date Noted  . Supraspinatus tendinitis, left 03/25/2019  . Plantar fasciitis 03/25/2019  . Achilles tendinitis 01/28/2019  . Spider veins of both lower extremities 10/17/2016  . Back pain 09/04/2016  . Menorrhagia 06/06/2013  . Morbid obesity with BMI of 40.0-44.9, adult (HCC) 06/06/2013  . Smoker 06/06/2013    Past Surgical History:  Procedure Laterality Date  . CESAREAN SECTION    . GANGLION CYST EXCISION     x2    OB History    Gravida  3   Para  3   Term  2   Preterm  1   AB  0   Living  2     SAB  0   TAB  0   Ectopic  0   Multiple  0   Live Births  1            Home Medications    Prior to Admission medications   Medication Sig Start Date End Date Taking? Authorizing Provider  acetaminophen (TYLENOL) 325 MG tablet Take 650 mg by mouth every 6 (six) hours as needed.    [provider]  amoxicillin-clavulanate (AUGMENTIN) 875-125 MG tablet Take 1 tablet by mouth 2 (two) times daily. 07/14/19   Janeece Agee, NP  buPROPion (ZYBAN) 150 MG 12 hr tablet TAKE 1 TABLET BY MOUTH EVERY DAY 08/04/19   Janeece Agee, NP  cyanocobalamin 100 MCG tablet Take 100 mcg by mouth daily.    [provider]  fluticasone (FLONASE) 50 MCG/ACT nasal spray Place 2 sprays into both nostrils daily. 07/14/19   Janeece Agee, NP  ibuprofen (ADVIL) 800 MG tablet Take 1 tablet (800 mg total) by mouth every 8 (eight) hours as needed. 08/26/19   Janeece Agee, NP  methocarbamol (ROBAXIN) 500 MG tablet TAKE 1 TABLET BY MOUTH TWICE A DAY 06/17/19   Janeece Agee, NP  montelukast (SINGULAIR) 10 MG tablet TAKE 1 TABLET BY MOUTH EVERYDAY AT BEDTIME 09/29/19   Janeece Agee, NP  naproxen (NAPROSYN) 500 MG tablet Take 1 tablet (500 mg total) by mouth 2 (two) times daily with a meal. 09/14/19   Wallis Bamberg, PA-C  predniSONE (DELTASONE) 50 MG tablet Take 1 tablet daily with breakfast for 3 days 11/11/19   Particia Nearing, PA-C  rosuvastatin (CRESTOR) 20 MG tablet TAKE 1 TABLET BY MOUTH EVERY DAY 10/06/19   Janeece Agee, NP  valACYclovir (VALTREX) 1000 MG tablet Take 2 pills at onset of cold sore and then repeat in 12 hours for each outbreak 10/13/17   Valarie Cones, Dema Severin, PA-C    Family History Family History  Problem Relation Age of Onset  . Cirrhosis Father   . Pulmonary embolism Sister   .  Breast cancer Neg Hx     Social History Social History   Tobacco Use  . Smoking status: Current Every Day Smoker    Packs/day: 0.25    Types: Cigarettes    Start date: 10/10/2007  . Smokeless tobacco: Never Used  Vaping Use  . Vaping Use: Never used  Substance Use Topics  . Alcohol use: Yes    Comment: 4 drinks a week - red wine  . Drug use: Not Currently    Types: Marijuana     Allergies   Patient has no known allergies.   Review of Systems Review of Systems  Constitutional: Negative.   HENT: Positive for ear pain and hearing loss (muffled).   Eyes: Negative.   Respiratory: Negative.   Cardiovascular: Negative.   Gastrointestinal: Negative.   Genitourinary: Negative.   Musculoskeletal: Negative.   Skin: Negative.   Neurological: Negative.   Psychiatric/Behavioral: Negative.       Physical Exam Triage Vital Signs ED Triage Vitals  Enc Vitals Group     BP 11/11/19 0834 (!) 135/95     Pulse Rate 11/11/19 0834 84     Resp 11/11/19 0834 18     Temp 11/11/19 0834 98 F (36.7 C)     Temp Source 11/11/19 0834 Oral     SpO2 11/11/19 0834 98 %     Weight --      Height --      Head Circumference --      Peak Flow --      Pain Score 11/11/19 0833 7     Pain Loc --      Pain Edu? --      Excl. in GC? --    No data found.  Updated Vital Signs BP (!) 135/95 (BP Location: Left Wrist)   Pulse 84   Temp 98 F (36.7 C) (Oral)   Resp 18   SpO2 98%   Visual Acuity Right Eye Distance:   Left Eye Distance:   Bilateral Distance:    Right Eye Near:   Left Eye Near:    Bilateral Near:     Physical Exam Vitals and nursing note reviewed.  Constitutional:      Appearance: Normal appearance. She is not ill-appearing.  HENT:     Head: Atraumatic.     Right Ear: Tympanic membrane and ear canal normal. There is no impacted cerumen.     Left Ear: Ear canal normal. There is no impacted cerumen.     Ears:     Comments: Left TM injected with moderate clear effusion behind TM    Nose: Nose normal. No rhinorrhea.     Mouth/Throat:     Mouth: Mucous membranes are moist.     Pharynx: Oropharynx is clear.  Eyes:     Extraocular Movements: Extraocular movements intact.     Conjunctiva/sclera: Conjunctivae normal.  Cardiovascular:     Rate and Rhythm: Normal rate and regular rhythm.     Heart sounds: Normal heart sounds.  Pulmonary:     Effort: Pulmonary effort is normal.     Breath sounds: Normal breath sounds.  Abdominal:     General: Bowel sounds are normal. There is no distension.     Palpations: Abdomen is soft.     Tenderness: There is no abdominal tenderness.  Musculoskeletal:        General: Normal range of motion.     Cervical back: Normal range of motion and neck supple.  Skin:  General: Skin is warm and dry.  Neurological:     Mental Status:  She is alert and oriented to person, place, and time.  Psychiatric:        Mood and Affect: Mood normal.        Thought Content: Thought content normal.        Judgment: Judgment normal.     UC Treatments / Results  Labs (all labs ordered are listed, but only abnormal results are displayed) Labs Reviewed - No data to display  EKG   Radiology No results found.  Procedures Procedures (including critical care time)  Medications Ordered in UC Medications - No data to display  Initial Impression / Assessment and Plan / UC Course  I have reviewed the triage vital signs and the nursing notes.  Pertinent labs & imaging results that were available during my care of the patient were reviewed by me and considered in my medical decision making (see chart for details).     Sxs consistent with otitis effusion left ear - tx with 3 day prednisone burst, restart consistent use of singulair, flonase BID, and antihistamine. Follow up if not improving in next few days.   Final Clinical Impressions(s) / UC Diagnoses   Final diagnoses:  Otitis media with effusion, left  Seasonal allergic rhinitis due to other allergic trigger   Discharge Instructions   None    ED Prescriptions    Medication Sig Dispense Auth. Provider   predniSONE (DELTASONE) 50 MG tablet Take 1 tablet daily with breakfast for 3 days 3 tablet Particia Nearing, [place]     PDMP not reviewed this encounter.   Roosvelt Maser [place], [place] 11/11/19 [phone]

## 2019-11-11 NOTE — ED Triage Notes (Signed)
Pt presents with left era pain since this morning. Pt states she feel "water" inside the ear. Denies fever, chills.

## 2019-11-14 ENCOUNTER — Other Ambulatory Visit: Payer: Self-pay

## 2019-11-14 ENCOUNTER — Encounter: Payer: Self-pay | Admitting: Registered Nurse

## 2019-11-14 ENCOUNTER — Encounter (HOSPITAL_COMMUNITY): Payer: Self-pay | Admitting: Emergency Medicine

## 2019-11-14 ENCOUNTER — Ambulatory Visit (INDEPENDENT_AMBULATORY_CARE_PROVIDER_SITE_OTHER): Payer: 59 | Admitting: Registered Nurse

## 2019-11-14 ENCOUNTER — Ambulatory Visit (HOSPITAL_COMMUNITY)
Admission: EM | Admit: 2019-11-14 | Discharge: 2019-11-14 | Disposition: A | Discharge status: Home / Self Care | Payer: 59 | Attending: Internal Medicine | Admitting: Internal Medicine

## 2019-11-14 ENCOUNTER — Telehealth: Payer: Self-pay | Admitting: Registered Nurse

## 2019-11-14 VITALS — BP 121/77 | HR 80 | Temp 98.0°F | Resp 17 | Ht 67.0 in | Wt 325.2 lb

## 2019-11-14 DIAGNOSIS — Z13220 Encounter for screening for lipoid disorders: Secondary | ICD-10-CM | POA: Diagnosis not present

## 2019-11-14 DIAGNOSIS — E559 Vitamin D deficiency, unspecified: Secondary | ICD-10-CM | POA: Diagnosis not present

## 2019-11-14 DIAGNOSIS — R748 Abnormal levels of other serum enzymes: Secondary | ICD-10-CM | POA: Diagnosis not present

## 2019-11-14 DIAGNOSIS — Z1329 Encounter for screening for other suspected endocrine disorder: Secondary | ICD-10-CM | POA: Diagnosis not present

## 2019-11-14 DIAGNOSIS — Z13228 Encounter for screening for other metabolic disorders: Secondary | ICD-10-CM

## 2019-11-14 DIAGNOSIS — Z130 Encounter for screening for diseases of the blood and blood-forming organs and certain disorders involving the immune mechanism: Secondary | ICD-10-CM

## 2019-11-14 DIAGNOSIS — S61212A Laceration without foreign body of right middle finger without damage to nail, initial encounter: Secondary | ICD-10-CM | POA: Diagnosis not present

## 2019-11-14 MED ORDER — TETANUS-DIPHTH-ACELL PERTUSSIS 5-2.5-18.5 LF-MCG/0.5 IM SUSP
INTRAMUSCULAR | Status: AC
  Filled 2019-11-14: qty 0.5

## 2019-11-14 MED ORDER — TETANUS-DIPHTH-ACELL PERTUSSIS 5-2.5-18.5 LF-MCG/0.5 IM SUSP
0.5000 mL | Freq: Once | INTRAMUSCULAR | Status: AC
  Administered 2019-11-14: 0.5 mL via INTRAMUSCULAR

## 2019-11-14 NOTE — ED Triage Notes (Signed)
Pt presents to [facility] for assessment after cutting the middle finger of her right hand on a hatchet in the sink while doing dishes.  Patient states last tetanus 2006

## 2019-11-14 NOTE — Telephone Encounter (Signed)
Was there a medication that was supposed to be sent in for patient? I have not seen anything sent as of yet today.

## 2019-11-14 NOTE — Telephone Encounter (Signed)
Pt called stated her Rx that was proscribed at today's visit is not at pharmacy. Pt would like a call when this is sent in. Please advise  Please send to this pharmacy.  [id number] - [place],  - [address]

## 2019-11-14 NOTE — Patient Instructions (Signed)
° ° ° °  If you have lab work done today you will be contacted with your lab results within the next 2 weeks.  If you have not heard from us then please contact us. The fastest way to get your results is to register for My Chart. ° ° °IF you received an x-ray today, you will receive an invoice from [facility]. Please contact [facility] at [phone] with questions or concerns regarding your invoice.  ° °IF you received labwork today, you will receive an invoice from [facility]. Please contact [facility] at [phone] with questions or concerns regarding your invoice.  ° °Our billing staff will not be able to assist you with questions regarding bills from these companies. ° °You will be contacted with the lab results as soon as they are available. The fastest way to get your results is to activate your My Chart account. Instructions are located on the last page of this paperwork. If you have not heard from us regarding the results in 2 weeks, please contact this office. °  ° ° ° °

## 2019-11-14 NOTE — Progress Notes (Signed)
Established Patient Office Visit  Subjective:  Patient ID: Diana Garrison, female    DOB: 06/08/1970  Age: 49 y.o. MRN: [id number]  CC:  Chief Complaint  Patient presents with  . Annual Exam    Patient states she is here for a CPE and states it feels like a bug is in her left ear.    HPI Diana Garrison presents for CPE  Notes foreign body sensation in her L ear. Some muffled hearing. At times sounds like water is in ear. Was seen at Urgent Care for this sensation a few days ago - was given prednisone burst for clear fluid behind TM. This has not helped sensation. No other upper respiratory symptoms, no drainage from ear, no headaches or neuro concerns.  Otherwise feeling well.  Past Medical History:  Diagnosis Date  . Hyperlipidemia    Phreesia 11/12/2019    Past Surgical History:  Procedure Laterality Date  . CESAREAN SECTION    . CESAREAN SECTION N/A    Phreesia 11/12/2019  . GANGLION CYST EXCISION     x2    Family History  Problem Relation Age of Onset  . Cirrhosis Father   . Pulmonary embolism Sister   . Breast cancer Neg Hx     Social History   Socioeconomic History  . Marital status: Married    Spouse name: Not on file  . Number of children: 2  . Years of education: Not on file  . Highest education level: Not on file  Occupational History  . Occupation: CNA    Comment: [employer]  Tobacco Use  . Smoking status: Current Every Day Smoker    Packs/day: 0.25    Types: Cigarettes    Start date: 10/10/2007  . Smokeless tobacco: Never Used  Vaping Use  . Vaping Use: Never used  Substance and Sexual Activity  . Alcohol use: Yes    Comment: 4 drinks a week - red wine  . Drug use: Not Currently    Types: Marijuana  . Sexual activity: Yes    Partners: Male    Birth control/protection: None, Condom    Comment: 10/04/14: Not sexually active x5 mo due to spouse having had a CVA  Other Topics Concern  . Not on file  Social History Narrative    Lives with daughter       Seatbelt - 100%      Guns in home - no   Social Determinants of Health   Financial Resource Strain:   . Difficulty of Paying Living Expenses: Not on file  Food Insecurity:   . Worried About Programme researcher, broadcasting/film/videounning Out of Food in the Last Year: Not on file  . Ran Out of Food in the Last Year: Not on file  Transportation Needs:   . Lack of Transportation (Medical): Not on file  . Lack of Transportation (Non-Medical): Not on file  Physical Activity:   . Days of Exercise per Week: Not on file  . Minutes of Exercise per Session: Not on file  Stress:   . Feeling of Stress : Not on file  Social Connections:   . Frequency of Communication with Friends and Family: Not on file  . Frequency of Social Gatherings with Friends and Family: Not on file  . Attends Religious Services: Not on file  . Active Member of Clubs or Organizations: Not on file  . Attends BankerClub or Organization Meetings: Not on file  . Marital Status: Not on file  Intimate Partner Violence:   .  Fear of Current or Ex-Partner: Not on file  . Emotionally Abused: Not on file  . Physically Abused: Not on file  . Sexually Abused: Not on file    Outpatient Medications Prior to Visit  Medication Sig Dispense Refill  . acetaminophen (TYLENOL) 325 MG tablet Take 650 mg by mouth every 6 (six) hours as needed.    Marland Kitchen amoxicillin-clavulanate (AUGMENTIN) 875-125 MG tablet Take 1 tablet by mouth 2 (two) times daily. 14 tablet 0  . cyanocobalamin 100 MCG tablet Take 100 mcg by mouth daily.    . fluticasone (FLONASE) 50 MCG/ACT nasal spray Place 2 sprays into both nostrils daily. 16 g 6  . ibuprofen (ADVIL) 800 MG tablet Take 1 tablet (800 mg total) by mouth every 8 (eight) hours as needed. 90 tablet 1  . methocarbamol (ROBAXIN) 500 MG tablet TAKE 1 TABLET BY MOUTH TWICE A DAY 30 tablet 1  . montelukast (SINGULAIR) 10 MG tablet TAKE 1 TABLET BY MOUTH EVERYDAY AT BEDTIME 30 tablet 3  . naproxen (NAPROSYN) 500 MG tablet Take 1  tablet (500 mg total) by mouth 2 (two) times daily with a meal. 30 tablet 0  . rosuvastatin (CRESTOR) 20 MG tablet TAKE 1 TABLET BY MOUTH EVERY DAY 90 tablet 0  . valACYclovir (VALTREX) 1000 MG tablet Take 2 pills at onset of cold sore and then repeat in 12 hours for each outbreak 30 tablet 1  . buPROPion (ZYBAN) 150 MG 12 hr tablet TAKE 1 TABLET BY MOUTH EVERY DAY (Patient not taking: Reported on 11/14/2019) 180 tablet 0  . predniSONE (DELTASONE) 50 MG tablet Take 1 tablet daily with breakfast for 3 days (Patient not taking: Reported on 11/14/2019) 3 tablet 0   No facility-administered medications prior to visit.    No Known Allergies  ROS Review of Systems  Constitutional: Negative.   HENT: Negative.   Eyes: Negative.   Respiratory: Negative.   Cardiovascular: Negative.   Gastrointestinal: Negative.   Endocrine: Negative.   Genitourinary: Negative.   Musculoskeletal: Negative.   Skin: Negative.   Allergic/Immunologic: Negative.   Neurological: Negative.   Hematological: Negative.   Psychiatric/Behavioral: Negative.   All other systems reviewed and are negative.     Objective:    Physical Exam Vitals and nursing note reviewed.  Constitutional:      General: She is not in acute distress.    Appearance: Normal appearance. She is obese. She is not ill-appearing, toxic-appearing or diaphoretic.  HENT:     Head: Normocephalic and atraumatic.     Right Ear: Hearing, tympanic membrane, ear canal and external ear normal. There is no impacted cerumen. No foreign body.     Left Ear: Hearing, tympanic membrane and external ear normal. There is no impacted cerumen. A foreign body (small spider in canal. alive, moving. removed with sterile plastic loop and lavage. rest of exam wnl.) is present.     Nose: Nose normal. No congestion or rhinorrhea.     Mouth/Throat:     Mouth: Mucous membranes are moist.     Pharynx: Oropharynx is clear. No oropharyngeal exudate or posterior oropharyngeal  erythema.  Eyes:     General: No scleral icterus.       Right eye: No discharge.        Left eye: No discharge.     Extraocular Movements: Extraocular movements intact.     Conjunctiva/sclera: Conjunctivae normal.     Pupils: Pupils are equal, round, and reactive to light.  Neck:  Vascular: No carotid bruit.  Cardiovascular:     Rate and Rhythm: Normal rate and regular rhythm.     Pulses: Normal pulses.     Heart sounds: Normal heart sounds. No murmur heard.  No friction rub. No gallop.   Pulmonary:     Effort: Pulmonary effort is normal. No respiratory distress.     Breath sounds: Normal breath sounds. No stridor. No wheezing, rhonchi or rales.  Chest:     Chest wall: No tenderness.  Abdominal:     General: Bowel sounds are normal. There is no distension.     Palpations: Abdomen is soft. There is no mass.     Tenderness: There is no abdominal tenderness. There is no right CVA tenderness, left CVA tenderness, guarding or rebound.     Hernia: No hernia is present.  Musculoskeletal:        General: No swelling, tenderness, deformity or signs of injury. Normal range of motion.     Cervical back: Normal range of motion and neck supple. No rigidity or tenderness.     Right lower leg: No edema.     Left lower leg: No edema.  Lymphadenopathy:     Cervical: No cervical adenopathy.  Skin:    General: Skin is warm and dry.     Capillary Refill: Capillary refill takes less than 2 seconds.     Coloration: Skin is not jaundiced or pale.     Findings: No bruising, erythema, lesion or rash.  Neurological:     General: No focal deficit present.     Mental Status: She is alert and oriented to person, place, and time. Mental status is at baseline.     Cranial Nerves: No cranial nerve deficit.     Sensory: No sensory deficit.     Motor: No weakness.     Coordination: Coordination normal.     Gait: Gait normal.     Deep Tendon Reflexes: Reflexes normal.  Psychiatric:        Mood and  Affect: Mood normal.        Behavior: Behavior normal.        Thought Content: Thought content normal.        Judgment: Judgment normal.     BP 121/77   Pulse 80   Temp 98 F (36.7 C) (Temporal)   Resp 17   Ht 5\' 7"  (1.702 m)   Wt (!) 325 lb 3.2 oz (147.5 kg)   SpO2 97%   BMI 50.93 kg/m  Wt Readings from Last 3 Encounters:  11/14/19 (!) 325 lb 3.2 oz (147.5 kg)  10/14/19 (!) 325 lb (147.4 kg)  03/24/19 (!) 325 lb 12.8 oz (147.8 kg)     There are no preventive care reminders to display for this patient.  There are no preventive care reminders to display for this patient.  Lab Results  Component Value Date   TSH 1.240 11/06/2018   Lab Results  Component Value Date   WBC 10.8 11/06/2018   HGB 13.6 11/06/2018   HCT 42.0 11/06/2018   MCV 96 11/06/2018   PLT 276 11/06/2018   Lab Results  Component Value Date   NA 136 11/06/2018   K 4.4 11/06/2018   CO2 22 11/06/2018   GLUCOSE 91 11/06/2018   BUN 12 11/06/2018   CREATININE 0.87 11/06/2018   BILITOT 0.2 11/06/2018   ALKPHOS 89 11/06/2018   AST 22 11/06/2018   ALT 26 11/06/2018   PROT 6.7 11/06/2018   ALBUMIN  4.3 11/06/2018   CALCIUM 9.4 11/06/2018   Lab Results  Component Value Date   CHOL 158 11/06/2018   Lab Results  Component Value Date   HDL 39 (L) 11/06/2018   Lab Results  Component Value Date   LDLCALC 81 11/06/2018   Lab Results  Component Value Date   TRIG 189 (H) 11/06/2018   Lab Results  Component Value Date   CHOLHDL 4.1 11/06/2018   Lab Results  Component Value Date   HGBA1C 5.8 (H) 11/06/2018      Assessment & Plan:   Problem List Items Addressed This Visit    None    Visit Diagnoses    Elevated vitamin B12 level    -  Primary   Relevant Orders   CBC With Differential   Vitamin B12   Vitamin D deficiency       Relevant Orders   Vitamin D, 25-hydroxy   Screening for endocrine, metabolic and immunity disorder       Relevant Orders   CBC With Differential    Comprehensive metabolic panel   TSH   Hemoglobin A1c   Lipid screening       Relevant Orders   Lipid panel      No orders of the defined types were placed in this encounter.   Follow-up: No follow-ups on file.   PLAN  Spider removed from ear without difficulty.  Otherwise, exam wnl  Labs collected, will follow up as warranted  Return annually for CPE  Encouraged diet and exercise for weight management  Patient encouraged to call clinic with any questions, comments, or concerns.  Janeece Agee, NP

## 2019-11-14 NOTE — Discharge Instructions (Addendum)
You were seen at the Urgent Care for cut on your finger. Follow up here in 5-7 days for suture removal.   If you haven't already, sign up for My Chart to have easy access to your labs results, and communication with your primary care physician.  Dr. Rachael Darby

## 2019-11-14 NOTE — ED Provider Notes (Signed)
[facility]    CSN: [id number] Arrival date & time: 11/14/19  1812      History   Chief Complaint Chief Complaint  Patient presents with  . Laceration    HPI Diana Garrison is a 49 y.o. female.   HPI     Patient presents for evaluation of a laceration to the middle finger. Injury occurred 2 hours ago. The mechanism of the wound was a meat cleaver knife that was in the dish water.  She was using the meat cleaver to chop a kabasa sauage. The patient reports pain in right middle finger that is rated 9/10. Marland Kitchen There were no other injuries. Patient states her finger bled for 25 minutes.  Patient denies extremity injury, numbness and weakness. The tetanus status is more than 10 years ago.   Past Medical History:  Diagnosis Date  . Hyperlipidemia    Phreesia 11/12/2019    Patient Active Problem List   Diagnosis Date Noted  . Supraspinatus tendinitis, left 03/25/2019  . Plantar fasciitis 03/25/2019  . Achilles tendinitis 01/28/2019  . Spider veins of both lower extremities 10/17/2016  . Back pain 09/04/2016  . Menorrhagia 06/06/2013  . Morbid obesity with BMI of 40.0-44.9, adult (HCC) 06/06/2013  . Smoker 06/06/2013    Past Surgical History:  Procedure Laterality Date  . CESAREAN SECTION    . CESAREAN SECTION N/A    Phreesia 11/12/2019  . GANGLION CYST EXCISION     x2    OB History    Gravida  3   Para  3   Term  2   Preterm  1   AB  0   Living  2     SAB  0   TAB  0   Ectopic  0   Multiple  0   Live Births  1            Home Medications    Prior to Admission medications   Medication Sig Start Date End Date Taking? Authorizing Provider  acetaminophen (TYLENOL) 325 MG tablet Take 650 mg by mouth every 6 (six) hours as needed.    [provider]  amoxicillin-clavulanate (AUGMENTIN) 875-125 MG tablet Take 1 tablet by mouth 2 (two) times daily. 07/14/19   Janeece Agee, NP  buPROPion (ZYBAN) 150 MG 12 hr tablet TAKE 1  TABLET BY MOUTH EVERY DAY Patient not taking: Reported on 11/14/2019 08/04/19   Janeece Agee, NP  cyanocobalamin 100 MCG tablet Take 100 mcg by mouth daily.    [provider]  fluticasone (FLONASE) 50 MCG/ACT nasal spray Place 2 sprays into both nostrils daily. 07/14/19   Janeece Agee, NP  ibuprofen (ADVIL) 800 MG tablet Take 1 tablet (800 mg total) by mouth every 8 (eight) hours as needed. 08/26/19   Janeece Agee, NP  methocarbamol (ROBAXIN) 500 MG tablet TAKE 1 TABLET BY MOUTH TWICE A DAY 06/17/19   Janeece Agee, NP  montelukast (SINGULAIR) 10 MG tablet TAKE 1 TABLET BY MOUTH EVERYDAY AT BEDTIME 09/29/19   Janeece Agee, NP  naproxen (NAPROSYN) 500 MG tablet Take 1 tablet (500 mg total) by mouth 2 (two) times daily with a meal. 09/14/19   Wallis Bamberg, PA-C  predniSONE (DELTASONE) 50 MG tablet Take 1 tablet daily with breakfast for 3 days Patient not taking: Reported on 11/14/2019 11/11/19   Particia Nearing, PA-C  rosuvastatin (CRESTOR) 20 MG tablet TAKE 1 TABLET BY MOUTH EVERY DAY 10/06/19   Janeece Agee, NP  valACYclovir (VALTREX) 1000  MG tablet Take 2 pills at onset of cold sore and then repeat in 12 hours for each outbreak 10/13/17   Valarie Cones, Dema Severin, PA-C    Family History Family History  Problem Relation Age of Onset  . Cirrhosis Father   . Pulmonary embolism Sister   . Breast cancer Neg Hx     Social History Social History   Tobacco Use  . Smoking status: Current Every Day Smoker    Packs/day: 0.25    Types: Cigarettes    Start date: 10/10/2007  . Smokeless tobacco: Never Used  Vaping Use  . Vaping Use: Never used  Substance Use Topics  . Alcohol use: Yes    Comment: 4 drinks a week - red wine  . Drug use: Not Currently    Types: Marijuana     Allergies   Patient has no known allergies.   Review of Systems Review of Systems: See HPI    Physical Exam Triage Vital Signs ED Triage Vitals  Enc Vitals Group     BP 11/14/19 1932 (!) 154/100      Pulse Rate 11/14/19 1932 77     Resp 11/14/19 1932 18     Temp 11/14/19 1929 98 F (36.7 C)     Temp Source 11/14/19 1929 Oral     SpO2 11/14/19 1932 100 %     Weight --      Height --      Head Circumference --      Peak Flow --      Pain Score 11/14/19 1930 10     Pain Loc --      Pain Edu? --      Excl. in GC? --    No data found.  Updated Vital Signs BP (!) 154/100 (BP Location: Left Wrist)   Pulse 77   Temp 98 F (36.7 C) (Oral)   Resp 18   SpO2 100%   Visual Acuity Right Eye Distance:   Left Eye Distance:   Bilateral Distance:    Right Eye Near:   Left Eye Near:    Bilateral Near:     Physical Exam Vitals and nursing note reviewed.  Constitutional:      General: She is not in acute distress.    Appearance: Normal appearance. She is well-developed.  HENT:     Head: Normocephalic and atraumatic.  Eyes:     Conjunctiva/sclera: Conjunctivae normal.  Cardiovascular:     Rate and Rhythm: Normal rate.     Pulses: Normal pulses.  Pulmonary:     Effort: Pulmonary effort is normal. No respiratory distress.  Musculoskeletal:     Cervical back: Normal range of motion.  Skin:    General: Skin is warm and dry.     Findings: Lesion present.     Comments: Right middle finger with 1 cm laceration involving the superficial skin   Neurological:     Mental Status: She is alert.     Sensory: No sensory deficit.      UC Treatments / Results  Labs (all labs ordered are listed, but only abnormal results are displayed) Labs Reviewed - No data to display  EKG   Radiology No results found.  Procedures Laceration Repair  Date/Time: 11/14/2019 8:21 PM Performed by: Katha Cabal, DO Authorized by: Merrilee Jansky, MD   Consent:    Consent obtained:  Verbal   Consent given by:  Patient   Risks discussed:  Infection, pain and retained foreign body  Alternatives discussed:  Referral Anesthesia (see MAR for exact dosages):    Anesthesia method:  Local  infiltration   Local anesthetic:  Lidocaine 2% w/o epi Laceration details:    Location:  Finger   Finger location:  R long finger   Length (cm):  1 Repair type:    Repair type:  Simple Pre-procedure details:    Preparation:  Patient was prepped and draped in usual sterile fashion Exploration:    Hemostasis achieved with:  Direct pressure   Contaminated: no   Treatment:    Area cleansed with:  Saline Skin repair:    Repair method:  Sutures   Suture size:  5-0   Suture technique:  Simple interrupted   Number of sutures:  3 Approximation:    Approximation:  Close Post-procedure details:    Dressing:  Antibiotic ointment and tube gauze   Patient tolerance of procedure:  Tolerated well, no immediate complications   (including critical care time)  Medications Ordered in UC Medications  Tdap (BOOSTRIX) injection 0.5 mL (0.5 mLs Intramuscular Given 11/14/19 1952)    Initial Impression / Assessment and Plan / UC Course  I have reviewed the triage vital signs and the nursing notes.  Pertinent labs & imaging results that were available during my care of the patient were reviewed by me and considered in my medical decision making (see chart for details).     Right middle finger laceration repaired without complications. See procedure note above. Tetanus shot given.  Final Clinical Impressions(s) / UC Diagnoses   Final diagnoses:  Laceration of right middle finger without foreign body without damage to nail, initial encounter     Discharge Instructions     You were seen at the Urgent Care for cut on your finger. Follow up here in 5-7 days for suture removal.   If you haven't already, sign up for My Chart to have easy access to your labs results, and communication with your primary care physician.  Dr. Rachael Darby      ED Prescriptions    None     PDMP not reviewed this encounter.   Katha Cabal, DO 11/14/19 2024

## 2019-11-15 LAB — COMPREHENSIVE METABOLIC PANEL
ALT: 21 IU/L (ref 0–32)
AST: 17 IU/L (ref 0–40)
Albumin/Globulin Ratio: 1.6 (ref 1.2–2.2)
Albumin: 4 g/dL (ref 3.8–4.8)
Alkaline Phosphatase: 109 IU/L (ref 48–121)
BUN/Creatinine Ratio: 15 (ref 9–23)
BUN: 11 mg/dL (ref 6–24)
Bilirubin Total: <0.2 mg/dL (ref 0.0–1.2)
CO2: 20 mmol/L (ref 20–29)
Calcium: 9.2 mg/dL (ref 8.7–10.2)
Chloride: 106 mmol/L (ref 96–106)
Creatinine, Ser: 0.72 mg/dL (ref 0.57–1.00)
GFR calc Af Amer: 115 mL/min/{1.73_m2} (ref >59)
GFR calc non Af Amer: 99 mL/min/{1.73_m2} (ref >59)
Globulin, Total: 2.5 g/dL (ref 1.5–4.5)
Glucose: 143 mg/dL — ABNORMAL HIGH (ref 65–99)
Potassium: 4.6 mmol/L (ref 3.5–5.2)
Sodium: 139 mmol/L (ref 134–144)
Total Protein: 6.5 g/dL (ref 6.0–8.5)

## 2019-11-15 LAB — CBC WITH DIFFERENTIAL
Basophils Absolute: 0 10*3/uL (ref 0.0–0.2)
Basos: 0 %
EOS (ABSOLUTE): 0 10*3/uL (ref 0.0–0.4)
Eos: 0 %
Hematocrit: 43.8 % (ref 34.0–46.6)
Hemoglobin: 13.7 g/dL (ref 11.1–15.9)
Immature Grans (Abs): 0.1 10*3/uL (ref 0.0–0.1)
Immature Granulocytes: 1 %
Lymphocytes Absolute: 1.2 10*3/uL (ref 0.7–3.1)
Lymphs: 11 %
MCH: 29.3 pg (ref 26.6–33.0)
MCHC: 31.3 g/dL — ABNORMAL LOW (ref 31.5–35.7)
MCV: 94 fL (ref 79–97)
Monocytes Absolute: 0.3 10*3/uL (ref 0.1–0.9)
Monocytes: 3 %
Neutrophils Absolute: 9.3 10*3/uL — ABNORMAL HIGH (ref 1.4–7.0)
Neutrophils: 85 %
RBC: 4.67 x10E6/uL (ref 3.77–5.28)
RDW: 15.4 % (ref 11.7–15.4)
WBC: 10.9 10*3/uL — ABNORMAL HIGH (ref 3.4–10.8)

## 2019-11-15 LAB — LIPID PANEL
Chol/HDL Ratio: 4.5 ratio — ABNORMAL HIGH (ref 0.0–4.4)
Cholesterol, Total: 227 mg/dL — ABNORMAL HIGH (ref 100–199)
HDL: 50 mg/dL (ref >39)
LDL Chol Calc (NIH): 160 mg/dL — ABNORMAL HIGH (ref 0–99)
Triglycerides: 93 mg/dL (ref 0–149)
VLDL Cholesterol Cal: 17 mg/dL (ref 5–40)

## 2019-11-15 LAB — VITAMIN D 25 HYDROXY (VIT D DEFICIENCY, FRACTURES): Vit D, 25-Hydroxy: 15.7 ng/mL — ABNORMAL LOW (ref 30.0–100.0)

## 2019-11-15 LAB — TSH: TSH: 0.652 u[IU]/mL (ref 0.450–4.500)

## 2019-11-15 LAB — HEMOGLOBIN A1C
Est. average glucose Bld gHb Est-mCnc: 131 mg/dL
Hgb A1c MFr Bld: 6.2 % — ABNORMAL HIGH (ref 4.8–5.6)

## 2019-11-15 LAB — VITAMIN B12: Vitamin B-12: 1446 pg/mL — ABNORMAL HIGH (ref 232–1245)

## 2019-11-16 ENCOUNTER — Encounter: Payer: Self-pay | Admitting: Registered Nurse

## 2019-11-16 DIAGNOSIS — E559 Vitamin D deficiency, unspecified: Secondary | ICD-10-CM

## 2019-11-16 MED ORDER — VITAMIN D (ERGOCALCIFEROL) 1.25 MG (50000 UNIT) PO CAPS: 50000.0000 [IU] | ORAL_CAPSULE | ORAL | 1 refills | Status: DC

## 2019-11-18 ENCOUNTER — Other Ambulatory Visit: Payer: Self-pay | Admitting: Registered Nurse

## 2019-11-18 ENCOUNTER — Other Ambulatory Visit: Payer: Self-pay

## 2019-11-18 ENCOUNTER — Telehealth: Payer: Self-pay | Admitting: Registered Nurse

## 2019-11-18 DIAGNOSIS — N951 Menopausal and female climacteric states: Secondary | ICD-10-CM

## 2019-11-18 MED ORDER — ESTROGENS, CONJUGATED 0.625 MG/GM VA CREA: 1.0000 | TOPICAL_CREAM | Freq: Every day | VAGINAL | 12 refills | Status: DC

## 2019-11-18 MED ORDER — NAPROXEN 500 MG PO TABS: 500.0000 mg | ORAL_TABLET | Freq: Two times a day (BID) | ORAL | 0 refills | Status: DC

## 2019-11-18 NOTE — Telephone Encounter (Signed)
What is the name of the medication? naproxen (NAPROSYN) 500 MG tablet [[id number]]    Have you contacted your pharmacy to request a refill? Yes, she needs a refill.   Which pharmacy would you like this sent to? Pharmacy  [facility] [phone] - [place], [place] - [address] AT [facility] [address] [facility] [address] [facility] [address]  [address] [place] [address]  Phone:  [phone] Fax:  [phone]       Patient notified that their request is being sent to the clinical staff for review and that they should receive a call once it is complete. If they do not receive a call within 72 hours they can check with their pharmacy or our office.

## 2019-11-18 NOTE — Telephone Encounter (Signed)
Sent,  Thanks,  [name], NP

## 2019-11-18 NOTE — Telephone Encounter (Signed)
Pt states the script is a cream she needs to have sex with her husband because of her menopause. This is what she said.

## 2019-11-19 ENCOUNTER — Other Ambulatory Visit: Payer: Self-pay | Admitting: Registered Nurse

## 2019-11-19 ENCOUNTER — Encounter: Payer: Self-pay | Admitting: Registered Nurse

## 2019-11-19 DIAGNOSIS — N951 Menopausal and female climacteric states: Secondary | ICD-10-CM

## 2019-11-19 MED ORDER — ESTRADIOL 0.1 MG/GM VA CREA: 1.0000 | TOPICAL_CREAM | Freq: Every day | VAGINAL | 12 refills | Status: DC

## 2019-11-19 NOTE — Telephone Encounter (Signed)
Patient states you prescribed her a cream to help with sexual intercourse and her insurance does not cover it and self pay is 600.00. She would like to know if its something else that's not as expensive.  Please Advise

## 2019-12-02 ENCOUNTER — Encounter: Payer: Self-pay | Admitting: Registered Nurse

## 2019-12-02 ENCOUNTER — Other Ambulatory Visit: Payer: Self-pay

## 2019-12-02 ENCOUNTER — Ambulatory Visit: Payer: 59 | Admitting: Registered Nurse

## 2019-12-02 VITALS — BP 109/75 | HR 82 | Temp 98.0°F | Resp 18 | Ht 67.0 in | Wt 320.8 lb

## 2019-12-02 DIAGNOSIS — S99911A Unspecified injury of right ankle, initial encounter: Secondary | ICD-10-CM | POA: Diagnosis not present

## 2019-12-02 DIAGNOSIS — M25512 Pain in left shoulder: Secondary | ICD-10-CM | POA: Diagnosis not present

## 2019-12-02 MED ORDER — METHYLPREDNISOLONE ACETATE 40 MG/ML IJ SUSP
40.0000 mg | Freq: Once | INTRAMUSCULAR | Status: AC
  Administered 2019-12-02: 40 mg via INTRAMUSCULAR

## 2019-12-02 MED ORDER — DICLOFENAC SODIUM 75 MG PO TBEC: 75.0000 mg | DELAYED_RELEASE_TABLET | Freq: Two times a day (BID) | ORAL | 0 refills | Status: DC

## 2019-12-02 MED ORDER — METHOCARBAMOL 500 MG PO TABS: 500.0000 mg | ORAL_TABLET | Freq: Two times a day (BID) | ORAL | 1 refills | Status: DC

## 2019-12-02 MED ORDER — TRAMADOL HCL 50 MG PO TABS: 50.0000 mg | ORAL_TABLET | Freq: Three times a day (TID) | ORAL | 0 refills | Status: AC | PRN

## 2019-12-02 NOTE — Progress Notes (Signed)
Acute Office Visit  Subjective:    Patient ID: Diana Garrison, female    DOB: 05-05-1970, 49 y.o.   MRN: [id number]  Chief Complaint  Patient presents with   Ankle Pain    Patient states she stepped in a hole in the yard and has been in pain. she took ibuprofen but it had no relief.    HPI Patient is in today for R ankle pain Onset yesterday Stepped in a hole in her yard and ankle dropped suddenly.  No snap or pop heard Acute pain Can bear weight Tenderness around achilles No redness. Mild swelling. No change to sensation. No bruising.  Taken ibuprofen otc without relief  Past Medical History:  Diagnosis Date   Hyperlipidemia    Phreesia 11/12/2019    Past Surgical History:  Procedure Laterality Date   CESAREAN SECTION     CESAREAN SECTION N/A    Phreesia 11/12/2019   GANGLION CYST EXCISION     x2    Family History  Problem Relation Age of Onset   Cirrhosis Father    Pulmonary embolism Sister    Breast cancer Neg Hx     Social History   Socioeconomic History   Marital status: Married    Spouse name: Not on file   Number of children: 2   Years of education: Not on file   Highest education level: Not on file  Occupational History   Occupation: CNA    Comment: [employer]  Tobacco Use   Smoking status: Current Every Day Smoker    Packs/day: 0.25    Types: Cigarettes    Start date: 10/10/2007   Smokeless tobacco: Never Used  Vaping Use   Vaping Use: Never used  Substance and Sexual Activity   Alcohol use: Yes    Comment: 4 drinks a week - red wine   Drug use: Not Currently    Types: Marijuana   Sexual activity: Yes    Partners: Male    Birth control/protection: None, Condom    Comment: 10/04/14: Not sexually active x5 mo due to spouse having had a CVA  Other Topics Concern   Not on file  Social History Narrative   Lives with daughter       Seatbelt - 100%      Guns in home - no   Social Determinants of Health    Financial Resource Strain:    Difficulty of Paying Living Expenses: Not on file  Food Insecurity:    Worried About Charity fundraiser in the Last Year: Not on file   [employer] of Food in the Last Year: Not on file  Transportation Needs:    Lack of Transportation (Medical): Not on file   Lack of Transportation (Non-Medical): Not on file  Physical Activity:    Days of Exercise per Week: Not on file   Minutes of Exercise per Session: Not on file  Stress:    Feeling of Stress : Not on file  Social Connections:    Frequency of Communication with Friends and Family: Not on file   Frequency of Social Gatherings with Friends and Family: Not on file   Attends Religious Services: Not on file   Active Member of Clubs or Organizations: Not on file   Attends Archivist Meetings: Not on file   Marital Status: Not on file  Intimate Partner Violence:    Fear of Current or Ex-Partner: Not on file   Emotionally Abused: Not on file  Physically Abused: Not on file   Sexually Abused: Not on file    Outpatient Medications Prior to Visit  Medication Sig Dispense Refill   acetaminophen (TYLENOL) 325 MG tablet Take 650 mg by mouth every 6 (six) hours as needed.     cyanocobalamin 100 MCG tablet Take 100 mcg by mouth daily.     estradiol (ESTRACE) 0.1 MG/GM vaginal cream Place 1 Applicatorful vaginally at bedtime. 42.5 g 12   fluticasone (FLONASE) 50 MCG/ACT nasal spray Place 2 sprays into both nostrils daily. 16 g 6   ibuprofen (ADVIL) 800 MG tablet Take 1 tablet (800 mg total) by mouth every 8 (eight) hours as needed. 90 tablet 1   montelukast (SINGULAIR) 10 MG tablet TAKE 1 TABLET BY MOUTH EVERYDAY AT BEDTIME 30 tablet 3   naproxen (NAPROSYN) 500 MG tablet Take 1 tablet (500 mg total) by mouth 2 (two) times daily with a meal. 30 tablet 0   rosuvastatin (CRESTOR) 20 MG tablet TAKE 1 TABLET BY MOUTH EVERY DAY 90 tablet 0   valACYclovir (VALTREX) 1000 MG tablet  Take 2 pills at onset of cold sore and then repeat in 12 hours for each outbreak 30 tablet 1   Vitamin D, Ergocalciferol, (DRISDOL) 1.25 MG (50000 UNIT) CAPS capsule Take 1 capsule (50,000 Units total) by mouth every 7 (seven) days. 8 capsule 1   methocarbamol (ROBAXIN) 500 MG tablet TAKE 1 TABLET BY MOUTH TWICE A DAY 30 tablet 1   amoxicillin-clavulanate (AUGMENTIN) 875-125 MG tablet Take 1 tablet by mouth 2 (two) times daily. (Patient not taking: Reported on 12/02/2019) 14 tablet 0   buPROPion (ZYBAN) 150 MG 12 hr tablet TAKE 1 TABLET BY MOUTH EVERY DAY (Patient not taking: Reported on 11/14/2019) 180 tablet 0   predniSONE (DELTASONE) 50 MG tablet Take 1 tablet daily with breakfast for 3 days (Patient not taking: Reported on 11/14/2019) 3 tablet 0   No facility-administered medications prior to visit.    No Known Allergies  Review of Systems  Constitutional: Negative.   HENT: Negative.   Eyes: Negative.   Respiratory: Negative.   Cardiovascular: Negative.   Gastrointestinal: Negative.   Endocrine: Negative.   Genitourinary: Negative.   Musculoskeletal: Positive for arthralgias. Negative for back pain, gait problem, joint swelling, myalgias, neck pain and neck stiffness.  Skin: Negative.   Allergic/Immunologic: Negative.   Neurological: Negative.   Hematological: Negative.   Psychiatric/Behavioral: Negative.        Objective:    Physical Exam Vitals and nursing note reviewed.  Constitutional:      General: She is not in acute distress.    Appearance: Normal appearance. She is obese. She is not ill-appearing, toxic-appearing or diaphoretic.  Cardiovascular:     Rate and Rhythm: Normal rate and regular rhythm.     Pulses: Normal pulses.     Heart sounds: Normal heart sounds.  Pulmonary:     Effort: Pulmonary effort is normal. No respiratory distress.     Breath sounds: Normal breath sounds.  Musculoskeletal:        General: Swelling, tenderness and signs of injury present.  No deformity. Normal range of motion.     Right lower leg: No edema.     Left lower leg: No edema.     Comments: Thompson test shows intact achilles tendon  Skin:    General: Skin is warm and dry.     Capillary Refill: Capillary refill takes 2 to 3 seconds.     Coloration: Skin is not  jaundiced or pale.     Findings: No bruising, erythema, lesion or rash.  Neurological:     General: No focal deficit present.     Mental Status: She is alert and oriented to person, place, and time. Mental status is at baseline.  Psychiatric:        Mood and Affect: Mood normal.        Behavior: Behavior normal.        Thought Content: Thought content normal.        Judgment: Judgment normal.     BP 109/75    Pulse 82    Temp 98 F (36.7 C) (Temporal)    Resp 18    Ht _0  (1.702 m)    Wt (!) 320 lb 12.8 oz (145.5 kg)    SpO2 99%    BMI 50.24 kg/m  Wt Readings from Last 3 Encounters:  12/02/19 (!) 320 lb 12.8 oz (145.5 kg)  11/14/19 (!) 325 lb 3.2 oz (147.5 kg)  10/14/19 (!) 325 lb (147.4 kg)    There are no preventive care reminders to display for this patient.  There are no preventive care reminders to display for this patient.   Lab Results  Component Value Date   TSH 0.652 11/14/2019   Lab Results  Component Value Date   WBC 10.9 (H) 11/14/2019   HGB 13.7 11/14/2019   HCT 43.8 11/14/2019   MCV 94 11/14/2019   PLT 276 11/06/2018   Lab Results  Component Value Date   NA 139 11/14/2019   K 4.6 11/14/2019   CO2 20 11/14/2019   GLUCOSE 143 (H) 11/14/2019   BUN 11 11/14/2019   CREATININE 0.72 11/14/2019   BILITOT <0.2 11/14/2019   ALKPHOS 109 11/14/2019   AST 17 11/14/2019   ALT 21 11/14/2019   PROT 6.5 11/14/2019   ALBUMIN 4.0 11/14/2019   CALCIUM 9.2 11/14/2019   Lab Results  Component Value Date   CHOL 227 (H) 11/14/2019   Lab Results  Component Value Date   HDL 50 11/14/2019   Lab Results  Component Value Date   LDLCALC 160 (H) 11/14/2019   Lab Results   Component Value Date   TRIG 93 11/14/2019   Lab Results  Component Value Date   CHOLHDL 4.5 (H) 11/14/2019   Lab Results  Component Value Date   HGBA1C 6.2 (H) 11/14/2019       Assessment & Plan:   Problem List Items Addressed This Visit    None    Visit Diagnoses    Injury of right ankle, initial encounter    -  Primary   Relevant Medications   methylPREDNISolone acetate (DEPO-MEDROL) injection 40 mg (Completed)   diclofenac (VOLTAREN) 75 MG EC tablet   methocarbamol (ROBAXIN) 500 MG tablet   traMADol (ULTRAM) 50 MG tablet   Pain in joint of left shoulder           Meds ordered this encounter  Medications   methylPREDNISolone acetate (DEPO-MEDROL) injection 40 mg   diclofenac (VOLTAREN) 75 MG EC tablet    Sig: Take 1 tablet (75 mg total) by mouth 2 (two) times daily.    Dispense:  30 tablet    Refill:  0    Order Specific Question:   Supervising Provider    Answer:   Carlota Raspberry, JEFFREY R [[id number]]   methocarbamol (ROBAXIN) 500 MG tablet    Sig: Take 1 tablet (500 mg total) by mouth 2 (two) times daily.  Dispense:  30 tablet    Refill:  1    DX Code Needed  .    Order Specific Question:   Supervising Provider    Answer:   Carlota Raspberry, JEFFREY R [[id number]]   traMADol (ULTRAM) 50 MG tablet    Sig: Take 1 tablet (50 mg total) by mouth every 8 (eight) hours as needed for up to 5 days.    Dispense:  15 tablet    Refill:  0    Order Specific Question:   Supervising Provider    Answer:   Carlota Raspberry, JEFFREY R [[id number]]   PLAN  Will give depo medrol injection, diclofenac po bid prn, methocarbamol po bid prn  Tramadol for breakthrough pain  Mild stretching, stay home and rest next 1-2 days  If worsening or failing to improve contact clinic  [place] criteria not met for xray  Patient encouraged to call clinic with any questions, comments, or concerns.   Maximiano Coss, NP

## 2019-12-02 NOTE — Patient Instructions (Signed)
° ° ° °  If you have lab work done today you will be contacted with your lab results within the next 2 weeks.  If you have not heard from us then please contact us. The fastest way to get your results is to register for My Chart. ° ° °IF you received an x-ray today, you will receive an invoice from [facility]. Please contact [facility] at [phone] with questions or concerns regarding your invoice.  ° °IF you received labwork today, you will receive an invoice from [facility]. Please contact [facility] at [phone] with questions or concerns regarding your invoice.  ° °Our billing staff will not be able to assist you with questions regarding bills from these companies. ° °You will be contacted with the lab results as soon as they are available. The fastest way to get your results is to activate your My Chart account. Instructions are located on the last page of this paperwork. If you have not heard from us regarding the results in 2 weeks, please contact this office. °  ° ° ° °

## 2020-01-10 ENCOUNTER — Other Ambulatory Visit: Payer: Self-pay | Admitting: Registered Nurse

## 2020-01-10 DIAGNOSIS — J302 Other seasonal allergic rhinitis: Secondary | ICD-10-CM

## 2020-01-10 NOTE — Telephone Encounter (Signed)
Requested Prescriptions  Pending Prescriptions Disp Refills  . montelukast (SINGULAIR) 10 MG tablet [Pharmacy Med Name: MONTELUKAST SOD 10 MG TABLET] 90 tablet 1    Sig: TAKE 1 TABLET BY MOUTH EVERYDAY AT BEDTIME     Pulmonology:  Leukotriene Inhibitors Passed - 01/10/2020  8:40 AM      Passed - Valid encounter within last 12 months    Recent Outpatient Visits          1 month ago Injury of right ankle, initial encounter   [facility], Richard, NP   1 month ago Elevated vitamin B12 level   [facility], Richard, NP   2 months ago Pain in left upper arm   [facility], Gerlene Burdock, NP   6 months ago Acute frontal sinusitis, recurrence not specified   [facility], Gerlene Burdock, NP   8 months ago Vaginal itching   [facility], Gerlene Burdock, NP

## 2020-01-26 ENCOUNTER — Other Ambulatory Visit: Payer: Self-pay | Admitting: Registered Nurse

## 2020-01-26 DIAGNOSIS — S99911A Unspecified injury of right ankle, initial encounter: Secondary | ICD-10-CM

## 2020-01-26 DIAGNOSIS — E7800 Pure hypercholesterolemia, unspecified: Secondary | ICD-10-CM

## 2020-01-26 NOTE — Telephone Encounter (Signed)
Requested Prescriptions  Pending Prescriptions Disp Refills   rosuvastatin (CRESTOR) 20 MG tablet [Pharmacy Med Name: ROSUVASTATIN CALCIUM 20 MG TAB] 90 tablet 3    Sig: TAKE 1 TABLET BY MOUTH EVERY DAY     Cardiovascular:  Antilipid - Statins Failed - 01/26/2020  1:39 PM      Failed - Total Cholesterol in normal range and within 360 days    Cholesterol, Total  Date Value Ref Range Status  11/14/2019 227 (H) 100 - 199 mg/dL Final         Failed - LDL in normal range and within 360 days    LDL Chol Calc (NIH)  Date Value Ref Range Status  11/14/2019 160 (H) 0 - 99 mg/dL Final         Passed - HDL in normal range and within 360 days    HDL  Date Value Ref Range Status  11/14/2019 50 >39 mg/dL Final         Passed - Triglycerides in normal range and within 360 days    Triglycerides  Date Value Ref Range Status  11/14/2019 93 0 - 149 mg/dL Final         Passed - Patient is not pregnant      Passed - Valid encounter within last 12 months    Recent Outpatient Visits          1 month ago Injury of right ankle, initial encounter   [facility], Richard, NP   2 months ago Elevated vitamin B12 level   [facility], Richard, NP   3 months ago Pain in left upper arm   [facility], Richard, NP   6 months ago Acute frontal sinusitis, recurrence not specified   [facility], Gerlene Burdock, NP   9 months ago Vaginal itching   [facility], Richard, NP              naproxen (NAPROSYN) 500 MG tablet [Pharmacy Med Name: NAPROXEN 500 MG TABLET] 30 tablet 0    Sig: TAKE 1 TABLET (500 MG TOTAL) BY MOUTH 2 (TWO) TIMES DAILY WITH A MEAL.     Analgesics:  NSAIDS Passed - 01/26/2020  1:39 PM      Passed - Cr in normal range and within 360 days    Creatinine, Ser  Date Value Ref Range Status  11/14/2019 0.72 0.57 - 1.00 mg/dL Final         Passed - HGB in normal range and within 360 days     Hemoglobin  Date Value Ref Range Status  11/14/2019 13.7 11.1 - 15.9 g/dL Final         Passed - Patient is not pregnant      Passed - Valid encounter within last 12 months    Recent Outpatient Visits          1 month ago Injury of right ankle, initial encounter   [facility], Richard, NP   2 months ago Elevated vitamin B12 level   [facility], Richard, NP   3 months ago Pain in left upper arm   [facility], Richard, NP   6 months ago Acute frontal sinusitis, recurrence not specified   [facility], Gerlene Burdock, NP   9 months ago Vaginal itching   [facility], Gerlene Burdock, NP  methocarbamol (ROBAXIN) 500 MG tablet [Pharmacy Med Name: METHOCARBAMOL 500 MG TABLET] 30 tablet 1    Sig: TAKE 1 TABLET BY MOUTH TWICE A DAY     Not Delegated - Analgesics:  Muscle Relaxants Failed - 01/26/2020  1:39 PM      Failed - This refill cannot be delegated      Passed - Valid encounter within last 6 months    Recent Outpatient Visits          1 month ago Injury of right ankle, initial encounter   [facility], Gerlene Burdock, NP   2 months ago Elevated vitamin B12 level   [facility], Gerlene Burdock, NP   3 months ago Pain in left upper arm   [facility], Gerlene Burdock, NP   6 months ago Acute frontal sinusitis, recurrence not specified   [facility], Gerlene Burdock, NP   9 months ago Vaginal itching   [facility], Gerlene Burdock, NP              diclofenac (VOLTAREN) 75 MG EC tablet [Pharmacy Med Name: DICLOFENAC SOD EC 75 MG TAB] 30 tablet 0    Sig: TAKE 1 TABLET BY MOUTH TWICE A DAY     Analgesics:  NSAIDS Passed - 01/26/2020  1:39 PM      Passed - Cr in normal range and within 360 days    Creatinine, Ser  Date Value Ref Range Status  11/14/2019 0.72 0.57 - 1.00 mg/dL Final         Passed - HGB in normal range and within  360 days    Hemoglobin  Date Value Ref Range Status  11/14/2019 13.7 11.1 - 15.9 g/dL Final         Passed - Patient is not pregnant      Passed - Valid encounter within last 12 months    Recent Outpatient Visits          1 month ago Injury of right ankle, initial encounter   [facility], Richard, NP   2 months ago Elevated vitamin B12 level   [facility], Richard, NP   3 months ago Pain in left upper arm   [facility], Richard, NP   6 months ago Acute frontal sinusitis, recurrence not specified   [facility], Gerlene Burdock, NP   9 months ago Vaginal itching   [facility], Gerlene Burdock, NP             OV/ CPE 11/14/19

## 2020-01-26 NOTE — Telephone Encounter (Signed)
Requested medication (s) are due for refill today: {yes  Requested medication (s) are on the active medication list: yes  Last refill: methocarbanol not delegated. Naproxin 11/18/19 #30 0 refills, Diclofenac , all ordered for acute injury  Future visit scheduled: No  Notes to clinic:  Methocarbinol not delegated. Other two medications ordered for acute injury. Please review  last OV 11/14/19.    Requested Prescriptions  Pending Prescriptions Disp Refills   naproxen (NAPROSYN) 500 MG tablet [Pharmacy Med Name: NAPROXEN 500 MG TABLET] 30 tablet 0    Sig: TAKE 1 TABLET (500 MG TOTAL) BY MOUTH 2 (TWO) TIMES DAILY WITH A MEAL.      Analgesics:  NSAIDS Passed - 01/26/2020  1:39 PM      Passed - Cr in normal range and within 360 days    Creatinine, Ser  Date Value Ref Range Status  11/14/2019 0.72 0.57 - 1.00 mg/dL Final          Passed - HGB in normal range and within 360 days    Hemoglobin  Date Value Ref Range Status  11/14/2019 13.7 11.1 - 15.9 g/dL Final          Passed - Patient is not pregnant      Passed - Valid encounter within last 12 months    Recent Outpatient Visits           1 month ago Injury of right ankle, initial encounter   [facility], Richard, NP   2 months ago Elevated vitamin B12 level   [facility], Richard, NP   3 months ago Pain in left upper arm   [facility], Richard, NP   6 months ago Acute frontal sinusitis, recurrence not specified   [facility], Gerlene Burdock, NP   9 months ago Vaginal itching   [facility], Richard, NP                methocarbamol (ROBAXIN) 500 MG tablet [Pharmacy Med Name: METHOCARBAMOL 500 MG TABLET] 30 tablet 1    Sig: TAKE 1 TABLET BY MOUTH TWICE A DAY      Not Delegated - Analgesics:  Muscle Relaxants Failed - 01/26/2020  1:39 PM      Failed - This refill cannot be delegated      Passed - Valid encounter within last 6 months     Recent Outpatient Visits           1 month ago Injury of right ankle, initial encounter   [facility], Richard, NP   2 months ago Elevated vitamin B12 level   [facility], Richard, NP   3 months ago Pain in left upper arm   [facility], Richard, NP   6 months ago Acute frontal sinusitis, recurrence not specified   [facility], Gerlene Burdock, NP   9 months ago Vaginal itching   [facility], Richard, NP                diclofenac (VOLTAREN) 75 MG EC tablet [Pharmacy Med Name: DICLOFENAC SOD EC 75 MG TAB] 30 tablet 0    Sig: TAKE 1 TABLET BY MOUTH TWICE A DAY      Analgesics:  NSAIDS Passed - 01/26/2020  1:39 PM      Passed - Cr in normal range and within 360 days  Creatinine, Ser  Date Value Ref Range Status  11/14/2019 0.72 0.57 - 1.00 mg/dL Final          Passed - HGB in normal range and within 360 days    Hemoglobin  Date Value Ref Range Status  11/14/2019 13.7 11.1 - 15.9 g/dL Final          Passed - Patient is not pregnant      Passed - Valid encounter within last 12 months    Recent Outpatient Visits           1 month ago Injury of right ankle, initial encounter   [facility], Gerlene Burdock, NP   2 months ago Elevated vitamin B12 level   [facility], Gerlene Burdock, NP   3 months ago Pain in left upper arm   [facility], Gerlene Burdock, NP   6 months ago Acute frontal sinusitis, recurrence not specified   [facility], Gerlene Burdock, NP   9 months ago Vaginal itching   [facility], Gerlene Burdock, NP               Signed Prescriptions Disp Refills   rosuvastatin (CRESTOR) 20 MG tablet 90 tablet 3    Sig: TAKE 1 TABLET BY MOUTH EVERY DAY      Cardiovascular:  Antilipid - Statins Failed - 01/26/2020  1:39 PM      Failed - Total Cholesterol in normal range and within 360 days    Cholesterol, Total  Date  Value Ref Range Status  11/14/2019 227 (H) 100 - 199 mg/dL Final          Failed - LDL in normal range and within 360 days    LDL Chol Calc (NIH)  Date Value Ref Range Status  11/14/2019 160 (H) 0 - 99 mg/dL Final          Passed - HDL in normal range and within 360 days    HDL  Date Value Ref Range Status  11/14/2019 50 >39 mg/dL Final          Passed - Triglycerides in normal range and within 360 days    Triglycerides  Date Value Ref Range Status  11/14/2019 93 0 - 149 mg/dL Final          Passed - Patient is not pregnant      Passed - Valid encounter within last 12 months    Recent Outpatient Visits           1 month ago Injury of right ankle, initial encounter   [facility], Richard, NP   2 months ago Elevated vitamin B12 level   [facility], Richard, NP   3 months ago Pain in left upper arm   [facility], Richard, NP   6 months ago Acute frontal sinusitis, recurrence not specified   [facility], Gerlene Burdock, NP   9 months ago Vaginal itching   [facility], Gerlene Burdock, NP

## 2020-01-27 MED ORDER — DICLOFENAC SODIUM 75 MG PO TBEC: 75.0000 mg | DELAYED_RELEASE_TABLET | Freq: Two times a day (BID) | ORAL | 0 refills | Status: DC

## 2020-01-27 MED ORDER — NAPROXEN 500 MG PO TABS
500.0000 mg | ORAL_TABLET | Freq: Two times a day (BID) | ORAL | 0 refills | Status: DC
Start: 2020-01-27 — End: 2020-07-27

## 2020-01-30 ENCOUNTER — Encounter: Payer: 59 | Admitting: Obstetrics and Gynecology

## 2020-02-03 ENCOUNTER — Other Ambulatory Visit: Payer: Self-pay | Admitting: Registered Nurse

## 2020-02-03 DIAGNOSIS — E559 Vitamin D deficiency, unspecified: Secondary | ICD-10-CM

## 2020-02-03 NOTE — Telephone Encounter (Signed)
Requested medication (s) are due for refill today:Due 03/18/19  Requested medication (s) are on the active medication list: yes   Last refill:11/16/19  #8  1 refill  Future visit scheduled  no  Notes to clinic: not delegated  Requested Prescriptions  Pending Prescriptions Disp Refills   Vitamin D, Ergocalciferol, (DRISDOL) 1.25 MG (50000 UNIT) CAPS capsule [Pharmacy Med Name: VITAMIN D2 1.25MG (50,000 UNIT)] 12 capsule 1    Sig: Take 1 capsule (50,000 Units total) by mouth every 7 (seven) days.      Endocrinology:  Vitamins - Vitamin D Supplementation Failed - 02/03/2020 12:06 AM      Failed - 50,000 IU strengths are not delegated      Failed - Phosphate in normal range and within 360 days    No results found for: PHOS        Failed - Vitamin D in normal range and within 360 days    Vit D, 25-Hydroxy  Date Value Ref Range Status  11/14/2019 15.7 (L) 30.0 - 100.0 ng/mL Final    Comment:    Vitamin D deficiency has been defined by the Institute of Medicine and an Endocrine Society practice guideline as a level of serum 25-OH vitamin D less than 20 ng/mL (1,2). The Endocrine Society went on to further define vitamin D insufficiency as a level between 21 and 29 ng/mL (2). 1. IOM (Institute of Medicine). 2010. Dietary reference    intakes for calcium and D. [place]: The    [employer]. 2. Holick MF, Binkley Vansant, Bischoff-Ferrari HA, et al.    Evaluation, treatment, and prevention of vitamin D    deficiency: an Endocrine Society clinical practice    guideline. JCEM. 2011 Jul; 96(7):1911-30.           Passed - Ca in normal range and within 360 days    Calcium  Date Value Ref Range Status  11/14/2019 9.2 8.7 - 10.2 mg/dL Final          Passed - Valid encounter within last 12 months    Recent Outpatient Visits           2 months ago Injury of right ankle, initial encounter   [facility], Richard, NP   2 months ago Elevated vitamin B12  level   [facility], Richard, NP   3 months ago Pain in left upper arm   [facility], Gerlene Burdock, NP   6 months ago Acute frontal sinusitis, recurrence not specified   [facility], Gerlene Burdock, NP   9 months ago Vaginal itching   [facility], Gerlene Burdock, NP

## 2020-04-14 ENCOUNTER — Encounter: Payer: Self-pay | Admitting: Registered Nurse

## 2020-07-23 ENCOUNTER — Telehealth (INDEPENDENT_AMBULATORY_CARE_PROVIDER_SITE_OTHER): Payer: 59 | Admitting: Registered Nurse

## 2020-07-23 ENCOUNTER — Other Ambulatory Visit: Payer: Self-pay

## 2020-07-23 ENCOUNTER — Encounter: Payer: Self-pay | Admitting: Registered Nurse

## 2020-07-23 DIAGNOSIS — Z1211 Encounter for screening for malignant neoplasm of colon: Secondary | ICD-10-CM | POA: Diagnosis not present

## 2020-07-23 DIAGNOSIS — A09 Infectious gastroenteritis and colitis, unspecified: Secondary | ICD-10-CM

## 2020-07-23 MED ORDER — DIPHENOXYLATE-ATROPINE 2.5-0.025 MG PO TABS: 1.0000 | ORAL_TABLET | Freq: Four times a day (QID) | ORAL | 0 refills | Status: DC | PRN

## 2020-07-23 NOTE — Progress Notes (Signed)
Telemedicine Encounter- SOAP NOTE Established Patient  This telephone encounter was conducted with the patient's (or proxy's) verbal consent via audio telecommunications: yes  Patient was instructed to have this encounter in a suitably private space; and to only have persons present to whom they give permission to participate. In addition, patient identity was confirmed by use of name plus two identifiers (DOB and address).  I discussed the limitations, risks, security and privacy concerns of performing an evaluation and management service by telephone and the availability of in person appointments. I also discussed with the patient that there may be a patient responsible charge related to this service. The patient expressed understanding and agreed to proceed.  I spent a total of 15 minutes talking with the patient or their proxy.  Patient at home Provider in office  Participants: [name], NP and [name].  Chief Complaint  Patient presents with  . Diarrhea    Patient states she has been having some diarrhea for about 6 days since she came back from [place] Patient states she has been taking otc medication to try to stop diarrhea but nothing helps.    Subjective   Diana Garrison is a 50 y.o. established patient. Telephone visit today for diarrhea  HPI Onset late Sunday Traveled to [place], reports she accidentally ingested tap water when brushing her teeth. Thin, watery stool. Some abd discomfort. No pain, fevers, chills, sweats, blood in stool, or mucus in stool Has not happened before Taken Imodium and Pepto without relief.  Patient Active Problem List   Diagnosis Date Noted  . Supraspinatus tendinitis, left 03/25/2019  . Plantar fasciitis 03/25/2019  . Achilles tendinitis 01/28/2019  . Spider veins of both lower extremities 10/17/2016  . Back pain 09/04/2016  . Menorrhagia 06/06/2013  . Morbid obesity with BMI of 40.0-44.9, adult (HCC) 06/06/2013  .  Smoker 06/06/2013    Past Medical History:  Diagnosis Date  . Hyperlipidemia    Phreesia 11/12/2019    Current Outpatient Medications  Medication Sig Dispense Refill  . diclofenac (VOLTAREN) 75 MG EC tablet Take 1 tablet (75 mg total) by mouth 2 (two) times daily. 30 tablet 0  . diclofenac (VOLTAREN) 75 MG EC tablet TAKE 1 TABLET BY MOUTH TWICE A DAY 30 tablet 0  . diphenoxylate-atropine (LOMOTIL) 2.5-0.025 MG tablet Take 1 tablet by mouth 4 (four) times daily as needed for diarrhea or loose stools. 30 tablet 0  . estradiol (ESTRACE) 0.1 MG/GM vaginal cream Place 1 Applicatorful vaginally at bedtime. 42.5 g 12  . fluticasone (FLONASE) 50 MCG/ACT nasal spray Place 2 sprays into both nostrils daily. 16 g 6  . ibuprofen (ADVIL) 800 MG tablet Take 1 tablet (800 mg total) by mouth every 8 (eight) hours as needed. 90 tablet 1  . methocarbamol (ROBAXIN) 500 MG tablet TAKE 1 TABLET BY MOUTH TWICE A DAY 30 tablet 1  . naproxen (NAPROSYN) 500 MG tablet TAKE 1 TABLET (500 MG TOTAL) BY MOUTH 2 (TWO) TIMES DAILY WITH A MEAL. 30 tablet 0  . rosuvastatin (CRESTOR) 20 MG tablet TAKE 1 TABLET BY MOUTH EVERY DAY 90 tablet 3  . valACYclovir (VALTREX) 1000 MG tablet Take 2 pills at onset of cold sore and then repeat in 12 hours for each outbreak 30 tablet 1  . acetaminophen (TYLENOL) 325 MG tablet Take 650 mg by mouth every 6 (six) hours as needed. (Patient not taking: Reported on 07/23/2020)    . amoxicillin-clavulanate (AUGMENTIN) 875-125 MG tablet Take 1 tablet by  mouth 2 (two) times daily. (Patient not taking: Reported on 12/02/2019) 14 tablet 0  . buPROPion (ZYBAN) 150 MG 12 hr tablet TAKE 1 TABLET BY MOUTH EVERY DAY (Patient not taking: Reported on 11/14/2019) 180 tablet 0  . cyanocobalamin 100 MCG tablet Take 100 mcg by mouth daily. (Patient not taking: Reported on 07/23/2020)    . montelukast (SINGULAIR) 10 MG tablet TAKE 1 TABLET BY MOUTH EVERYDAY AT BEDTIME (Patient not taking: Reported on 07/23/2020) 90  tablet 1  . naproxen (NAPROSYN) 500 MG tablet Take 1 tablet (500 mg total) by mouth 2 (two) times daily with a meal. 30 tablet 0  . predniSONE (DELTASONE) 50 MG tablet Take 1 tablet daily with breakfast for 3 days (Patient not taking: No sig reported) 3 tablet 0  . Vitamin D, Ergocalciferol, (DRISDOL) 1.25 MG (50000 UNIT) CAPS capsule TAKE 1 CAPSULE (50,000 UNITS TOTAL) BY MOUTH EVERY 7 (SEVEN) DAYS. (Patient not taking: Reported on 07/23/2020) 12 capsule 1   No current facility-administered medications for this visit.    No Known Allergies  Social History   Socioeconomic History  . Marital status: Married    Spouse name: Not on file  . Number of children: 2  . Years of education: Not on file  . Highest education level: Not on file  Occupational History  . Occupation: CNA    Comment: [employer]  Tobacco Use  . Smoking status: Current Every Day Smoker    Packs/day: 0.25    Types: Cigarettes    Start date: 10/10/2007  . Smokeless tobacco: Never Used  Vaping Use  . Vaping Use: Never used  Substance and Sexual Activity  . Alcohol use: Yes    Comment: 4 drinks a week - red wine  . Drug use: Not Currently    Types: Marijuana  . Sexual activity: Yes    Partners: Male    Birth control/protection: None, Condom    Comment: 10/04/14: Not sexually active x5 mo due to spouse having had a CVA  Other Topics Concern  . Not on file  Social History Narrative   Lives with daughter       Seatbelt - 100%      Guns in home - no   Social Determinants of Health   Financial Resource Strain: Not on file  Food Insecurity: Not on file  Transportation Needs: Not on file  Physical Activity: Not on file  Stress: Not on file  Social Connections: Not on file  Intimate Partner Violence: Not on file    Review of Systems  Constitutional: Negative.   HENT: Negative.   Eyes: Negative.   Respiratory: Negative.   Cardiovascular: Negative.   Gastrointestinal: Positive for diarrhea. Negative  for abdominal pain, blood in stool, constipation, heartburn, melena, nausea and vomiting.  Genitourinary: Negative.   Musculoskeletal: Negative.   Skin: Negative.   Neurological: Negative.   Endo/Heme/Allergies: Negative.   Psychiatric/Behavioral: Negative.     Objective   Vitals as reported by the patient: There were no vitals filed for this visit.  Darcell was seen today for diarrhea.  Diagnoses and all orders for this visit:  Traveler's diarrhea -     Ambulatory referral to Gastroenterology -     diphenoxylate-atropine (LOMOTIL) 2.5-0.025 MG tablet; Take 1 tablet by mouth 4 (four) times daily as needed for diarrhea or loose stools.  Special screening for malignant neoplasm of colon -     Ambulatory referral to Gastroenterology    PLAN  Traveler's diarrhea without any  red flags. Will give lomotil. Discussed safe use  Expect resolution within next 3-4 days. If not, can consider azithromycin.  Refer to GI as back up for diarrhea and for routine screening for colon cancer  Patient encouraged to call clinic with any questions, comments, or concerns.  I discussed the assessment and treatment plan with the patient. The patient was provided an opportunity to ask questions and all were answered. The patient agreed with the plan and demonstrated an understanding of the instructions.   The patient was advised to call back or seek an in-person evaluation if the symptoms worsen or if the condition fails to improve as anticipated.  I provided 15  minutes of non-face-to-face time during this encounter.  Janeece Agee, NP  Primary Care at [facility]

## 2020-07-23 NOTE — Patient Instructions (Signed)
° ° ° °  If you have lab work done today you will be contacted with your lab results within the next 2 weeks.  If you have not heard from us then please contact us. The fastest way to get your results is to register for My Chart. ° ° °IF you received an x-ray today, you will receive an invoice from [facility]. Please contact [facility] at [phone] with questions or concerns regarding your invoice.  ° °IF you received labwork today, you will receive an invoice from [facility]. Please contact [facility] at [phone] with questions or concerns regarding your invoice.  ° °Our billing staff will not be able to assist you with questions regarding bills from these companies. ° °You will be contacted with the lab results as soon as they are available. The fastest way to get your results is to activate your My Chart account. Instructions are located on the last page of this paperwork. If you have not heard from us regarding the results in 2 weeks, please contact this office. °  ° ° ° °

## 2020-07-26 ENCOUNTER — Encounter (HOSPITAL_COMMUNITY): Payer: Self-pay | Admitting: Emergency Medicine

## 2020-07-26 ENCOUNTER — Ambulatory Visit (HOSPITAL_COMMUNITY)
Admission: EM | Admit: 2020-07-26 | Discharge: 2020-07-26 | Disposition: A | Discharge status: Home / Self Care | Payer: 59 | Attending: Internal Medicine | Admitting: Internal Medicine

## 2020-07-26 ENCOUNTER — Telehealth: Payer: Self-pay | Admitting: Registered Nurse

## 2020-07-26 ENCOUNTER — Other Ambulatory Visit: Payer: Self-pay

## 2020-07-26 DIAGNOSIS — R197 Diarrhea, unspecified: Secondary | ICD-10-CM | POA: Diagnosis not present

## 2020-07-26 MED ORDER — CIPROFLOXACIN HCL 500 MG PO TABS: 500.0000 mg | ORAL_TABLET | Freq: Two times a day (BID) | ORAL | 0 refills | Status: AC

## 2020-07-26 NOTE — Discharge Instructions (Signed)
Take antibiotic as prescribed.  You can continue Lomotil as needed.  You need to really push Pedialyte as discussed.  I want you to follow-up with your primary in the next week.  You will need to go to urgency department for persistent or worsening symptoms.

## 2020-07-26 NOTE — ED Triage Notes (Signed)
Patient went to [place] from 07-14-20 to 07-18-20 and has had diarrhea since.  Patient is not having any vomiting.  Patient is unable to eat or drink anything.  Patient is feeling dehydrated.  Abdominal cramping.  Patient has been taking Immodium, Lomotol, Pepto Bismol.  Eating crackers and ginger ale, unable to keep anything down.

## 2020-07-26 NOTE — Telephone Encounter (Signed)
Patient is still having diarrhea after having returned from [place] - Lomodium did not help, please advise

## 2020-07-26 NOTE — Telephone Encounter (Signed)
Thank you for the heads up  Rich

## 2020-07-26 NOTE — Telephone Encounter (Signed)
If any blood or mucus she should proceed to ED. Now we are one week out, should start to resolve in next day or two if it hasn't improved already. If not, should be seen in office for stool culture.  Thank you  Rich

## 2020-07-26 NOTE — Telephone Encounter (Signed)
Called patient to inform of pcp recommendations. Patient states she was in pain and felt bad. Patient is at express care now for evaluation.

## 2020-07-26 NOTE — ED Provider Notes (Signed)
[facility]    CSN: [id number] Arrival date & time: 07/26/20  1136      History   Chief Complaint Chief Complaint  Patient presents with  . Diarrhea    HPI Diana Garrison is a 50 y.o. female otherwise healthy presents to urgent care today with complaints of watery diarrhea.  Patient states symptoms began on airplane ride home from [place] on 5/8.  Was having up to 15 watery stools a day when taking Imodium and Pepto.  Episodes have decreased to approximately 8 times per day with Lomotil prescribed by PCP.  Patient able to tolerate p.o.'s however feels like it "goes right through" her.  She denies any recent fever, dizziness or lightheadedness, chest pain or shortness of breath, no melena or hematochezia.  Describes cramping abdominal discomfort.  Past Medical History:  Diagnosis Date  . Hyperlipidemia    [employer]reesia 11/12/2019    Patient Active Problem List   Diagnosis Date Noted  . Supraspinatus tendinitis, left 03/25/2019  . Plantar fasciitis 03/25/2019  . Achilles tendinitis 01/28/2019  . Spider veins of both lower extremities 10/17/2016  . Back pain 09/04/2016  . Menorrhagia 06/06/2013  . Morbid obesity with BMI of 40.0-44.9, adult (HCC) 06/06/2013  . Smoker 06/06/2013    Past Surgical History:  Procedure Laterality Date  . CESAREAN SECTION    . CESAREAN SECTION N/A    [facility]reesia 11/12/2019  . GANGLION CYST EXCISION     x2    OB History    Gravida  3   Para  3   Term  2   Preterm  1   AB  0   Living  2     SAB  0   IAB  0   Ectopic  0   Multiple  0   Live Births  1            Home Medications    Prior to Admission medications   Medication Sig Start Date End Date Taking? Authorizing Provider  ciprofloxacin (CIPRO) 500 MG tablet Take 1 tablet (500 mg total) by mouth 2 (two) times daily for 7 days. 07/26/20 08/02/20 Yes Rolla Etienne, NP  diphenoxylate-atropine (LOMOTIL) 2.5-0.025 MG tablet Take 1 tablet by mouth 4 (four) times  daily as needed for diarrhea or loose stools. 07/23/20  Yes Janeece Agee, NP  acetaminophen (TYLENOL) 325 MG tablet Take 650 mg by mouth every 6 (six) hours as needed. Patient not taking: Reported on 07/23/2020    [provider]  amoxicillin-clavulanate (AUGMENTIN) 875-125 MG tablet Take 1 tablet by mouth 2 (two) times daily. Patient not taking: Reported on 12/02/2019 07/14/19   Janeece Agee, NP  buPROPion (ZYBAN) 150 MG 12 hr tablet TAKE 1 TABLET BY MOUTH EVERY DAY Patient not taking: Reported on 11/14/2019 08/04/19   Janeece Agee, NP  cyanocobalamin 100 MCG tablet Take 100 mcg by mouth daily. Patient not taking: Reported on 07/23/2020    [provider]  diclofenac (VOLTAREN) 75 MG EC tablet Take 1 tablet (75 mg total) by mouth 2 (two) times daily. 01/27/20   Janeece Agee, NP  diclofenac (VOLTAREN) 75 MG EC tablet TAKE 1 TABLET BY MOUTH TWICE A DAY 01/27/20   Janeece Agee, NP  estradiol (ESTRACE) 0.1 MG/GM vaginal cream Place 1 Applicatorful vaginally at bedtime. 11/19/19   Janeece Agee, NP  fluticasone (FLONASE) 50 MCG/ACT nasal spray Place 2 sprays into both nostrils daily. 07/14/19   Janeece Agee, NP  ibuprofen (ADVIL) 800 MG tablet Take  1 tablet (800 mg total) by mouth every 8 (eight) hours as needed. 08/26/19   Janeece Agee, NP  methocarbamol (ROBAXIN) 500 MG tablet TAKE 1 TABLET BY MOUTH TWICE A DAY 01/27/20   Janeece Agee, NP  montelukast (SINGULAIR) 10 MG tablet TAKE 1 TABLET BY MOUTH EVERYDAY AT BEDTIME Patient not taking: Reported on 07/23/2020 01/10/20   Janeece Agee, NP  naproxen (NAPROSYN) 500 MG tablet Take 1 tablet (500 mg total) by mouth 2 (two) times daily with a meal. 01/27/20   Janeece Agee, NP  naproxen (NAPROSYN) 500 MG tablet TAKE 1 TABLET (500 MG TOTAL) BY MOUTH 2 (TWO) TIMES DAILY WITH A MEAL. 01/27/20   Janeece Agee, NP  predniSONE (DELTASONE) 50 MG tablet Take 1 tablet daily with breakfast for 3 days Patient not taking: No sig  reported 11/11/19   Particia Nearing, PA-C  rosuvastatin (CRESTOR) 20 MG tablet TAKE 1 TABLET BY MOUTH EVERY DAY 01/26/20   Janeece Agee, NP  valACYclovir (VALTREX) 1000 MG tablet Take 2 pills at onset of cold sore and then repeat in 12 hours for each outbreak 10/13/17   Weber, Dema Severin, PA-C  Vitamin D, Ergocalciferol, (DRISDOL) 1.25 MG (50000 UNIT) CAPS capsule TAKE 1 CAPSULE (50,000 UNITS TOTAL) BY MOUTH EVERY 7 (SEVEN) DAYS. Patient not taking: Reported on 07/23/2020 02/03/20   Janeece Agee, NP    Family History Family History  Problem Relation Age of Onset  . Cirrhosis Father   . Pulmonary embolism Sister   . Breast cancer Neg Hx     Social History Social History   Tobacco Use  . Smoking status: Current Every Day Smoker    Packs/day: 0.25    Types: Cigarettes    Start date: 10/10/2007  . Smokeless tobacco: Never Used  Vaping Use  . Vaping Use: Never used  Substance Use Topics  . Alcohol use: Yes    Comment: 4 drinks a week - red wine  . Drug use: Not Currently    Types: Marijuana     Allergies   Patient has no known allergies.   Review of Systems As stated in hpi Otherwise negative   Physical Exam Triage Vital Signs ED Triage Vitals  Enc Vitals Group     BP 07/26/20 1344 125/80     Pulse Rate 07/26/20 1344 83     Resp --      Temp 07/26/20 1344 98.3 F (36.8 C)     Temp Source 07/26/20 1344 Oral     SpO2 07/26/20 1344 100 %     Weight --      Height --      Head Circumference --      Peak Flow --      Pain Score 07/26/20 1346 10     Pain Loc --      Pain Edu? --      Excl. in GC? --    No data found.  Updated Vital Signs BP 125/80 (BP Location: Right Arm)   Pulse 83   Temp 98.3 F (36.8 C) (Oral)   SpO2 100%   Visual Acuity Right Eye Distance:   Left Eye Distance:   Bilateral Distance:    Right Eye Near:   Left Eye Near:    Bilateral Near:     Physical Exam Constitutional:      General: She is not in acute distress.     Appearance: She is obese. She is not ill-appearing or toxic-appearing.  HENT:  Mouth/Throat:     Comments: Somewhat dry, no oropharyngeal lesions Eyes:     Extraocular Movements: Extraocular movements intact.     Conjunctiva/sclera: Conjunctivae normal.  Cardiovascular:     Rate and Rhythm: Normal rate and regular rhythm.     Pulses: Normal pulses.  Pulmonary:     Effort: Pulmonary effort is normal.     Breath sounds: Normal breath sounds. No wheezing, rhonchi or rales.  Abdominal:     General: Bowel sounds are normal.     Palpations: Abdomen is soft.     Tenderness: There is no right CVA tenderness, left CVA tenderness, guarding or rebound.     Comments: Diffuse tenderness most notable in left lower quadrant without rebound or guarding  Musculoskeletal:     Cervical back: Neck supple. No rigidity or tenderness.  Lymphadenopathy:     Cervical: No cervical adenopathy.  Skin:    General: Skin is warm and dry.     Comments: Good skin turgor  Neurological:     General: No focal deficit present.     Mental Status: She is alert and oriented to person, place, and time.  Psychiatric:        Mood and Affect: Mood normal.        Behavior: Behavior normal.      UC Treatments / Results  Labs (all labs ordered are listed, but only abnormal results are displayed) Labs Reviewed - No data to display  EKG   Radiology No results found.  Procedures Procedures (including critical care time)  Medications Ordered in UC Medications - No data to display  Initial Impression / Assessment and Plan / UC Course  I have reviewed the triage vital signs and the nursing notes.  Pertinent labs & imaging results that were available during my care of the patient were reviewed by me and considered in my medical decision making (see chart for details).  Diarrhea -ongoing since 5/8 on way home from trip to [place]. Suspect infectious -somewhat improved c Lomotil, but still up to 8 watery  stools/day -Will start cipro BID x 7 days -Mild clinical dehydration on exam.  Patient able to tolerate p.o.'s and feels she will be able to push fluids at home.  Encouraged to drink Pedialyte or other electrolyte replacement fluid -If unable to keep fluids down or has persistent diarrhea she will need to go directly to the ED -Otherwise follow-up PCP next week  Reviewed expections re: course of current medical issues. Questions answered. Outlined signs and symptoms indicating need for more acute intervention. Pt verbalized understanding. AVS given   Final Clinical Impressions(s) / UC Diagnoses   Final diagnoses:  Diarrhea of presumed infectious origin     Discharge Instructions     Take antibiotic as prescribed.  You can continue Lomotil as needed.  You need to really push Pedialyte as discussed.  I want you to follow-up with your primary in the next week.  You will need to go to urgency department for persistent or worsening symptoms.    ED Prescriptions    Medication Sig Dispense Auth. Provider   ciprofloxacin (CIPRO) 500 MG tablet Take 1 tablet (500 mg total) by mouth 2 (two) times daily for 7 days. 14 tablet Rolla Etienne, NP     PDMP not reviewed this encounter.   Rolla Etienne, NP 07/26/20 2109

## 2020-07-27 ENCOUNTER — Encounter: Payer: Self-pay | Admitting: Family Medicine

## 2020-07-27 ENCOUNTER — Telehealth (INDEPENDENT_AMBULATORY_CARE_PROVIDER_SITE_OTHER): Payer: 59 | Admitting: Family Medicine

## 2020-07-27 VITALS — Wt 300.0 lb

## 2020-07-27 DIAGNOSIS — R197 Diarrhea, unspecified: Secondary | ICD-10-CM

## 2020-07-27 DIAGNOSIS — R1084 Generalized abdominal pain: Secondary | ICD-10-CM

## 2020-07-27 NOTE — Progress Notes (Signed)
[facility]ARE [facility] [address] [place] [address] Dept: [phone] Dept Fax: [phone]  Virtual Video Visit  I connected with Diana Garrison on 07/27/20 at  3:30 PM EDT by a video enabled telemedicine application and verified that I am speaking with the correct person using two identifiers.  Location patient: Home Location provider: Clinic Persons participating in the virtual visit: Patient, Provider  I discussed the limitations of evaluation and management by telemedicine and the availability of in person appointments. The patient expressed understanding and agreed to proceed.  Chief Complaint  Patient presents with  . Acute Visit    C/o having diarrhea x 3 days, stomach pain x 1 week.   She went to UC yesterday and was given Cipro to take.  She states that she has been in the bed for the last 2 days.     SUBJECTIVE:  HPI: Diana Garrison is a 50 y.o. female who presents with ongoing complaint of abdominal pain and diarrhea. Diana Garrison states symptoms began on airplane ride home from [place] on 5/8.  She was having up to 15 watery stools a day. She tried to self-manage with Imodium and Pepto-Bismol. She was seen by Dr. Kateri Plummer, her PCP, for a video visit. Her episodes had decreased to approximately 8 times per day with Lomotil prescribed by Dr. Kateri Plummer.  However, as her symptoms were not resolving, she was seen yesterday at [facility]. She was prescribed Ciprofloxacin and given instructions for hydration and BRAT diet to be advanced as she improved. Today, [name] states she is having significant abdominal pain. She has taken 3 doses of her antibiotic so far. She notes she tried using Tylenol, ibuprofen, and had a taken one of her mother's Percocet. None of these have provided her any relief. She is hydrating with water, Gatorade, and Pedialyte.  Patient Active Problem List   Diagnosis Date Noted  . Supraspinatus  tendinitis, left 03/25/2019  . Plantar fasciitis 03/25/2019  . Achilles tendinitis 01/28/2019  . Spider veins of both lower extremities 10/17/2016  . Back pain 09/04/2016  . Menorrhagia 06/06/2013  . Morbid obesity with BMI of 40.0-44.9, adult (HCC) 06/06/2013  . Smoker 06/06/2013   Past Surgical History:  Procedure Laterality Date  . CESAREAN SECTION    . CESAREAN SECTION N/A    Phreesia 11/12/2019  . GANGLION CYST EXCISION     x2   Family History  Problem Relation Age of Onset  . Cirrhosis Father   . Pulmonary embolism Sister   . Breast cancer Neg Hx    Social History   Tobacco Use  . Smoking status: Current Every Day Smoker    Packs/day: 0.25    Types: Cigarettes    Start date: 10/10/2007  . Smokeless tobacco: Never Used  Vaping Use  . Vaping Use: Never used  Substance Use Topics  . Alcohol use: Yes    Comment: 4 drinks a week - red wine  . Drug use: Not Currently    Types: Marijuana    Current Outpatient Medications:  .  acetaminophen (TYLENOL) 325 MG tablet, Take 650 mg by mouth every 6 (six) hours as needed., Disp: , Rfl:  .  ciprofloxacin (CIPRO) 500 MG tablet, Take 1 tablet (500 mg total) by mouth 2 (two) times daily for 7 days., Disp: 14 tablet, Rfl: 0 .  diclofenac (VOLTAREN) 75 MG EC tablet, TAKE 1 TABLET BY MOUTH TWICE A DAY, Disp: 30 tablet, Rfl: 0 .  diphenoxylate-atropine (LOMOTIL) 2.5-0.025 MG tablet, Take 1 tablet by mouth 4 (four) times daily as needed for diarrhea or loose stools., Disp: 30 tablet, Rfl: 0 .  fluticasone (FLONASE) 50 MCG/ACT nasal spray, Place 2 sprays into both nostrils daily., Disp: 16 g, Rfl: 6 .  ibuprofen (ADVIL) 800 MG tablet, Take 1 tablet (800 mg total) by mouth every 8 (eight) hours as needed., Disp: 90 tablet, Rfl: 1 .  methocarbamol (ROBAXIN) 500 MG tablet, TAKE 1 TABLET BY MOUTH TWICE A DAY, Disp: 30 tablet, Rfl: 1 .  montelukast (SINGULAIR) 10 MG tablet, TAKE 1 TABLET BY MOUTH EVERYDAY AT BEDTIME, Disp: 90 tablet, Rfl:  1 .  naproxen (NAPROSYN) 500 MG tablet, TAKE 1 TABLET (500 MG TOTAL) BY MOUTH 2 (TWO) TIMES DAILY WITH A MEAL., Disp: 30 tablet, Rfl: 0 .  rosuvastatin (CRESTOR) 20 MG tablet, TAKE 1 TABLET BY MOUTH EVERY DAY, Disp: 90 tablet, Rfl: 3 .  amoxicillin-clavulanate (AUGMENTIN) 875-125 MG tablet, Take 1 tablet by mouth 2 (two) times daily. (Patient not taking: No sig reported), Disp: 14 tablet, Rfl: 0 .  buPROPion (ZYBAN) 150 MG 12 hr tablet, TAKE 1 TABLET BY MOUTH EVERY DAY (Patient not taking: No sig reported), Disp: 180 tablet, Rfl: 0 .  cyanocobalamin 100 MCG tablet, Take 100 mcg by mouth daily. (Patient not taking: No sig reported), Disp: , Rfl:  .  diclofenac (VOLTAREN) 75 MG EC tablet, Take 1 tablet (75 mg total) by mouth 2 (two) times daily. (Patient not taking: Reported on 07/27/2020), Disp: 30 tablet, Rfl: 0 .  estradiol (ESTRACE) 0.1 MG/GM vaginal cream, Place 1 Applicatorful vaginally at bedtime. (Patient not taking: Reported on 07/27/2020), Disp: 42.5 g, Rfl: 12 .  naproxen (NAPROSYN) 500 MG tablet, Take 1 tablet (500 mg total) by mouth 2 (two) times daily with a meal. (Patient not taking: Reported on 07/27/2020), Disp: 30 tablet, Rfl: 0 .  predniSONE (DELTASONE) 50 MG tablet, Take 1 tablet daily with breakfast for 3 days (Patient not taking: No sig reported), Disp: 3 tablet, Rfl: 0 .  valACYclovir (VALTREX) 1000 MG tablet, Take 2 pills at onset of cold sore and then repeat in 12 hours for each outbreak, Disp: 30 tablet, Rfl: 1 .  Vitamin D, Ergocalciferol, (DRISDOL) 1.25 MG (50000 UNIT) CAPS capsule, TAKE 1 CAPSULE (50,000 UNITS TOTAL) BY MOUTH EVERY 7 (SEVEN) DAYS. (Patient not taking: Reported on 07/23/2020), Disp: 12 capsule, Rfl: 1  No Known Allergies  ROS: See pertinent positives and negatives per HPI.  OBSERVATIONS/OBJECTIVE:  VITALS per patient if applicable: Today's Vitals   07/27/20 1538  Weight: 300 lb (136.1 kg)   Body mass index is 46.99 kg/m.   GENERAL: Alert and oriented.  Appears well and in no acute distress.  HEENT: Atraumatic. Conjunctiva clear. No obvious abnormalities on inspection of external nose and ears.  NECK: Normal movements of the head and neck.  LUNGS: On inspection, no signs of respiratory distress. Breathing rate appears normal. No obvious gross SOB, gasping or wheezing, and no conversational dyspnea.  CV: No obvious cyanosis.  MS: Moves all visible extremities without noticeable abnormality.  PSYCH/NEURO: Pleasant and cooperative. No obvious depression or anxiety. Speech and thought processing grossly intact.  ASSESSMENT AND PLAN:  1. Generalized abdominal pain 2. Diarrhea, unspecified type  I reviewed notes from Dr. Kateri Plummer and from the UC. Ms. Sorey has been diagnosed with Traveler's diarrhea due to the onset of symptoms on her trip back from [place]. She was initially treated with bismuth and antimotility  agents. She is now on ciprofloxacin. She continues to complain of sever abdominal pain. She divulged taking her mother's Percocet , but noted no improvement in her pain. I suspect that she may not have given the antibiotics enough time to work. However, I can not rule out other potential causes for her abdominal pain and diarrhea. A physical exam and potential lab testing would be helpful. I explained to Ms. Finder that I recommend she have an in-person evaluation and that I will have my MA call to assist her with scheduling. MS. Richner was very unhappy with this recommendation, expressing a belief that no one is doing anything to help her. She then closed the video connection prior to me concluding our visit.  Loyola Mast, MD

## 2020-07-27 NOTE — Patient Instructions (Signed)
Push fluids. Continue ciprofloxacin.

## 2020-07-28 ENCOUNTER — Ambulatory Visit (INDEPENDENT_AMBULATORY_CARE_PROVIDER_SITE_OTHER): Payer: 59 | Admitting: Family Medicine

## 2020-07-28 ENCOUNTER — Encounter: Payer: Self-pay | Admitting: Family Medicine

## 2020-07-28 ENCOUNTER — Other Ambulatory Visit: Payer: Self-pay

## 2020-07-28 VITALS — BP 120/76 | HR 81 | Temp 97.4°F | Ht 67.0 in | Wt 325.4 lb

## 2020-07-28 DIAGNOSIS — A09 Infectious gastroenteritis and colitis, unspecified: Secondary | ICD-10-CM

## 2020-07-28 MED ORDER — HYDROCODONE-ACETAMINOPHEN 10-325 MG PO TABS: 1.0000 | ORAL_TABLET | Freq: Three times a day (TID) | ORAL | 0 refills | Status: DC | PRN

## 2020-07-28 NOTE — Patient Instructions (Addendum)
Continue to push fluids (water, Gatorade, etc.) Use either Dulcolax or Senakot to address constipation.

## 2020-07-28 NOTE — Progress Notes (Signed)
[facility] [address] [place] [address] Dept: [phone] Dept Fax: [phone]  Office Visit  Subjective:    Patient ID: Diana Garrison, female    DOB: December 08, 1970, 50 y.o..   MRN: [id number]  Chief Complaint  Patient presents with  . Abdominal Pain    C/o having diarrhea x 1 week and stomach/abdominal pain x 3 days.  Has taken Percocet and Tylenol and Ibuprofen with little relief.     History of Present Illness:  Patient is in today with ongoing complaint of abdominal pain. Ms. Diveley started having diarrhea and abdominal pain on airplane ride home from [place] [facility]) on 5/8. She was having up to 15 watery stools a day. She tried to self-manage with Imodium and Pepto-Bismol. She was seen by Dr. Kateri Plummer, her PCP, for a video visit. Her episodes had decreased to approximately 8 times per day with Lomotil prescribed by Dr. Kateri Plummer. She has had no further diarrhea since Saturday. She was seen 2 days ago at [facility]. She was prescribed Ciprofloxacin and given instructions for hydration and BRAT diet to be advanced as she improved. I saw her for a video visit yesterday. [name] was still having significant abdominal pain. She had taken 3 doses of her antibiotic so far. She notes she tried using Tylenol, ibuprofen, and had a taken one of her mother's Percocet. None of these have provided her any relief. She has been hydrating with water, Gatorade, and Pedialyte.  Today, she notes some mild improvement in her symptoms, but has continued pain int he suprapubic and LLQ areas. She denies any hematochezia. She has noted some mucous in her stool. She had a small stool this morning, but has otherwise felt constipated since Saturday. She is eating, but less than her normal.  Past Medical History: Patient Active Problem List   Diagnosis Date Noted  . Supraspinatus tendinitis, left 03/25/2019  . Plantar fasciitis  03/25/2019  . Achilles tendinitis 01/28/2019  . Spider veins of both lower extremities 10/17/2016  . Back pain 09/04/2016  . Menorrhagia 06/06/2013  . Morbid obesity with BMI of 40.0-44.9, adult (HCC) 06/06/2013  . Smoker 06/06/2013   Past Surgical History:  Procedure Laterality Date  . CESAREAN SECTION    . CESAREAN SECTION N/A    Phreesia 11/12/2019  . GANGLION CYST EXCISION     x2   Family History  Problem Relation Age of Onset  . Cirrhosis Father   . Pulmonary embolism Sister   . Breast cancer Neg Hx    Outpatient Medications Prior to Visit  Medication Sig Dispense Refill  . acetaminophen (TYLENOL) 325 MG tablet Take 650 mg by mouth every 6 (six) hours as needed.    . ciprofloxacin (CIPRO) 500 MG tablet Take 1 tablet (500 mg total) by mouth 2 (two) times daily for 7 days. 14 tablet 0  . diclofenac (VOLTAREN) 75 MG EC tablet TAKE 1 TABLET BY MOUTH TWICE A DAY 30 tablet 0  . diphenoxylate-atropine (LOMOTIL) 2.5-0.025 MG tablet Take 1 tablet by mouth 4 (four) times daily as needed for diarrhea or loose stools. 30 tablet 0  . fluticasone (FLONASE) 50 MCG/ACT nasal spray Place 2 sprays into both nostrils daily. 16 g 6  . ibuprofen (ADVIL) 800 MG tablet Take 1 tablet (800 mg total) by mouth every 8 (eight) hours as needed. 90 tablet 1  . methocarbamol (ROBAXIN) 500 MG tablet TAKE 1 TABLET BY MOUTH TWICE A DAY 30 tablet 1  .  montelukast (SINGULAIR) 10 MG tablet TAKE 1 TABLET BY MOUTH EVERYDAY AT BEDTIME 90 tablet 1  . naproxen (NAPROSYN) 500 MG tablet TAKE 1 TABLET (500 MG TOTAL) BY MOUTH 2 (TWO) TIMES DAILY WITH A MEAL. 30 tablet 0  . rosuvastatin (CRESTOR) 20 MG tablet TAKE 1 TABLET BY MOUTH EVERY DAY 90 tablet 3   No facility-administered medications prior to visit.   No Known Allergies    Objective:   Today's Vitals   07/28/20 0852  BP: 120/76  Pulse: 81  Temp: (!) 97.4 F (36.3 C)  TempSrc: Temporal  SpO2: 97%  Weight: (!) 325 lb 6.4 oz (147.6 kg)  Height: 5\' 7"   (1.702 m)   Body mass index is 50.96 kg/m.   General: Well developed, well nourished. No acute distress. Abdomen: Mildly bloated. Mild to moderate tenderness to palpation in the LLQ and suprapubic area. Bowel sounds positive, normal  pitch and frequency. No hepatosplenomegaly. No rebound or guarding. Psych: Alert and oriented. Normal mood and affect.  Health Maintenance Due  Topic Date Due  . COLONOSCOPY (Pts 45-75yrs Insurance coverage will need to be confirmed)  Never done     Assessment & Plan:   1. Traveler's diarrhea Ms. Cappiello' exam is reassuring. I suspect she has some residual colitis related to her infection. She should complete the course of her Cipro. As she is now having some constipation, which may be causing some of her present discomfort. I advised her to try using either Dulcolax or Senakot for this. I will provide a small prescription of hydrocodone for discomfort if needed. If not improving, follow-up with either Dr. Yehuda Budd or me.   - HYDROcodone-acetaminophen (NORCO) 10-325 MG tablet; Take 1 tablet by mouth every 8 (eight) hours as needed for up to 5 doses.  Dispense: 5 tablet; Refill: 0   Kateri Plummer, MD

## 2020-08-02 ENCOUNTER — Other Ambulatory Visit: Payer: Self-pay

## 2020-08-02 ENCOUNTER — Other Ambulatory Visit: Payer: Self-pay | Admitting: Registered Nurse

## 2020-08-02 ENCOUNTER — Encounter: Payer: Self-pay | Admitting: Registered Nurse

## 2020-08-02 DIAGNOSIS — S99911A Unspecified injury of right ankle, initial encounter: Secondary | ICD-10-CM

## 2020-08-02 MED ORDER — METHOCARBAMOL 500 MG PO TABS: 1.0000 | ORAL_TABLET | Freq: Two times a day (BID) | ORAL | 1 refills | Status: DC

## 2020-08-02 NOTE — Telephone Encounter (Signed)
I need a refill for the Robaxin for m foot  LFD 01/27/20 #30 with 1 refill LOV  07/23/20 NOV none

## 2020-08-03 NOTE — Telephone Encounter (Signed)
Patient is requesting a refill of the following medications: Requested Prescriptions   Pending Prescriptions Disp Refills   naproxen (NAPROSYN) 500 MG tablet 30 tablet 0    Sig: Take 1 tablet (500 mg total) by mouth 2 (two) times daily with a meal.    Date of patient request: 08/02/2020 Last office visit: 07/28/20 Date of last refill: 11/27/19 Last refill amount: 30 0R

## 2020-08-03 NOTE — Telephone Encounter (Signed)
Patient is requesting a refill of the following medications: Requested Prescriptions   Pending Prescriptions Disp Refills   diclofenac (VOLTAREN) 75 MG EC tablet 30 tablet 0    Sig: Take 1 tablet (75 mg total) by mouth 2 (two) times daily.    Date of patient request: 08/02/20 Last office visit: 07/28/20 Date of last refill: 01/27/20 Last refill amount: 30 0R

## 2020-08-04 MED ORDER — DICLOFENAC SODIUM 75 MG PO TBEC: 75.0000 mg | DELAYED_RELEASE_TABLET | Freq: Two times a day (BID) | ORAL | 0 refills | Status: DC

## 2020-08-08 ENCOUNTER — Other Ambulatory Visit: Payer: Self-pay | Admitting: Registered Nurse

## 2020-08-24 ENCOUNTER — Other Ambulatory Visit: Payer: Self-pay | Admitting: Registered Nurse

## 2020-08-24 NOTE — Telephone Encounter (Signed)
LFD 08/10/20 #30 with no refills LOV 07/28/20 NOV none

## 2020-08-30 ENCOUNTER — Other Ambulatory Visit: Payer: Self-pay | Admitting: Registered Nurse

## 2020-08-30 DIAGNOSIS — S99911A Unspecified injury of right ankle, initial encounter: Secondary | ICD-10-CM

## 2020-09-08 ENCOUNTER — Ambulatory Visit: Payer: 59 | Admitting: Registered Nurse

## 2020-09-09 ENCOUNTER — Other Ambulatory Visit: Payer: Self-pay | Admitting: Registered Nurse

## 2020-09-09 ENCOUNTER — Other Ambulatory Visit: Payer: Self-pay

## 2020-09-09 ENCOUNTER — Encounter: Payer: Self-pay | Admitting: Registered Nurse

## 2020-09-09 ENCOUNTER — Ambulatory Visit (INDEPENDENT_AMBULATORY_CARE_PROVIDER_SITE_OTHER): Payer: 59 | Admitting: Registered Nurse

## 2020-09-09 VITALS — Temp 98.3°F | Resp 18 | Ht 67.0 in | Wt 323.4 lb

## 2020-09-09 DIAGNOSIS — M25579 Pain in unspecified ankle and joints of unspecified foot: Secondary | ICD-10-CM | POA: Diagnosis not present

## 2020-09-09 DIAGNOSIS — G8929 Other chronic pain: Secondary | ICD-10-CM

## 2020-09-09 DIAGNOSIS — Z6841 Body Mass Index (BMI) 40.0 and over, adult: Secondary | ICD-10-CM | POA: Diagnosis not present

## 2020-09-09 DIAGNOSIS — R7303 Prediabetes: Secondary | ICD-10-CM

## 2020-09-09 LAB — BASIC METABOLIC PANEL
BUN: 8 mg/dL (ref 6–23)
CO2: 27 mEq/L (ref 19–32)
Calcium: 9.4 mg/dL (ref 8.4–10.5)
Chloride: 106 mEq/L (ref 96–112)
Creatinine, Ser: 0.78 mg/dL (ref 0.40–1.20)
GFR: 89.09 mL/min (ref >60.00)
Glucose, Bld: 89 mg/dL (ref 70–99)
Potassium: 4.4 mEq/L (ref 3.5–5.1)
Sodium: 139 mEq/L (ref 135–145)

## 2020-09-09 LAB — URIC ACID: Uric Acid, Serum: 4.2 mg/dL (ref 2.4–7.0)

## 2020-09-09 LAB — HEMOGLOBIN A1C: Hgb A1c MFr Bld: 6.1 % (ref 4.6–6.5)

## 2020-09-09 MED ORDER — TRAMADOL HCL 50 MG PO TABS: 50.0000 mg | ORAL_TABLET | Freq: Three times a day (TID) | ORAL | 0 refills | Status: AC | PRN

## 2020-09-09 MED ORDER — METHYLPREDNISOLONE ACETATE 80 MG/ML IJ SUSP
80.0000 mg | Freq: Once | INTRAMUSCULAR | Status: AC
  Administered 2020-09-09: 80 mg via INTRAMUSCULAR

## 2020-09-09 MED ORDER — WEGOVY 0.25 MG/0.5ML ~~LOC~~ SOAJ: 0.2500 mg | SUBCUTANEOUS | 2 refills | Status: DC

## 2020-09-09 NOTE — Progress Notes (Signed)
Acute Office Visit  Subjective:    Patient ID: Diana Garrison, female    DOB: 11-05-1970, 50 y.o.   MRN: [id number]  Chief Complaint  Patient presents with   Ankle Pain    Patient states she is here for right ankle pain for about for a week And has made it hard to walk.    HPI Patient is in today for R ankle pain  Onset around 1 week ago No distinct injury or trauma that she can recall Was seen for R ankle pain by me in Sept 2021 - but had distinct trauma at that time with stepping in a hold in her yard - pain resolved after treatment with depo medrol inj, diclofenac and methocarbamol po, and tramadol for breakthrough pain  Has seen [facility] for this, MRI negative per patient  She does acknowledge weight has an impact from a biomechanical perspective, would be interested weight loss medication if available. Has not been on this before. Has tried dieting and exercising, has been consistent, no effect. Of note, prediabetic on last a1c around 10 mo ago, showing 6.2   Past Medical History:  Diagnosis Date   Hyperlipidemia    Phreesia 11/12/2019    Past Surgical History:  Procedure Laterality Date   CESAREAN SECTION     CESAREAN SECTION N/A    Phreesia 11/12/2019   GANGLION CYST EXCISION     x2    Family History  Problem Relation Age of Onset   Cirrhosis Father    Pulmonary embolism Sister    Breast cancer Neg Hx     Social History   Socioeconomic History   Marital status: Married    Spouse name: Not on file   Number of children: 2   Years of education: Not on file   Highest education level: Not on file  Occupational History   Occupation: CNA    Comment: [employer]  Tobacco Use   Smoking status: Every Day    Packs/day: 0.25    Pack years: 0.00    Types: Cigarettes    Start date: 10/10/2007   Smokeless tobacco: Never  Vaping Use   Vaping Use: Never used  Substance and Sexual Activity   Alcohol use: Yes    Comment: 4 drinks a week - red wine    Drug use: Not Currently    Types: Marijuana   Sexual activity: Yes    Partners: Male    Birth control/protection: None, Condom    Comment: 10/04/14: Not sexually active x5 mo due to spouse having had a CVA  Other Topics Concern   Not on file  Social History Narrative   Lives with daughter       Seatbelt - 100%      Guns in home - no   Social Determinants of Health   Financial Resource Strain: Not on file  Food Insecurity: Not on file  Transportation Needs: Not on file  Physical Activity: Not on file  Stress: Not on file  Social Connections: Not on file  Intimate Partner Violence: Not on file    Outpatient Medications Prior to Visit  Medication Sig Dispense Refill   acetaminophen (TYLENOL) 325 MG tablet Take 650 mg by mouth every 6 (six) hours as needed.     diclofenac (VOLTAREN) 75 MG EC tablet Take 1 tablet (75 mg total) by mouth 2 (two) times daily. 30 tablet 0   diphenoxylate-atropine (LOMOTIL) 2.5-0.025 MG tablet Take 1 tablet by mouth 4 (four) times daily as  needed for diarrhea or loose stools. 30 tablet 0   fluticasone (FLONASE) 50 MCG/ACT nasal spray Place 2 sprays into both nostrils daily. 16 g 6   HYDROcodone-acetaminophen (NORCO) 10-325 MG tablet Take 1 tablet by mouth every 8 (eight) hours as needed for up to 5 doses. 5 tablet 0   ibuprofen (ADVIL) 800 MG tablet Take 1 tablet (800 mg total) by mouth every 8 (eight) hours as needed. 90 tablet 1   methocarbamol (ROBAXIN) 500 MG tablet TAKE 1 TABLET BY MOUTH TWICE A DAY 30 tablet 1   montelukast (SINGULAIR) 10 MG tablet TAKE 1 TABLET BY MOUTH EVERYDAY AT BEDTIME 90 tablet 1   rosuvastatin (CRESTOR) 20 MG tablet TAKE 1 TABLET BY MOUTH EVERY DAY 90 tablet 3   naproxen (NAPROSYN) 500 MG tablet TAKE 1 TABLET BY MOUTH 2 TIMES DAILY WITH A MEAL. 30 tablet 0   No facility-administered medications prior to visit.    Not on File  Review of Systems  Constitutional: Negative.   HENT: Negative.    Eyes: Negative.    Respiratory: Negative.    Cardiovascular: Negative.   Gastrointestinal: Negative.   Genitourinary: Negative.   Musculoskeletal:  Positive for arthralgias (r ankle).  Skin: Negative.   Neurological: Negative.   Psychiatric/Behavioral: Negative.        Objective:    Physical Exam Vitals and nursing note reviewed.  Constitutional:      General: She is not in acute distress.    Appearance: Normal appearance. She is not ill-appearing, toxic-appearing or diaphoretic.  Cardiovascular:     Rate and Rhythm: Normal rate and regular rhythm.     Pulses: Normal pulses.     Heart sounds: Normal heart sounds. No murmur heard.   No friction rub. No gallop.  Pulmonary:     Effort: Pulmonary effort is normal. No respiratory distress.     Breath sounds: Normal breath sounds. No stridor. No wheezing, rhonchi or rales.  Chest:     Chest wall: No tenderness.  Musculoskeletal:        General: Swelling (lateral malleolus and surrounding area of r ankle) and tenderness (surrounding lateral malleolus of r ankle but not distinct bony tenderness) present. No deformity or signs of injury. Normal range of motion.     Right lower leg: No edema.     Left lower leg: No edema.  Skin:    General: Skin is warm and dry.     Capillary Refill: Capillary refill takes less than 2 seconds.  Neurological:     General: No focal deficit present.     Mental Status: She is alert and oriented to person, place, and time. Mental status is at baseline.  Psychiatric:        Mood and Affect: Mood normal.        Behavior: Behavior normal.        Thought Content: Thought content normal.        Judgment: Judgment normal.    Temp 98.3 F (36.8 C) (Temporal)   Resp 18   Ht 5\' 7"  (1.702 m)   Wt (!) 323 lb 6.4 oz (146.7 kg)   SpO2 99%   BMI 50.65 kg/m  Wt Readings from Last 3 Encounters:  09/09/20 (!) 323 lb 6.4 oz (146.7 kg)  07/28/20 (!) 325 lb 6.4 oz (147.6 kg)  07/27/20 300 lb (136.1 kg)    There are no  preventive care reminders to display for this patient.  There are no preventive care reminders to  display for this patient.   Lab Results  Component Value Date   TSH 0.652 11/14/2019   Lab Results  Component Value Date   WBC 10.9 (H) 11/14/2019   HGB 13.7 11/14/2019   HCT 43.8 11/14/2019   MCV 94 11/14/2019   PLT 276 11/06/2018   Lab Results  Component Value Date   NA 139 11/14/2019   K 4.6 11/14/2019   CO2 20 11/14/2019   GLUCOSE 143 (H) 11/14/2019   BUN 11 11/14/2019   CREATININE 0.72 11/14/2019   BILITOT <0.2 11/14/2019   ALKPHOS 109 11/14/2019   AST 17 11/14/2019   ALT 21 11/14/2019   PROT 6.5 11/14/2019   ALBUMIN 4.0 11/14/2019   CALCIUM 9.2 11/14/2019   Lab Results  Component Value Date   CHOL 227 (H) 11/14/2019   Lab Results  Component Value Date   HDL 50 11/14/2019   Lab Results  Component Value Date   LDLCALC 160 (H) 11/14/2019   Lab Results  Component Value Date   TRIG 93 11/14/2019   Lab Results  Component Value Date   CHOLHDL 4.5 (H) 11/14/2019   Lab Results  Component Value Date   HGBA1C 6.2 (H) 11/14/2019       Assessment & Plan:   Problem List Items Addressed This Visit   None Visit Diagnoses     Chronic ankle pain, unspecified laterality    -  Primary   Relevant Medications   traMADol (ULTRAM) 50 MG tablet   methylPREDNISolone acetate (DEPO-MEDROL) injection 80 mg (Start on 09/09/2020  1:00 PM)   Other Relevant Orders   Uric acid   Basic Metabolic Panel (BMET)   Ambulatory referral to Physical Therapy   Acute ankle pain, unspecified laterality       Relevant Medications   traMADol (ULTRAM) 50 MG tablet   methylPREDNISolone acetate (DEPO-MEDROL) injection 80 mg (Start on 09/09/2020  1:00 PM)   Other Relevant Orders   Uric acid   Basic Metabolic Panel (BMET)   BMI 50.0-59.9, adult (HCC)       Relevant Medications   Semaglutide-Weight Management (WEGOVY) 0.25 MG/0.5ML SOAJ   Prediabetes       Relevant Orders   Hemoglobin  A1c        Meds ordered this encounter  Medications   traMADol (ULTRAM) 50 MG tablet    Sig: Take 1 tablet (50 mg total) by mouth every 8 (eight) hours as needed for up to 5 days.    Dispense:  15 tablet    Refill:  0    Order Specific Question:   Supervising Provider    Answer:   Neva SeatGREENE, JEFFREY R [[id number]]   Semaglutide-Weight Management (WEGOVY) 0.25 MG/0.5ML SOAJ    Sig: Inject 0.25 mg into the skin once a week.    Dispense:  2 mL    Refill:  2    Order Specific Question:   Supervising Provider    Answer:   Neva SeatGREENE, JEFFREY R [[id number]]   methylPREDNISolone acetate (DEPO-MEDROL) injection 80 mg   PLAN Ankle pain, acute on chronic: good effect last time from depo medrol injection and tramadol. Will opt for this course again. Cautioned against overuse of NSAIDs - has rx for ibuprofen 600mg  PO qd Labs to rule out gout, has never had uric acid tested. Check BMET as well Weight loss: semaglutide 0.25mg  subq weekly. Discussed risks, benefits, and alternatives.  Check a1c given hx of prediabetes. Refer to PT Discussed nonpharm including RICE method and supportive shoes -  given recommendations to running stores for tailored recommendations on running shoes.  Patient encouraged to call clinic with any questions, comments, or concerns.  I spent 31 minutes with this patient discussing acute ankle pain, weight loss, and management of prediaetes.  Janeece Agee, NP

## 2020-09-09 NOTE — Patient Instructions (Addendum)
Diana Garrison -   Good to see you, bad that you're in pain.  Steroid injection today and tramadol for pain. Continue RICE method: Rest, ice, compress, elevate.  Recommend running stores: [employer] or [employer], locations here in [place] and also over in [place] I'll place referral to Physical Therapy Labs today will be back today or tomorrow - I'll let you know how they look I have sent Wegovy to use once weekly for weight loss - let me know if it's too expensive  [name]     If you have lab work done today you will be contacted with your lab results within the next 2 weeks.  If you have not heard from [place] then please contact us. The fastest way to get your results is to register for My Chart.   IF you received an x-ray today, you will receive an invoice from [facility]. Please contact [facility] at [phone] with questions or concerns regarding your invoice.   IF you received labwork today, you will receive an invoice from [place]. Please contact [facility] at [phone] with questions or concerns regarding your invoice.   Our billing staff will not be able to assist you with questions regarding bills from these companies.  You will be contacted with the lab results as soon as they are available. The fastest way to get your results is to activate your My Chart account. Instructions are located on the last page of this paperwork. If you have not heard from [place] regarding the results in 2 weeks, please contact this office.

## 2020-09-09 NOTE — Telephone Encounter (Signed)
LFD 08/24/20 #30 with no refills LOV 07/23/20 NOV 09/09/20

## 2020-09-17 ENCOUNTER — Other Ambulatory Visit: Payer: Self-pay | Admitting: Registered Nurse

## 2020-09-17 DIAGNOSIS — Z6841 Body Mass Index (BMI) 40.0 and over, adult: Secondary | ICD-10-CM

## 2020-09-17 MED ORDER — LIRAGLUTIDE 18 MG/3ML ~~LOC~~ SOPN: PEN_INJECTOR | SUBCUTANEOUS | 0 refills | Status: DC

## 2020-09-20 ENCOUNTER — Other Ambulatory Visit: Payer: Self-pay | Admitting: Registered Nurse

## 2020-09-20 DIAGNOSIS — Z6841 Body Mass Index (BMI) 40.0 and over, adult: Secondary | ICD-10-CM

## 2020-09-20 MED ORDER — DULAGLUTIDE 0.75 MG/0.5ML ~~LOC~~ SOAJ: 0.7500 mg | SUBCUTANEOUS | 0 refills | Status: DC

## 2020-09-20 NOTE — Telephone Encounter (Signed)
Have sent trulicity if we could try that Thanks,  Luan Pulling

## 2020-09-20 NOTE — Telephone Encounter (Signed)
Tried doing prior authorization through cover my meds and it states the mediction is not covered by the patient's insurance. They gave me a numnber to call and representative gave me the same information as well. He states they will cover bydureon, byetta, trulicity, or ozempic but all these medications will still require a prior authorization. Number for authorization is [phone]. Patient notified and states she is willing to try anything. Please advise.

## 2020-09-22 NOTE — Telephone Encounter (Signed)
Prior authorization initiated on website given by rx benefits service reprensentative. ([email or web address])(fax [phone]). Awaiting response.  Thank you for creating a prior authorization request using PromptPA. The prior authorization department will contact you if additional information is needed. Once a decision is made you will be notified of the decision. You can check the status of your request using the Check Status link. Please note down (Prior Auth EOC ID) to check status. Prior Authorization request details: Prior Auth (EOC) ID: [id number] Drug/Service Name: TRULICITY 0.75 MG/0.5 ML PEN Patient: Diana Garrison Date Requested: 09/22/2020 3:15:28 PM   MemberID: [id number] DOB: [birth year]

## 2020-09-24 ENCOUNTER — Other Ambulatory Visit: Payer: Self-pay | Admitting: Registered Nurse

## 2020-09-24 NOTE — Telephone Encounter (Signed)
Last filled 09/09/20 #30 with no refills Last visit 09/09/20 Next visit none

## 2020-10-01 ENCOUNTER — Other Ambulatory Visit: Payer: Self-pay | Admitting: Registered Nurse

## 2020-10-01 DIAGNOSIS — S99911A Unspecified injury of right ankle, initial encounter: Secondary | ICD-10-CM

## 2020-10-14 ENCOUNTER — Other Ambulatory Visit: Payer: Self-pay | Admitting: Registered Nurse

## 2020-10-14 NOTE — Telephone Encounter (Signed)
LFD 09/24/20 #30 with no refills LOV 09/09/20 NOV none

## 2020-11-02 ENCOUNTER — Ambulatory Visit: Payer: BC Managed Care – PPO | Admitting: Physical Therapy

## 2020-11-02 ENCOUNTER — Other Ambulatory Visit: Payer: Self-pay | Admitting: Registered Nurse

## 2020-11-02 NOTE — Telephone Encounter (Signed)
Patient is requesting a refill of the following medications: Requested Prescriptions   Pending Prescriptions Disp Refills   naproxen (NAPROSYN) 500 MG tablet [Pharmacy Med Name: NAPROXEN 500 MG TABLET] 30 tablet 0    Sig: TAKE 1 TABLET BY MOUTH 2 TIMES DAILY WITH A MEAL.    Date of patient request: 11/02/20 Last office visit: 09/09/20 Date of last refill: 10/14/20 Last refill amount: 30 Follow up time period per chart: PRN

## 2020-11-09 ENCOUNTER — Other Ambulatory Visit: Payer: Self-pay | Admitting: Registered Nurse

## 2020-11-09 DIAGNOSIS — S99911A Unspecified injury of right ankle, initial encounter: Secondary | ICD-10-CM

## 2020-11-09 NOTE — Telephone Encounter (Signed)
LFD 10/01/20 #30 with 1 refill LOV 09/09/20 NOV none

## 2020-11-11 ENCOUNTER — Other Ambulatory Visit: Payer: Self-pay

## 2020-11-11 ENCOUNTER — Encounter: Payer: Self-pay | Admitting: Physical Therapy

## 2020-11-11 ENCOUNTER — Ambulatory Visit: Payer: BC Managed Care – PPO | Admitting: Physical Therapy

## 2020-11-11 DIAGNOSIS — G8929 Other chronic pain: Secondary | ICD-10-CM | POA: Diagnosis not present

## 2020-11-11 DIAGNOSIS — M25571 Pain in right ankle and joints of right foot: Secondary | ICD-10-CM | POA: Diagnosis not present

## 2020-11-11 NOTE — Patient Instructions (Signed)
Access Code: [email or web address] URL: [email or web address] Date: 11/11/2020 Prepared by: Sedalia Muta  Exercises Gastroc Stretch on Wall - 2 x daily - 3 reps - 30 hold Heel Raises with Counter Support - 2 x daily - 2 sets - 10 reps

## 2020-11-11 NOTE — Therapy (Signed)
[facility] [address] [place], [place], [address] Phone: [phone]   Fax:  [phone]  Physical Therapy Evaluation  Patient Details  Name: Ivalee Strauser MRN: [id number] Date of Birth: September 29, 1970 Referring Provider (PT): Janeece Agee   Encounter Date: 11/11/2020   PT End of Session - 11/11/20 1129     Visit Number 1    Number of Visits 12    Date for PT Re-Evaluation 12/23/20    Authorization Type BCBS    PT Start Time 1016    PT Stop Time 1100    PT Time Calculation (min) 44 min    Activity Tolerance Patient tolerated treatment well    Behavior During Therapy [facility] for tasks assessed/performed             Past Medical History:  Diagnosis Date   Hyperlipidemia    Phreesia 11/12/2019    Past Surgical History:  Procedure Laterality Date   CESAREAN SECTION     CESAREAN SECTION N/A    Phreesia 11/12/2019   GANGLION CYST EXCISION     x2    There were no vitals filed for this visit.    Subjective Assessment - 11/11/20 1124     Subjective Pt states R ankle pain for 2 years. Did hae initial injury then, stepped in a hole,  saw ortho at that time, was in a boot, had MRI, pt states nothing was "torn" . She continues to have diffuculty with pain in foot, at times, with increased activity, longer standing, etc. She Works as med Best boy, [employer], standing for16 hr shift, 2 days/wk. Has to wear boot to work at times when foot gets aggravated. She states in the past she had pain in achilles region, and would have a "bump" on the back of her ankle as well. Pt not currently in pain today, but pain up to 5-7/10 when it flares up.    Limitations Standing;Walking;House hold activities    Patient Stated Goals decreased pain, decreased flare ups of foot.    Currently in Pain? No/denies    Pain Score 0-No pain                [facility] PT Assessment - 11/11/20 0001       Assessment   Medical Diagnosis R ankle pain     Referring Provider (PT) Janeece Agee    Prior Therapy no      Precautions   Precautions None      Balance Screen   Has the patient fallen in the past 6 months No      Prior Function   Level of Independence Independent      Cognition   Overall Cognitive Status Within Functional Limits for tasks assessed      Posture/Postural Control   Posture Comments foot: moderate flat foot bilaterally, fat pad breakdown R>L.      ROM / Strength   AROM / PROM / Strength AROM;Strength      AROM   Overall AROM Comments DF: 12 deg on R;  other ankle: WFL.      Strength   Overall Strength Comments R ankle: 4/5 to 4-/5,  unabe to do full heel raise with optimal posture (double leg)      Palpation   Palpation comment No tenderness in R achilles today, mild thickening of distal tendon.                        Objective  measurements completed on examination: See above findings.       [facility] Adult PT Treatment/Exercise - 11/11/20 0001       Exercises   Exercises Ankle      Ankle Exercises: Stretches   Gastroc Stretch 3 reps;30 seconds    Gastroc Stretch Limitations at wall      Ankle Exercises: Standing   Heel Raises 20 reps    Heel Raises Limitations ball at ankles for optimal mechanics.      Ankle Exercises: Supine   Other Supine Ankle Exercises Prone: DF AROM x 20;                    PT Education - 11/11/20 1129     Education Details PT POC, Exam findings, HEP    Person(s) Educated Patient    Methods Explanation;Demonstration;Tactile cues;Verbal cues;Handout    Comprehension Verbalized understanding;Returned demonstration;Verbal cues required;Tactile cues required;Need further instruction              PT Short Term Goals - 11/11/20 1131       PT SHORT TERM GOAL #1   Title Pt to be independent wtih initial HEP    Time 2    Period Weeks    Status New    Target Date 11/25/20               PT Long Term Goals - 11/11/20 1134        PT LONG TERM GOAL #1   Title Pt to be independent with final HEP    Time 6    Period Weeks    Status New    Target Date 12/23/20      PT LONG TERM GOAL #2   Title Pt to demo improved strength of R foot/ankle to be at least 4+/5, to improve ability for  optimal gait and heel rasies to have optimal mechanics    Time 6    Period Weeks    Status New    Target Date 12/23/20      PT LONG TERM GOAL #3   Title Pt to be compliant with foot wear that is appropriate for her foot type and diagnosis, to improve pain with longer standing and work duties.    Time 6    Period Weeks    Status New    Target Date 12/23/20      PT LONG TERM GOAL #4   Title Pt to report ability for standing activity on unevan surfaces, for at least 1 hr without pain. , to improve abilty for community activities.    Time 6    Period Weeks    Status New    Target Date 12/23/20                    Plan - 11/11/20 1137     Clinical Impression Statement Pt presents with primary complaint of ongoing pain and issues with R ankle, with frequent flare ups of pain. Pt with good ROM in ankle and with DF. She does have weakness in ankle, with poor ability and mechanics for heel raise. She has had previous symptoms of what sounds like achilles tendonitis, and is getting flare ups often with increased activity. She has minimal tenderness to palpate achilles today. Pt to benefit from strengthening for ankle, calf, stabililty, and education on HEP, to decrease liklihood of future flare ups .We will also discuss footwear at next visit, she will bring current sneakers she wears. Pt  to benefit from skiled PT to improve deficits and pain, and return to abilityfor functional activity without pain.    Personal Factors and Comorbidities Time since onset of injury/illness/exacerbation;Past/Current Experience    Examination-Activity Limitations Stairs;Squat;Stand;Locomotion Level;Lift    Examination-Participation Restrictions  Cleaning;Community Activity;Shop    Stability/Clinical Decision Making Stable/Uncomplicated    Clinical Decision Making Low    Rehab Potential Good    PT Frequency 1x / week    PT Duration 6 weeks    PT Treatment/Interventions ADLs/Self Care Home Management;Cryotherapy;Electrical Stimulation;Iontophoresis 4mg /ml Dexamethasone;Moist Heat;Traction;Balance training;Therapeutic exercise;Therapeutic activities;Functional mobility training;Stair training;Gait training;DME Instruction;Ultrasound;Neuromuscular re-education;Patient/family education;Orthotic Fit/Training;Manual techniques;Vasopneumatic Device;Taping;Dry needling;Passive range of motion;Spinal Manipulations;Joint Manipulations    PT Home Exercise Plan [email or web address]    Consulted and Agree with Plan of Care Patient             Patient will benefit from skilled therapeutic intervention in order to improve the following deficits and impairments:  Abnormal gait, Decreased activity tolerance, Pain, Decreased strength, Decreased balance, Decreased mobility, Improper body mechanics, Impaired flexibility  Visit Diagnosis: Chronic pain of right ankle     Problem List Patient Active Problem List   Diagnosis Date Noted   Supraspinatus tendinitis, left 03/25/2019   Plantar fasciitis 03/25/2019   Achilles tendinitis 01/28/2019   Spider veins of both lower extremities 10/17/2016   Back pain 09/04/2016   Menorrhagia 06/06/2013   Morbid obesity with BMI of 40.0-44.9, adult (HCC) 06/06/2013   Smoker 06/06/2013    06/08/2013, PT, DPT 11:52 AM  11/11/20    [facility] [address] [place], [place], [place] Phone: [phone]   Fax:  [phone]  Name: Sarit Sparano MRN: [name] Date of Birth: 1971-01-17

## 2020-12-02 ENCOUNTER — Telehealth: Payer: Self-pay

## 2020-12-02 NOTE — Telephone Encounter (Signed)
[facility] Prior Authorization Form placed in [employer] bin for Trulicity 0.75 mg

## 2020-12-02 NOTE — Telephone Encounter (Signed)
Yes! I have it here along with another one that I will do tomorrow.

## 2020-12-02 NOTE — Telephone Encounter (Signed)
Is there anyway you could try to do the PA for her tomorrow if you have the time.

## 2020-12-18 ENCOUNTER — Other Ambulatory Visit: Payer: Self-pay | Admitting: Registered Nurse

## 2020-12-26 IMAGING — MG MM DIGITAL SCREENING BILAT W/ CAD
7 series · 7 of 7 positions shown · non-contrast
Comparison: Previous exam(s).

CLINICAL DATA: Screening.

EXAM:
DIGITAL SCREENING BILATERAL MAMMOGRAM WITH CAD

[R MLO (1 of 2)]
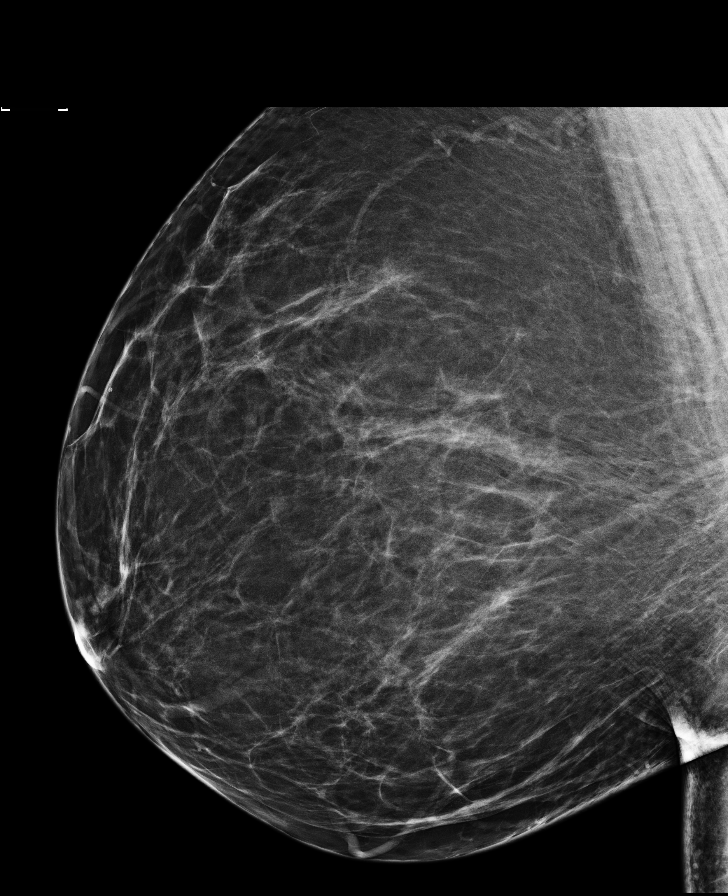

[R CC]
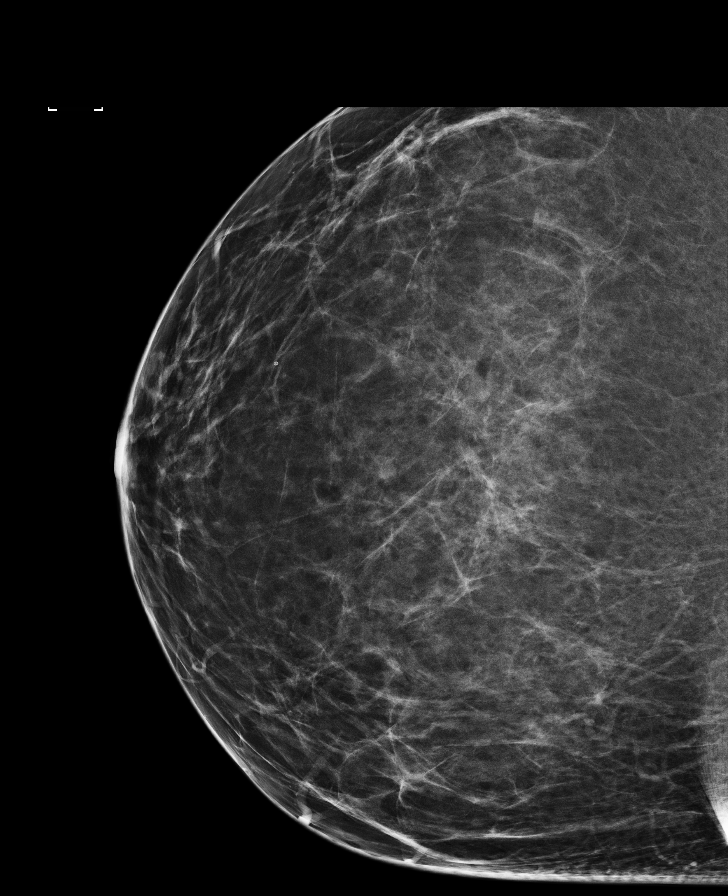

[R MLO (2 of 2)]
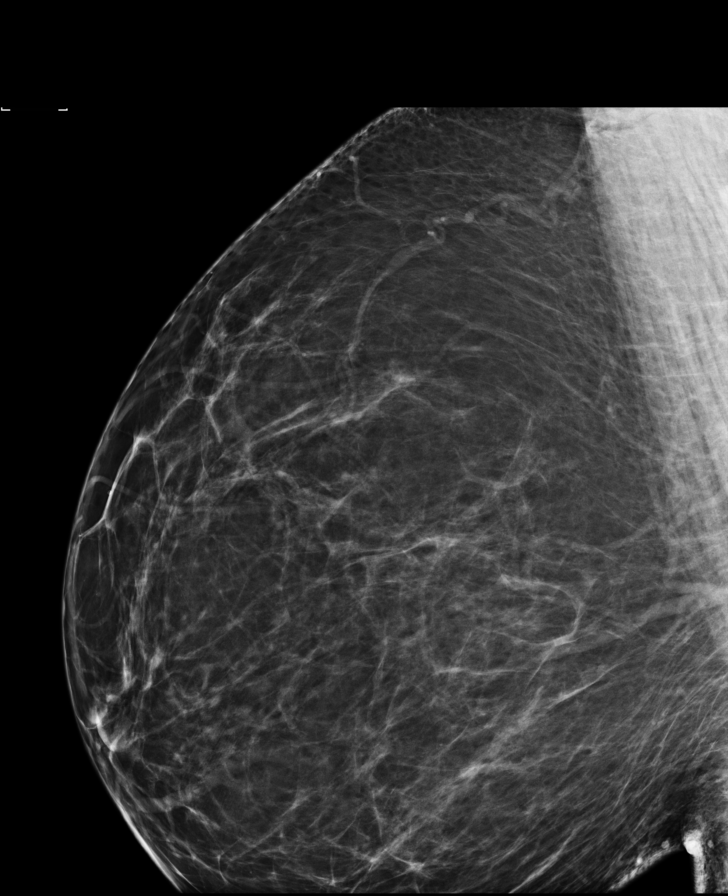

[L CV]
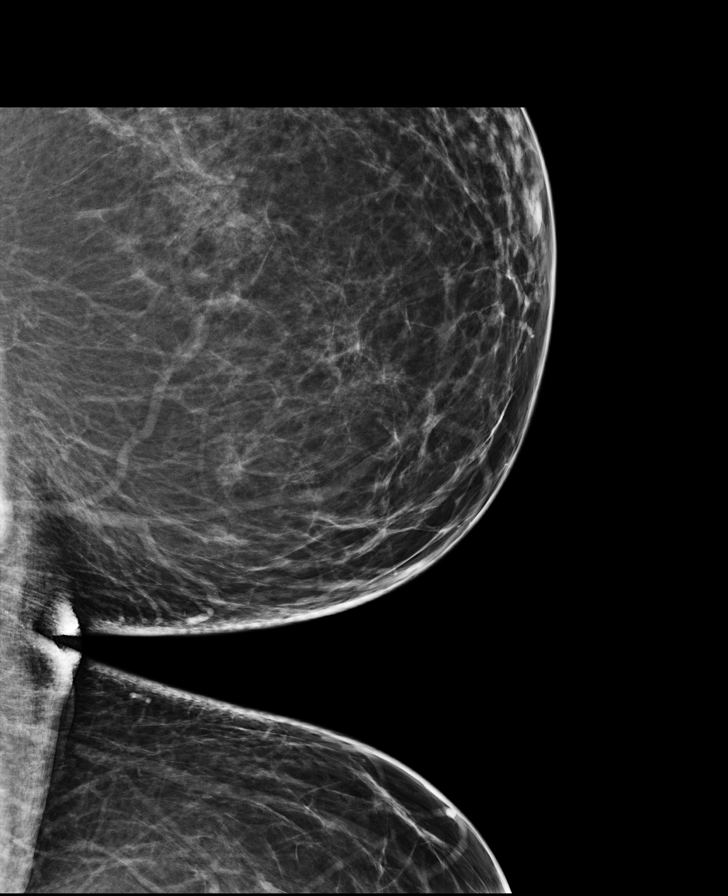

[L CC]
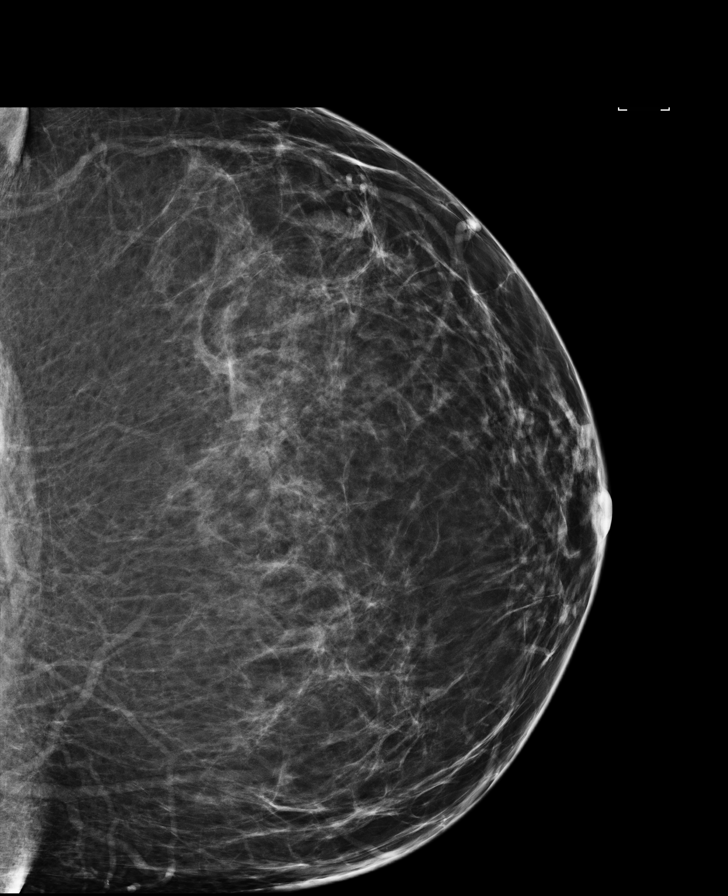

[L MLO (1 of 2)]
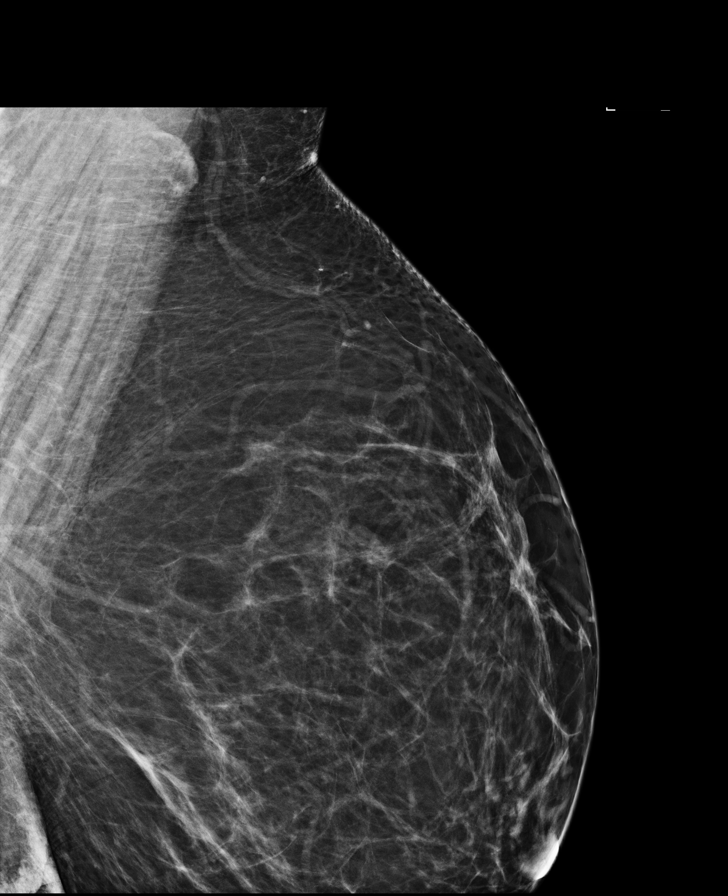

[L MLO (2 of 2)]
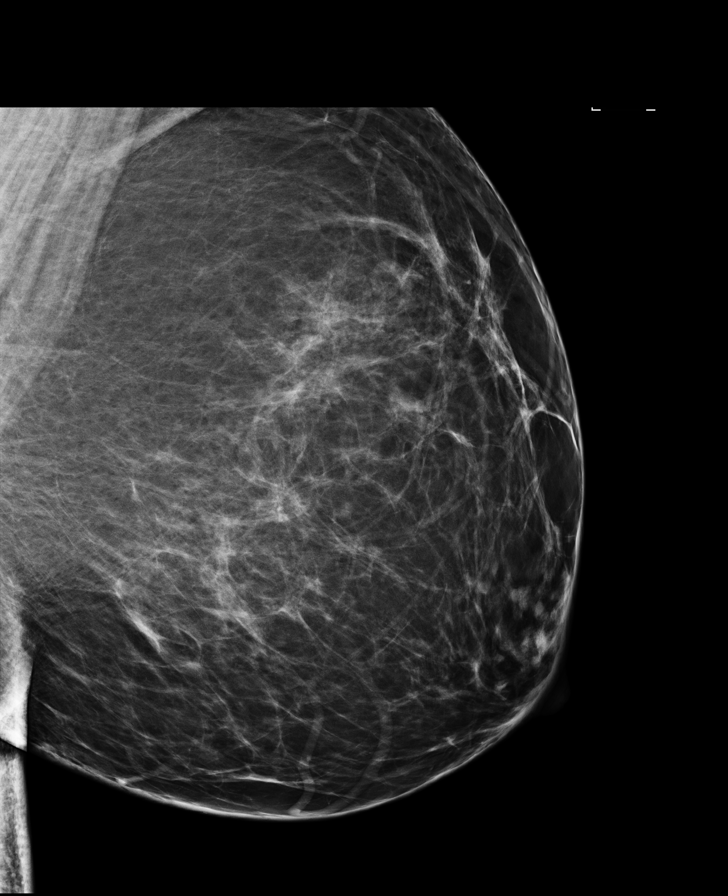

[7 of 7 positions shown; findings below may reference images not displayed]

ACR Breast Density Category b: There are scattered areas of
fibroglandular density.
FINDINGS: There are no findings suspicious for malignancy. Images were
processed with CAD.
IMPRESSION: No mammographic evidence of malignancy. A result letter of this
screening mammogram will be mailed directly to the patient.

RECOMMENDATION:
Screening mammogram in one year. (Code:AS-G-LCT)

BI-RADS CATEGORY  1: Negative.

## 2020-12-27 ENCOUNTER — Other Ambulatory Visit: Payer: Self-pay | Admitting: Registered Nurse

## 2020-12-27 DIAGNOSIS — S99911A Unspecified injury of right ankle, initial encounter: Secondary | ICD-10-CM

## 2020-12-27 NOTE — Telephone Encounter (Signed)
Patient is requesting a refill of the following medications: Requested Prescriptions   Pending Prescriptions Disp Refills   methocarbamol (ROBAXIN) 500 MG tablet [Pharmacy Med Name: METHOCARBAMOL 500 MG TABLET] 30 tablet 1    Sig: TAKE 1 TABLET BY MOUTH TWICE A DAY    Date of patient request: 12/27/2020 Last office visit: 09/09/2020 Date of last refill: 11/09/2020 Last refill amount: 30 tablets 1 refill Follow up time period per chart:

## 2021-01-04 ENCOUNTER — Other Ambulatory Visit: Payer: Self-pay | Admitting: Registered Nurse

## 2021-01-04 NOTE — Telephone Encounter (Signed)
Patient is requesting a refill of the following medications: Requested Prescriptions   Pending Prescriptions Disp Refills   naproxen (NAPROSYN) 500 MG tablet [Pharmacy Med Name: NAPROXEN 500 MG TABLET] 30 tablet 0    Sig: TAKE 1 TABLET BY MOUTH 2 TIMES DAILY WITH A MEAL.    Date of patient request: 01/04/2021 Last office visit: 09/09/2020 Date of last refill: 12/18/2020 Last refill amount: 30 tablets  Follow up time period per chart:

## 2021-01-11 ENCOUNTER — Encounter: Payer: Self-pay | Admitting: Registered Nurse

## 2021-01-12 ENCOUNTER — Other Ambulatory Visit: Payer: Self-pay

## 2021-01-12 ENCOUNTER — Ambulatory Visit (INDEPENDENT_AMBULATORY_CARE_PROVIDER_SITE_OTHER): Payer: 59 | Admitting: Registered Nurse

## 2021-01-12 ENCOUNTER — Encounter: Payer: Self-pay | Admitting: Registered Nurse

## 2021-01-12 VITALS — BP 122/61 | HR 77 | Temp 98.2°F | Resp 18 | Ht 67.0 in | Wt 324.8 lb

## 2021-01-12 DIAGNOSIS — H60392 Other infective otitis externa, left ear: Secondary | ICD-10-CM | POA: Diagnosis not present

## 2021-01-12 DIAGNOSIS — Z1211 Encounter for screening for malignant neoplasm of colon: Secondary | ICD-10-CM

## 2021-01-12 MED ORDER — HYDROCORTISONE-ACETIC ACID 1-2 % OT SOLN: 3.0000 [drp] | Freq: Three times a day (TID) | OTIC | 0 refills | Status: DC

## 2021-01-12 MED ORDER — OFLOXACIN 0.3 % OT SOLN: 5.0000 [drp] | Freq: Every day | OTIC | 0 refills | Status: DC

## 2021-01-12 NOTE — Patient Instructions (Addendum)
Ms. Diana Garrison to see you.  Use ofloxacin daily for 5 days.  Use vosol-hc three times daily as needed for discomfort or itching.  I have sent cologuard - this will be shipped to you.  Let me know if you have any concerns!  Thanks,  Rich     If you have lab work done today you will be contacted with your lab results within the next 2 weeks.  If you have not heard from [place] then please contact us. The fastest way to get your results is to register for My Chart.   IF you received an x-ray today, you will receive an invoice from [facility]. Please contact [facility] at [phone] with questions or concerns regarding your invoice.   IF you received labwork today, you will receive an invoice from [place]. Please contact [facility] at [phone] with questions or concerns regarding your invoice.   Our billing staff will not be able to assist you with questions regarding bills from these companies.  You will be contacted with the lab results as soon as they are available. The fastest way to get your results is to activate your My Chart account. Instructions are located on the last page of this paperwork. If you have not heard from [place] regarding the results in 2 weeks, please contact this office.

## 2021-01-12 NOTE — Progress Notes (Signed)
Established Patient Office Visit  Subjective:  Patient ID: Diana Garrison, female    DOB: 1970-06-02  Age: 50 y.o. MRN: [id number]  CC:  Chief Complaint  Patient presents with   Ear Pain    Patient states she has been having some ear pain for the left ear .    HPI [name] presents for ear pain  L ear Started over weekend Tenderness to outer ear Pain radiates to jaw and neck Sunday night into Monday woke her out of sleep. No drainage or bleeding from ear.  Does note her upcoming 50th birthday Interested in colon ca screen No fam hx of colon ca No symptoms Interested in cologuard Just started with [facility] - has insurance as of yesterday  Past Medical History:  Diagnosis Date   Hyperlipidemia    Phreesia 11/12/2019    Past Surgical History:  Procedure Laterality Date   CESAREAN SECTION     CESAREAN SECTION N/A    Phreesia 11/12/2019   GANGLION CYST EXCISION     x2    Family History  Problem Relation Age of Onset   Cirrhosis Father    Pulmonary embolism Sister    Breast cancer Neg Hx     Social History   Socioeconomic History   Marital status: Married    Spouse name: Not on file   Number of children: 2   Years of education: Not on file   Highest education level: Not on file  Occupational History   Occupation: CNA    Comment: [employer]  Tobacco Use   Smoking status: Every Day    Packs/day: 0.25    Types: Cigarettes    Start date: 10/10/2007   Smokeless tobacco: Never  Vaping Use   Vaping Use: Never used  Substance and Sexual Activity   Alcohol use: Yes    Comment: 4 drinks a week - red wine   Drug use: Not Currently    Types: Marijuana   Sexual activity: Yes    Partners: Male    Birth control/protection: None, Condom    Comment: 10/04/14: Not sexually active x5 mo due to spouse having had a CVA  Other Topics Concern   Not on file  Social History Narrative   Lives with daughter       Seatbelt - 100%      Guns in home - no    Social Determinants of Health   Financial Resource Strain: Not on file  Food Insecurity: Not on file  Transportation Needs: Not on file  Physical Activity: Not on file  Stress: Not on file  Social Connections: Not on file  Intimate Partner Violence: Not on file    Outpatient Medications Prior to Visit  Medication Sig Dispense Refill   acetaminophen (TYLENOL) 325 MG tablet Take 650 mg by mouth every 6 (six) hours as needed.     diclofenac (VOLTAREN) 75 MG EC tablet Take 1 tablet (75 mg total) by mouth 2 (two) times daily. 30 tablet 0   diphenoxylate-atropine (LOMOTIL) 2.5-0.025 MG tablet Take 1 tablet by mouth 4 (four) times daily as needed for diarrhea or loose stools. 30 tablet 0   Dulaglutide 0.75 MG/0.5ML SOPN Inject 0.75 mg into the skin once a week. 6 mL 0   fluticasone (FLONASE) 50 MCG/ACT nasal spray Place 2 sprays into both nostrils daily. 16 g 6   HYDROcodone-acetaminophen (NORCO) 10-325 MG tablet Take 1 tablet by mouth every 8 (eight) hours as needed for up to 5 doses. 5  tablet 0   ibuprofen (ADVIL) 800 MG tablet Take 1 tablet (800 mg total) by mouth every 8 (eight) hours as needed. 90 tablet 1   methocarbamol (ROBAXIN) 500 MG tablet TAKE 1 TABLET BY MOUTH TWICE A DAY 30 tablet 1   montelukast (SINGULAIR) 10 MG tablet TAKE 1 TABLET BY MOUTH EVERYDAY AT BEDTIME 90 tablet 1   naproxen (NAPROSYN) 500 MG tablet TAKE 1 TABLET BY MOUTH 2 TIMES DAILY WITH A MEAL. 30 tablet 0   rosuvastatin (CRESTOR) 20 MG tablet TAKE 1 TABLET BY MOUTH EVERY DAY 90 tablet 3   liraglutide (VICTOZA) 18 MG/3ML SOPN Inject 0.6 mg into the skin daily for 7 days, THEN 1.2 mg daily for 7 days, THEN 1.8 mg daily for 7 days, THEN 2.4 mg daily for 7 days, THEN 3 mg daily. 39 mL 0   No facility-administered medications prior to visit.    No Known Allergies  ROS Review of Systems  Constitutional: Negative.   HENT: Negative.    Eyes: Negative.   Respiratory: Negative.    Cardiovascular: Negative.    Gastrointestinal: Negative.   Genitourinary: Negative.   Musculoskeletal: Negative.   Skin: Negative.   Neurological: Negative.   Psychiatric/Behavioral: Negative.    All other systems reviewed and are negative.    Objective:    Physical Exam Vitals and nursing note reviewed.  Constitutional:      General: She is not in acute distress.    Appearance: Normal appearance. She is normal weight. She is not ill-appearing, toxic-appearing or diaphoretic.  HENT:     Right Ear: External ear normal. No decreased hearing noted. Swelling and tenderness present. No laceration or drainage. A middle ear effusion is present. There is no impacted cerumen. No mastoid tenderness.     Left Ear: Tympanic membrane, ear canal and external ear normal. There is no impacted cerumen.  Cardiovascular:     Rate and Rhythm: Normal rate and regular rhythm.     Heart sounds: Normal heart sounds. No murmur heard.   No friction rub. No gallop.  Pulmonary:     Effort: Pulmonary effort is normal. No respiratory distress.     Breath sounds: Normal breath sounds. No stridor. No wheezing, rhonchi or rales.  Chest:     Chest wall: No tenderness.  Skin:    General: Skin is warm and dry.  Neurological:     General: No focal deficit present.     Mental Status: She is alert and oriented to person, place, and time. Mental status is at baseline.  Psychiatric:        Mood and Affect: Mood normal.        Behavior: Behavior normal.        Thought Content: Thought content normal.        Judgment: Judgment normal.    BP 122/61   Pulse 77   Temp 98.2 F (36.8 C) (Temporal)   Resp 18   Ht 5\' 7"  (1.702 m)   Wt (!) 324 lb 12.8 oz (147.3 kg)   BMI 50.87 kg/m  Wt Readings from Last 3 Encounters:  01/12/21 (!) 324 lb 12.8 oz (147.3 kg)  09/09/20 (!) 323 lb 6.4 oz (146.7 kg)  07/28/20 (!) 325 lb 6.4 oz (147.6 kg)     Health Maintenance Due  Topic Date Due   COVID-19 Vaccine (3 - Booster for Moderna series)  06/26/2019    There are no preventive care reminders to display for this patient.  Lab Results  Component Value Date   TSH 0.652 11/14/2019   Lab Results  Component Value Date   WBC 10.9 (H) 11/14/2019   HGB 13.7 11/14/2019   HCT 43.8 11/14/2019   MCV 94 11/14/2019   PLT 276 11/06/2018   Lab Results  Component Value Date   NA 139 09/09/2020   K 4.4 09/09/2020   CO2 27 09/09/2020   GLUCOSE 89 09/09/2020   BUN 8 09/09/2020   CREATININE 0.78 09/09/2020   BILITOT <0.2 11/14/2019   ALKPHOS 109 11/14/2019   AST 17 11/14/2019   ALT 21 11/14/2019   PROT 6.5 11/14/2019   ALBUMIN 4.0 11/14/2019   CALCIUM 9.4 09/09/2020   GFR 89.09 09/09/2020   Lab Results  Component Value Date   CHOL 227 (H) 11/14/2019   Lab Results  Component Value Date   HDL 50 11/14/2019   Lab Results  Component Value Date   LDLCALC 160 (H) 11/14/2019   Lab Results  Component Value Date   TRIG 93 11/14/2019   Lab Results  Component Value Date   CHOLHDL 4.5 (H) 11/14/2019   Lab Results  Component Value Date   HGBA1C 6.1 09/09/2020      Assessment & Plan:   Problem List Items Addressed This Visit   None Visit Diagnoses     Other infective acute otitis externa of left ear    -  Primary   Relevant Medications   ofloxacin (FLOXIN OTIC) 0.3 % OTIC solution   acetic acid-hydrocortisone (VOSOL-HC) OTIC solution   Colon cancer screening       Relevant Orders   Cologuard       Meds ordered this encounter  Medications   ofloxacin (FLOXIN OTIC) 0.3 % OTIC solution    Sig: Place 5 drops into the left ear daily.    Dispense:  5 mL    Refill:  0    Order Specific Question:   Supervising Provider    Answer:   Neva Seat, JEFFREY R [[id number]]   acetic acid-hydrocortisone (VOSOL-HC) OTIC solution    Sig: Place 3 drops into the left ear 3 (three) times daily.    Dispense:  10 mL    Refill:  0    Order Specific Question:   Supervising Provider    Answer:   Neva Seat, JEFFREY R [[id number]]     Follow-up: Return if symptoms worsen or fail to improve.   PLAN Cologuard ordered. Will follow up pending results. Acute otitis externa. Ofloxacin drops and vosol-hc for relief. Return precautions reviewed .Patient encouraged to call clinic with any questions, comments, or concerns.  Janeece Agee, NP

## 2021-02-14 ENCOUNTER — Other Ambulatory Visit: Payer: Self-pay | Admitting: Registered Nurse

## 2021-02-14 DIAGNOSIS — S99911A Unspecified injury of right ankle, initial encounter: Secondary | ICD-10-CM

## 2021-03-01 ENCOUNTER — Other Ambulatory Visit: Payer: Self-pay | Admitting: Registered Nurse

## 2021-03-01 ENCOUNTER — Encounter: Payer: Self-pay | Admitting: Registered Nurse

## 2021-03-01 DIAGNOSIS — S99911A Unspecified injury of right ankle, initial encounter: Secondary | ICD-10-CM

## 2021-03-01 MED ORDER — DICLOFENAC SODIUM 75 MG PO TBEC: 75.0000 mg | DELAYED_RELEASE_TABLET | Freq: Two times a day (BID) | ORAL | 0 refills | Status: DC

## 2021-03-01 MED ORDER — METHOCARBAMOL 500 MG PO TABS: 500.0000 mg | ORAL_TABLET | Freq: Three times a day (TID) | ORAL | 1 refills | Status: DC | PRN

## 2021-03-02 ENCOUNTER — Telehealth: Payer: 59 | Admitting: Family

## 2021-03-02 DIAGNOSIS — M545 Low back pain, unspecified: Secondary | ICD-10-CM

## 2021-03-02 MED ORDER — NAPROXEN 500 MG PO TABS: 500.0000 mg | ORAL_TABLET | Freq: Two times a day (BID) | ORAL | 0 refills | Status: DC

## 2021-03-02 MED ORDER — BACLOFEN 10 MG PO TABS: 10.0000 mg | ORAL_TABLET | Freq: Three times a day (TID) | ORAL | 0 refills | Status: DC

## 2021-03-02 NOTE — Progress Notes (Signed)

## 2021-03-15 ENCOUNTER — Other Ambulatory Visit: Payer: Self-pay

## 2021-03-15 ENCOUNTER — Other Ambulatory Visit: Payer: Self-pay | Admitting: Registered Nurse

## 2021-03-15 ENCOUNTER — Ambulatory Visit (INDEPENDENT_AMBULATORY_CARE_PROVIDER_SITE_OTHER): Payer: No Typology Code available for payment source | Admitting: Registered Nurse

## 2021-03-15 ENCOUNTER — Ambulatory Visit
Admission: RE | Admit: 2021-03-15 | Discharge: 2021-03-15 | Disposition: A | Discharge status: Home / Self Care | Payer: No Typology Code available for payment source | Source: Ambulatory Visit | Attending: Registered Nurse | Admitting: Registered Nurse

## 2021-03-15 DIAGNOSIS — Z6841 Body Mass Index (BMI) 40.0 and over, adult: Secondary | ICD-10-CM

## 2021-03-15 DIAGNOSIS — Z1231 Encounter for screening mammogram for malignant neoplasm of breast: Secondary | ICD-10-CM

## 2021-03-15 DIAGNOSIS — R0681 Apnea, not elsewhere classified: Secondary | ICD-10-CM | POA: Diagnosis not present

## 2021-03-15 DIAGNOSIS — F331 Major depressive disorder, recurrent, moderate: Secondary | ICD-10-CM

## 2021-03-15 MED ORDER — SEMAGLUTIDE-WEIGHT MANAGEMENT 0.5 MG/0.5ML ~~LOC~~ SOAJ: 0.5000 mg | SUBCUTANEOUS | 0 refills | Status: DC

## 2021-03-15 MED ORDER — VENLAFAXINE HCL ER 37.5 MG PO CP24: 37.5000 mg | ORAL_CAPSULE | Freq: Every day | ORAL | 0 refills | Status: DC

## 2021-03-15 MED ORDER — SEMAGLUTIDE (1 MG/DOSE) 4 MG/3ML ~~LOC~~ SOPN: 1.0000 mg | PEN_INJECTOR | SUBCUTANEOUS | 0 refills | Status: DC

## 2021-03-15 MED ORDER — CYCLOBENZAPRINE HCL 10 MG PO TABS
5.0000 mg | ORAL_TABLET | Freq: Three times a day (TID) | ORAL | 0 refills | Status: DC | PRN
Start: 2021-03-15 — End: 2021-04-08

## 2021-03-15 NOTE — Progress Notes (Signed)
Established Patient Office Visit  Subjective:  Patient ID: Diana Garrison, female    DOB: 1971-01-02  Age: 51 y.o. MRN: [id number]  CC:  Chief Complaint  Patient presents with   Sleep Apnea    Possible OSA pt reports catches herself snoring, pt reports feeling tired the next day usually. Feels it can be hard to catch her breath at times    Back Pain    Pt reports lower back pain starting apx 2 days ago, was bed ridden 2 weeks ago,  some improvement but not resolved. Pt did have a virtual visit for this     HPI Diana Garrison presents for sleep concern and back pain  OSA Witnessed apnea, snoring, nonrestorative sleep, wakes with very dry mouth Would like referral for sleep study.   Back pain No acute injury or trauma Had Evisit with Sherre Scarlet NP  Given naproxen and methocarbamol. Minimal relief.   Depression Ongoing. Worsening. Had been on wellbutrin but could not tolerate due to decreased libido.  Feels down most days. Able to keep busy sometimes but other times notes she spirals.  Interested in counseling - now is on [employer] and plans to use [facility].  Weight management Interested in [place]  Had tried to get trulicity or liraglutide covered last year - no luck She has new insurance. Would like to try again Continues healthy diet and routine exercise as tolerated.  Past Medical History:  Diagnosis Date   Hyperlipidemia    Phreesia 11/12/2019    Past Surgical History:  Procedure Laterality Date   CESAREAN SECTION     CESAREAN SECTION N/A    Phreesia 11/12/2019   GANGLION CYST EXCISION     x2    Family History  Problem Relation Age of Onset   Cirrhosis Father    Pulmonary embolism Sister    Breast cancer Neg Hx     Social History   Socioeconomic History   Marital status: Married    Spouse name: Not on file   Number of children: 2   Years of education: Not on file   Highest education level: Not on file  Occupational History    Occupation: CNA    Comment: [employer]  Tobacco Use   Smoking status: Every Day    Packs/day: 0.25    Types: Cigarettes    Start date: 10/10/2007   Smokeless tobacco: Never  Vaping Use   Vaping Use: Never used  Substance and Sexual Activity   Alcohol use: Yes    Comment: 4 drinks a week - red wine   Drug use: Not Currently    Types: Marijuana   Sexual activity: Yes    Partners: Male    Birth control/protection: None, Condom    Comment: 10/04/14: Not sexually active x5 mo due to spouse having had a CVA  Other Topics Concern   Not on file  Social History Narrative   Lives with daughter       Seatbelt - 100%      Guns in home - no   Social Determinants of Health   Financial Resource Strain: Not on file  Food Insecurity: Not on file  Transportation Needs: Not on file  Physical Activity: Not on file  Stress: Not on file  Social Connections: Not on file  Intimate Partner Violence: Not on file    Outpatient Medications Prior to Visit  Medication Sig Dispense Refill   acetaminophen (TYLENOL) 325 MG tablet Take 650 mg by  mouth every 6 (six) hours as needed.     acetic acid-hydrocortisone (VOSOL-HC) OTIC solution Place 3 drops into the left ear 3 (three) times daily. 10 mL 0   baclofen (LIORESAL) 10 MG tablet Take 1 tablet (10 mg total) by mouth 3 (three) times daily. 30 each 0   diclofenac (VOLTAREN) 75 MG EC tablet Take 1 tablet (75 mg total) by mouth 2 (two) times daily. 30 tablet 0   diphenoxylate-atropine (LOMOTIL) 2.5-0.025 MG tablet Take 1 tablet by mouth 4 (four) times daily as needed for diarrhea or loose stools. 30 tablet 0   fluticasone (FLONASE) 50 MCG/ACT nasal spray Place 2 sprays into both nostrils daily. 16 g 6   HYDROcodone-acetaminophen (NORCO) 10-325 MG tablet Take 1 tablet by mouth every 8 (eight) hours as needed for up to 5 doses. 5 tablet 0   ibuprofen (ADVIL) 800 MG tablet Take 1 tablet (800 mg total) by mouth every 8 (eight) hours as needed. 90 tablet 1    methocarbamol (ROBAXIN) 500 MG tablet Take 1 tablet (500 mg total) by mouth every 8 (eight) hours as needed for muscle spasms. 30 tablet 1   montelukast (SINGULAIR) 10 MG tablet TAKE 1 TABLET BY MOUTH EVERYDAY AT BEDTIME 90 tablet 1   naproxen (NAPROSYN) 500 MG tablet Take 1 tablet (500 mg total) by mouth 2 (two) times daily with a meal. 30 tablet 0   ofloxacin (FLOXIN OTIC) 0.3 % OTIC solution Place 5 drops into the left ear daily. 5 mL 0   rosuvastatin (CRESTOR) 20 MG tablet TAKE 1 TABLET BY MOUTH EVERY DAY 90 tablet 3   Dulaglutide 0.75 MG/0.5ML SOPN Inject 0.75 mg into the skin once a week. 6 mL 0   liraglutide (VICTOZA) 18 MG/3ML SOPN Inject 0.6 mg into the skin daily for 7 days, THEN 1.2 mg daily for 7 days, THEN 1.8 mg daily for 7 days, THEN 2.4 mg daily for 7 days, THEN 3 mg daily. 39 mL 0   No facility-administered medications prior to visit.    No Known Allergies  ROS Review of Systems Per hpi     Objective:    Physical Exam Vitals and nursing note reviewed.  Constitutional:      General: She is not in acute distress.    Appearance: Normal appearance. She is normal weight. She is not ill-appearing, toxic-appearing or diaphoretic.  Cardiovascular:     Rate and Rhythm: Normal rate and regular rhythm.     Heart sounds: Normal heart sounds. No murmur heard.   No friction rub. No gallop.  Pulmonary:     Effort: Pulmonary effort is normal. No respiratory distress.     Breath sounds: Normal breath sounds. No stridor. No wheezing, rhonchi or rales.  Chest:     Chest wall: No tenderness.  Skin:    General: Skin is warm and dry.  Neurological:     General: No focal deficit present.     Mental Status: She is alert and oriented to person, place, and time. Mental status is at baseline.  Psychiatric:        Attention and Perception: Attention and perception normal.        Mood and Affect: Mood is depressed. Affect is tearful.        Behavior: Behavior normal.        Thought  Content: Thought content normal.        Judgment: Judgment normal.    BP 128/76    Pulse 79  Temp 98.2 F (36.8 C) (Temporal)    Resp 17    Ht 5\' 7"  (1.702 m)    Wt (!) 326 lb 6.4 oz (148.1 kg)    SpO2 97%    BMI 51.12 kg/m  Wt Readings from Last 3 Encounters:  03/15/21 (!) 326 lb 6.4 oz (148.1 kg)  01/12/21 (!) 324 lb 12.8 oz (147.3 kg)  09/09/20 (!) 323 lb 6.4 oz (146.7 kg)     Health Maintenance Due  Topic Date Due   COVID-19 Vaccine (3 - Booster for Moderna series) 06/26/2019   MAMMOGRAM  02/09/2021   Zoster Vaccines- Shingrix (1 of 2) Never done    There are no preventive care reminders to display for this patient.  Lab Results  Component Value Date   TSH 0.652 11/14/2019   Lab Results  Component Value Date   WBC 10.9 (H) 11/14/2019   HGB 13.7 11/14/2019   HCT 43.8 11/14/2019   MCV 94 11/14/2019   PLT 276 11/06/2018   Lab Results  Component Value Date   NA 139 09/09/2020   K 4.4 09/09/2020   CO2 27 09/09/2020   GLUCOSE 89 09/09/2020   BUN 8 09/09/2020   CREATININE 0.78 09/09/2020   BILITOT <0.2 11/14/2019   ALKPHOS 109 11/14/2019   AST 17 11/14/2019   ALT 21 11/14/2019   PROT 6.5 11/14/2019   ALBUMIN 4.0 11/14/2019   CALCIUM 9.4 09/09/2020   GFR 89.09 09/09/2020   Lab Results  Component Value Date   CHOL 227 (H) 11/14/2019   Lab Results  Component Value Date   HDL 50 11/14/2019   Lab Results  Component Value Date   LDLCALC 160 (H) 11/14/2019   Lab Results  Component Value Date   TRIG 93 11/14/2019   Lab Results  Component Value Date   CHOLHDL 4.5 (H) 11/14/2019   Lab Results  Component Value Date   HGBA1C 6.1 09/09/2020      Assessment & Plan:   Problem List Items Addressed This Visit   None Visit Diagnoses     Morbid obesity with BMI of 50.0-59.9, adult ([place])    -  Primary   Relevant Medications   Semaglutide-Weight Management 0.5 MG/0.5ML SOAJ   Semaglutide, 1 MG/DOSE, 4 MG/3ML SOPN   Other Relevant Orders   Ambulatory  referral to Pulmonology   Moderate episode of recurrent major depressive disorder (HCC)       Relevant Medications   venlafaxine XR (EFFEXOR XR) 37.5 MG 24 hr capsule   cyclobenzaprine (FLEXERIL) 10 MG tablet   Witnessed episode of apnea       Relevant Orders   Ambulatory referral to Pulmonology       Meds ordered this encounter  Medications   Semaglutide-Weight Management 0.5 MG/0.5ML SOAJ    Sig: Inject 0.5 mg into the skin once a week.    Dispense:  2 mL    Refill:  0    Order Specific Question:   Supervising Provider    Answer:   Carlota Raspberry, JEFFREY R [[id number]]   Semaglutide, 1 MG/DOSE, 4 MG/3ML SOPN    Sig: Inject 1 mg as directed once a week.    Dispense:  9 mL    Refill:  0    Order Specific Question:   Supervising Provider    Answer:   Carlota Raspberry, JEFFREY R [[id number]]   venlafaxine XR (EFFEXOR XR) 37.5 MG 24 hr capsule    Sig: Take 1 capsule (37.5 mg total) by mouth daily with  breakfast.    Dispense:  90 capsule    Refill:  0    Order Specific Question:   Supervising Provider    Answer:   Carlota Raspberry, JEFFREY R [[id number]]   cyclobenzaprine (FLEXERIL) 10 MG tablet    Sig: Take 0.5-1 tablets (5-10 mg total) by mouth 3 (three) times daily as needed for muscle spasms.    Dispense:  30 tablet    Refill:  0    Order Specific Question:   Supervising Provider    Answer:   Carlota Raspberry, JEFFREY R [[id number]]    Follow-up: No follow-ups on file.   PLAN Refer to pulmonary for OSA work up Start venlafaxine 37.5mg  po qd. Med check at [facility] in 6 weeks. She will call [facility] to set up counseling. Switch to flexeril for back pain. Sending semaglutide for weight management. If not covered will explore alternatives. Patient encouraged to call clinic with any questions, comments, or concerns.  Maximiano Coss, NP

## 2021-03-30 ENCOUNTER — Telehealth: Payer: No Typology Code available for payment source | Admitting: Family

## 2021-03-30 DIAGNOSIS — J069 Acute upper respiratory infection, unspecified: Secondary | ICD-10-CM

## 2021-03-30 LAB — COLOGUARD: COLOGUARD: NEGATIVE

## 2021-03-30 MED ORDER — FLUTICASONE PROPIONATE 50 MCG/ACT NA SUSP: 2.0000 | Freq: Every day | NASAL | 6 refills | Status: DC

## 2021-03-30 MED ORDER — BENZONATATE 100 MG PO CAPS: 100.0000 mg | ORAL_CAPSULE | Freq: Three times a day (TID) | ORAL | 0 refills | Status: DC | PRN

## 2021-03-30 NOTE — Progress Notes (Signed)

## 2021-04-01 ENCOUNTER — Telehealth: Payer: Self-pay | Admitting: Registered Nurse

## 2021-04-01 ENCOUNTER — Other Ambulatory Visit: Payer: Self-pay | Admitting: Registered Nurse

## 2021-04-01 DIAGNOSIS — J029 Acute pharyngitis, unspecified: Secondary | ICD-10-CM

## 2021-04-01 MED ORDER — AMOXICILLIN-POT CLAVULANATE 875-125 MG PO TABS: 1.0000 | ORAL_TABLET | Freq: Two times a day (BID) | ORAL | 0 refills | Status: DC

## 2021-04-01 NOTE — Telephone Encounter (Signed)
I called and spoke with the patient and informed her that the provider sent in a antibiotic for her to start and she is to take the entire course even if she feels better. If she does not improve she needs to let us know and she understood.  Cleophas Yoak,cma

## 2021-04-01 NOTE — Telephone Encounter (Signed)
Augmentin po bid x 10 days sent Finish entire course even if feeling better  Call if worsening or failing to improve  Thanks,  Rich

## 2021-04-01 NOTE — Telephone Encounter (Signed)
Pt called in stating that she had an E visit on 1/18, she states that she has been doing what she was told to do but she isn't getting better. She states that she is now having a runny nose, sore throat, hoarseness, and coughing up yellow phlegm.   Please if an antibiotic can be called in or what else should she be doing.  Pt uses [facility] on [address]   Home #

## 2021-04-08 ENCOUNTER — Other Ambulatory Visit: Payer: Self-pay | Admitting: Registered Nurse

## 2021-04-08 ENCOUNTER — Encounter: Payer: Self-pay | Admitting: Registered Nurse

## 2021-04-08 DIAGNOSIS — F331 Major depressive disorder, recurrent, moderate: Secondary | ICD-10-CM

## 2021-04-08 MED ORDER — CYCLOBENZAPRINE HCL 10 MG PO TABS: 5.0000 mg | ORAL_TABLET | Freq: Three times a day (TID) | ORAL | 0 refills | Status: DC | PRN

## 2021-04-08 NOTE — Telephone Encounter (Signed)
Patient was given Augmentin on 04/01/2021 and now she believes she has since developed an yeast infection from it. She wants to know if its something else that can be sent to her pharmacy.

## 2021-04-08 NOTE — Telephone Encounter (Signed)
Patient is requesting a refill of the following medications: Requested Prescriptions   Pending Prescriptions Disp Refills   cyclobenzaprine (FLEXERIL) 10 MG tablet 30 tablet 0    Sig: Take 0.5-1 tablets (5-10 mg total) by mouth 3 (three) times daily as needed for muscle spasms.    Date of patient request: 04/08/2021 Last office visit: 03/15/2021 Date of last refill: 03/15/2021 Last refill amount: 30 capsules Follow up time period per chart: 04/26/2020

## 2021-04-11 ENCOUNTER — Other Ambulatory Visit: Payer: Self-pay | Admitting: Registered Nurse

## 2021-04-11 DIAGNOSIS — B3731 Acute candidiasis of vulva and vagina: Secondary | ICD-10-CM

## 2021-04-11 MED ORDER — FLUCONAZOLE 150 MG PO TABS
150.0000 mg | ORAL_TABLET | Freq: Once | ORAL | 0 refills | Status: AC
Start: 2021-04-11 — End: 2021-04-11

## 2021-04-11 NOTE — Telephone Encounter (Signed)
Called patient to make her aware that the medication was sent to the pharmacy.

## 2021-04-15 ENCOUNTER — Other Ambulatory Visit: Payer: Self-pay

## 2021-04-15 ENCOUNTER — Ambulatory Visit (INDEPENDENT_AMBULATORY_CARE_PROVIDER_SITE_OTHER): Payer: No Typology Code available for payment source | Admitting: Pulmonary Disease

## 2021-04-15 ENCOUNTER — Encounter: Payer: Self-pay | Admitting: Pulmonary Disease

## 2021-04-15 VITALS — BP 110/70 | HR 77 | Temp 97.9°F | Ht 67.0 in | Wt 327.0 lb

## 2021-04-15 DIAGNOSIS — G4733 Obstructive sleep apnea (adult) (pediatric): Secondary | ICD-10-CM

## 2021-04-15 NOTE — Progress Notes (Signed)
Diana Garrison    [id number]    1970-09-06  Primary Care Physician:Morrow, Delfino Lovett, NP  Referring Physician: Maximiano Coss, NP [address] [place],  [place] [address]  Chief complaint:   Patient with history of snoring, daytime sleepiness  HPI:  Many years of snoring, no witnessed apneas, does have daytime sleepiness  Usually tries to go to bed between 9 and 10 Falls asleep easily 3-4 awakenings Final wake up time about 6 AM  Weight is up about 20 pounds but fluctuates  She is an active smoker, trying to cut down, currently smoking about half a pack a day  Admits to dryness of the mouth in the morning, no choking episodes, no night sweats, memory is good  Has a brother who uses a CPAP  Usually does not need to take a nap during the day but, if she falls asleep he sleeps about an hour   No history of hypertension, TIA, no heart disease known to her  Outpatient Encounter Medications as of 04/15/2021  Medication Sig   acetaminophen (TYLENOL) 325 MG tablet Take 650 mg by mouth every 6 (six) hours as needed.   acetic acid-hydrocortisone (VOSOL-HC) OTIC solution Place 3 drops into the left ear 3 (three) times daily.   baclofen (LIORESAL) 10 MG tablet Take 1 tablet (10 mg total) by mouth 3 (three) times daily.   cyclobenzaprine (FLEXERIL) 10 MG tablet Take 0.5-1 tablets (5-10 mg total) by mouth 3 (three) times daily as needed for muscle spasms.   diphenoxylate-atropine (LOMOTIL) 2.5-0.025 MG tablet Take 1 tablet by mouth 4 (four) times daily as needed for diarrhea or loose stools.   fluticasone (FLONASE) 50 MCG/ACT nasal spray Place 2 sprays into both nostrils daily.   methocarbamol (ROBAXIN) 500 MG tablet Take 1 tablet (500 mg total) by mouth every 8 (eight) hours as needed for muscle spasms.   montelukast (SINGULAIR) 10 MG tablet TAKE 1 TABLET BY MOUTH EVERYDAY AT BEDTIME   naproxen (NAPROSYN) 500 MG tablet Take 1 tablet (500 mg total) by mouth 2 (two)  times daily with a meal.   rosuvastatin (CRESTOR) 20 MG tablet TAKE 1 TABLET BY MOUTH EVERY DAY   venlafaxine XR (EFFEXOR XR) 37.5 MG 24 hr capsule Take 1 capsule (37.5 mg total) by mouth daily with breakfast.   [DISCONTINUED] amoxicillin-clavulanate (AUGMENTIN) 875-125 MG tablet Take 1 tablet by mouth 2 (two) times daily. (Patient not taking: Reported on 04/15/2021)   [DISCONTINUED] benzonatate (TESSALON PERLES) 100 MG capsule Take 1 capsule (100 mg total) by mouth 3 (three) times daily as needed. (Patient not taking: Reported on 04/15/2021)   [DISCONTINUED] diclofenac (VOLTAREN) 75 MG EC tablet Take 1 tablet (75 mg total) by mouth 2 (two) times daily. (Patient not taking: Reported on 04/15/2021)   [DISCONTINUED] HYDROcodone-acetaminophen (NORCO) 10-325 MG tablet Take 1 tablet by mouth every 8 (eight) hours as needed for up to 5 doses. (Patient not taking: Reported on 04/15/2021)   [DISCONTINUED] ibuprofen (ADVIL) 800 MG tablet Take 1 tablet (800 mg total) by mouth every 8 (eight) hours as needed. (Patient not taking: Reported on 04/15/2021)   [DISCONTINUED] ofloxacin (FLOXIN OTIC) 0.3 % OTIC solution Place 5 drops into the left ear daily. (Patient not taking: Reported on 04/15/2021)   [DISCONTINUED] Semaglutide, 1 MG/DOSE, (OZEMPIC, 1 MG/DOSE,) 4 MG/3ML SOPN INJECT 1 MG ONCE A WEEK AS DIRECTED   [DISCONTINUED] Semaglutide-Weight Management 0.5 MG/0.5ML SOAJ Inject 0.5 mg into the skin once a  week.   No facility-administered encounter medications on file as of 04/15/2021.    Allergies as of 04/15/2021   (No Known Allergies)    Past Medical History:  Diagnosis Date   Hyperlipidemia    Phreesia 11/12/2019    Past Surgical History:  Procedure Laterality Date   CESAREAN SECTION     CESAREAN SECTION N/A    Phreesia 11/12/2019   GANGLION CYST EXCISION     x2    Family History  Problem Relation Age of Onset   Cirrhosis Father    Pulmonary embolism Sister    Breast cancer Neg Hx     Social  History   Socioeconomic History   Marital status: Married    Spouse name: Not on file   Number of children: 2   Years of education: Not on file   Highest education level: Not on file  Occupational History   Occupation: CNA    Comment: [employer]  Tobacco Use   Smoking status: Every Day    Packs/day: 0.25    Types: Cigarettes    Start date: 10/10/2007    Passive exposure: Never   Smokeless tobacco: Never   Tobacco comments:    10 cigs per day 04/14/21.   Vaping Use   Vaping Use: Never used  Substance and Sexual Activity   Alcohol use: Yes    Comment: 4 drinks a week - red wine   Drug use: Not Currently    Types: Marijuana   Sexual activity: Yes    Partners: Male    Birth control/protection: None, Condom    Comment: 10/04/14: Not sexually active x5 mo due to spouse having had a CVA  Other Topics Concern   Not on file  Social History Narrative   Lives with daughter       Seatbelt - 100%      Guns in home - no   Social Determinants of Health   Financial Resource Strain: Not on file  Food Insecurity: Not on file  Transportation Needs: Not on file  Physical Activity: Not on file  Stress: Not on file  Social Connections: Not on file  Intimate Partner Violence: Not on file    Review of Systems  Constitutional:  Positive for fatigue.  Psychiatric/Behavioral:  Positive for sleep disturbance.    Vitals:   04/15/21 1056  BP: 110/70  Pulse: 77  Temp: 97.9 F (36.6 C)  SpO2: 100%     Physical Exam Constitutional:      Appearance: She is obese.  HENT:     Head: Normocephalic.     Right Ear: Tympanic membrane normal.     Mouth/Throat:     Mouth: Mucous membranes are moist.  Eyes:     Pupils: Pupils are equal, round, and reactive to light.  Cardiovascular:     Rate and Rhythm: Normal rate and regular rhythm.     Heart sounds: No murmur heard.   No friction rub.  Pulmonary:     Effort: No respiratory distress.     Breath sounds: No stridor. No wheezing  or rhonchi.  Musculoskeletal:     Cervical back: No rigidity or tenderness.  Neurological:     Mental Status: She is alert.  Psychiatric:        Mood and Affect: Mood normal.   Results of the Epworth flowsheet 04/15/2021  Sitting and reading 2  Watching TV 3  Sitting, inactive in a public place (e.g. a theatre or a meeting) 0  As  a passenger in a car for an hour without a break 2  Lying down to rest in the afternoon when circumstances permit 2  Sitting and talking to someone 0  Sitting quietly after a lunch without alcohol 2  In a car, while stopped for a few minutes in traffic 0  Total score 11    Data Reviewed: No previous sleep studies  Assessment:  Excessive daytime sleepiness -Likely related to underlying untreated obstructive sleep apnea  Moderate to significant probability of significant obstructive sleep apnea  Obesity  Active smoker Actual smoking cessation counseling  Pathophysiology of sleep disordered breathing discussed with the patient  Treatment options for sleep disordered breathing discussed with the patient  Plan/Recommendations: Schedule patient for home sleep study  Encouraged to exercise on a regular basis  Encouraged to give [place] a call with any significant concerns  Smoking cessation encouraged  Risks of not treating sleep disordered breathing discussed   Tentative follow-up in 3 to 4 months  Sherrilyn Rist MD [facility] 04/15/2021, 11:01 AM  CC: Maximiano Coss, NP

## 2021-04-15 NOTE — Patient Instructions (Signed)
We will schedule him for home sleep study  Follow-up in 3 to 4 months  Continue aggressive weight loss measures  Call us with significant concerns  Sleep Apnea Sleep apnea affects breathing during sleep. It causes breathing to stop for 10 seconds or more, or to become shallow. People with sleep apnea usually snore loudly. It can also increase the risk of: Heart attack. Stroke. Being very overweight (obese). Diabetes. Heart failure. Irregular heartbeat. High blood pressure. The goal of treatment is to help you breathe normally again. What are the causes? The most common cause of this condition is a collapsed or blocked airway. There are three kinds of sleep apnea: Obstructive sleep apnea. This is caused by a blocked or collapsed airway. Central sleep apnea. This happens when the brain does not send the right signals to the muscles that control breathing. Mixed sleep apnea. This is a combination of obstructive and central sleep apnea. What increases the risk? Being overweight. Smoking. Having a small airway. Being older. Being female. Drinking alcohol. Taking medicines to calm yourself (sedatives or tranquilizers). Having family members with the condition. Having a tongue or tonsils that are larger than normal. What are the signs or symptoms? Trouble staying asleep. Loud snoring. Headaches in the morning. Waking up gasping. Dry mouth or sore throat in the morning. Being sleepy or tired during the day. If you are sleepy or tired during the day, you may also: Not be able to focus your mind (concentrate). Forget things. Get angry a lot and have mood swings. Feel sad (depressed). Have changes in your personality. Have less interest in sex, if you are female. Be unable to have an erection, if you are female. How is this treated?  Sleeping on your side. Using a medicine to get rid of mucus in your nose (decongestant). Avoiding the use of alcohol, medicines to help you relax,  or certain pain medicines (narcotics). Losing weight, if needed. Changing your diet. Quitting smoking. Using a machine to open your airway while you sleep, such as: An oral appliance. This is a mouthpiece that shifts your lower jaw forward. A CPAP device. This device blows air through a mask when you breathe out (exhale). An EPAP device. This has valves that you put in each nostril. A BIPAP device. This device blows air through a mask when you breathe in (inhale) and breathe out. Having surgery if other treatments do not work. Follow these instructions at home: Lifestyle Make changes that your doctor recommends. Eat a healthy diet. Lose weight if needed. Avoid alcohol, medicines to help you relax, and some pain medicines. Do not smoke or use any products that contain nicotine or tobacco. If you need help quitting, ask your doctor. General instructions Take over-the-counter and prescription medicines only as told by your doctor. If you were given a machine to use while you sleep, use it only as told by your doctor. If you are having surgery, make sure to tell your doctor you have sleep apnea. You may need to bring your device with you. Keep all follow-up visits. Contact a doctor if: The machine that you were given to use during sleep bothers you or does not seem to be working. You do not get better. You get worse. Get help right away if: Your chest hurts. You have trouble breathing in enough air. You have an uncomfortable feeling in your back, arms, or stomach. You have trouble talking. One side of your body feels weak. A part of your face is hanging  down. These symptoms may be an emergency. Get help right away. Call your local emergency services (911 in the [place] Do not wait to see if the symptoms will go away. Do not drive yourself to the hospital. Summary This condition affects breathing during sleep. The most common cause is a collapsed or blocked airway. The goal of  treatment is to help you breathe normally while you sleep. This information is not intended to replace advice given to you by your health care provider. Make sure you discuss any questions you have with your health care provider. Document Revised: 10/06/2020 Document Reviewed: 02/06/2020 Elsevier Patient Education  2022 [employer].

## 2021-04-26 ENCOUNTER — Encounter: Payer: 59 | Admitting: Registered Nurse

## 2021-05-11 ENCOUNTER — Ambulatory Visit: Payer: No Typology Code available for payment source

## 2021-05-11 ENCOUNTER — Other Ambulatory Visit (HOSPITAL_BASED_OUTPATIENT_CLINIC_OR_DEPARTMENT_OTHER): Payer: Self-pay

## 2021-05-11 ENCOUNTER — Ambulatory Visit (INDEPENDENT_AMBULATORY_CARE_PROVIDER_SITE_OTHER): Payer: No Typology Code available for payment source | Admitting: Registered Nurse

## 2021-05-11 ENCOUNTER — Other Ambulatory Visit: Payer: Self-pay

## 2021-05-11 ENCOUNTER — Encounter: Payer: Self-pay | Admitting: Registered Nurse

## 2021-05-11 ENCOUNTER — Encounter (HOSPITAL_BASED_OUTPATIENT_CLINIC_OR_DEPARTMENT_OTHER): Payer: Self-pay | Admitting: Pharmacist

## 2021-05-11 VITALS — BP 114/82 | HR 84 | Temp 97.7°F | Resp 16 | Ht 67.0 in | Wt 329.6 lb

## 2021-05-11 DIAGNOSIS — Z1329 Encounter for screening for other suspected endocrine disorder: Secondary | ICD-10-CM

## 2021-05-11 DIAGNOSIS — Z13220 Encounter for screening for lipoid disorders: Secondary | ICD-10-CM

## 2021-05-11 DIAGNOSIS — Z13228 Encounter for screening for other metabolic disorders: Secondary | ICD-10-CM

## 2021-05-11 DIAGNOSIS — F331 Major depressive disorder, recurrent, moderate: Secondary | ICD-10-CM

## 2021-05-11 DIAGNOSIS — Z0000 Encounter for general adult medical examination without abnormal findings: Secondary | ICD-10-CM | POA: Diagnosis not present

## 2021-05-11 DIAGNOSIS — G4733 Obstructive sleep apnea (adult) (pediatric): Secondary | ICD-10-CM

## 2021-05-11 DIAGNOSIS — Z130 Encounter for screening for diseases of the blood and blood-forming organs and certain disorders involving the immune mechanism: Secondary | ICD-10-CM | POA: Diagnosis not present

## 2021-05-11 DIAGNOSIS — Z6841 Body Mass Index (BMI) 40.0 and over, adult: Secondary | ICD-10-CM

## 2021-05-11 DIAGNOSIS — M545 Low back pain, unspecified: Secondary | ICD-10-CM

## 2021-05-11 LAB — COMPREHENSIVE METABOLIC PANEL
ALT: 21 U/L (ref 0–35)
AST: 17 U/L (ref 0–37)
Albumin: 4 g/dL (ref 3.5–5.2)
Alkaline Phosphatase: 95 U/L (ref 39–117)
BUN: 9 mg/dL (ref 6–23)
CO2: 26 mEq/L (ref 19–32)
Calcium: 9.3 mg/dL (ref 8.4–10.5)
Chloride: 104 mEq/L (ref 96–112)
Creatinine, Ser: 0.84 mg/dL (ref 0.40–1.20)
GFR: 81.12 mL/min (ref >60.00)
Glucose, Bld: 100 mg/dL — ABNORMAL HIGH (ref 70–99)
Potassium: 4.3 mEq/L (ref 3.5–5.1)
Sodium: 137 mEq/L (ref 135–145)
Total Bilirubin: 0.4 mg/dL (ref 0.2–1.2)
Total Protein: 6.6 g/dL (ref 6.0–8.3)

## 2021-05-11 LAB — CBC WITH DIFFERENTIAL/PLATELET
Basophils Absolute: 0.1 10*3/uL (ref 0.0–0.1)
Basophils Relative: 1.3 % (ref 0.0–3.0)
Eosinophils Absolute: 0.1 10*3/uL (ref 0.0–0.7)
Eosinophils Relative: 1.9 % (ref 0.0–5.0)
HCT: 40.3 % (ref 36.0–46.0)
Hemoglobin: 13 g/dL (ref 12.0–15.0)
Lymphocytes Relative: 30.9 % (ref 12.0–46.0)
Lymphs Abs: 2.4 10*3/uL (ref 0.7–4.0)
MCHC: 32.2 g/dL (ref 30.0–36.0)
MCV: 91.1 fl (ref 78.0–100.0)
Monocytes Absolute: 0.4 10*3/uL (ref 0.1–1.0)
Monocytes Relative: 5.4 % (ref 3.0–12.0)
Neutro Abs: 4.6 10*3/uL (ref 1.4–7.7)
Neutrophils Relative %: 60.5 % (ref 43.0–77.0)
Platelets: 305 10*3/uL (ref 150.0–400.0)
RBC: 4.43 Mil/uL (ref 3.87–5.11)
RDW: 16.5 % — ABNORMAL HIGH (ref 11.5–15.5)
WBC: 7.6 10*3/uL (ref 4.0–10.5)

## 2021-05-11 LAB — HEMOGLOBIN A1C: Hgb A1c MFr Bld: 6.2 % (ref 4.6–6.5)

## 2021-05-11 LAB — LIPID PANEL
Cholesterol: 291 mg/dL — ABNORMAL HIGH (ref 0–200)
HDL: 39.6 mg/dL (ref >39.00)
NonHDL: 250.96
Total CHOL/HDL Ratio: 7
Triglycerides: 218 mg/dL — ABNORMAL HIGH (ref 0.0–149.0)
VLDL: 43.6 mg/dL — ABNORMAL HIGH (ref 0.0–40.0)

## 2021-05-11 LAB — TSH: TSH: 1.77 u[IU]/mL (ref 0.35–5.50)

## 2021-05-11 LAB — LDL CHOLESTEROL, DIRECT: Direct LDL: 204 mg/dL

## 2021-05-11 MED ORDER — CYCLOBENZAPRINE HCL 10 MG PO TABS
5.0000 mg | ORAL_TABLET | Freq: Three times a day (TID) | ORAL | 0 refills | Status: DC | PRN
  Filled 2021-05-11: qty 30, 10d supply, fill #0

## 2021-05-11 MED ORDER — VENLAFAXINE HCL ER 37.5 MG PO CP24
37.5000 mg | ORAL_CAPSULE | Freq: Every day | ORAL | 0 refills | Status: DC
  Filled 2021-05-11: qty 90, 90d supply, fill #0

## 2021-05-11 MED ORDER — NAPROXEN 500 MG PO TABS
500.0000 mg | ORAL_TABLET | Freq: Two times a day (BID) | ORAL | 0 refills | Status: DC
  Filled 2021-05-11: qty 30, 15d supply, fill #0

## 2021-05-11 MED ORDER — SEMAGLUTIDE (1 MG/DOSE) 4 MG/3ML ~~LOC~~ SOPN
1.0000 mg | PEN_INJECTOR | SUBCUTANEOUS | 0 refills | Status: DC
Start: 2021-05-11 — End: 2021-08-16
  Filled 2021-05-11: qty 9, 84d supply, fill #0

## 2021-05-11 MED ORDER — SEMAGLUTIDE(0.25 OR 0.5MG/DOS) 2 MG/1.5ML ~~LOC~~ SOPN
0.5000 mg | PEN_INJECTOR | SUBCUTANEOUS | 0 refills | Status: DC
  Filled 2021-05-11: qty 1.5, 28d supply, fill #0

## 2021-05-11 NOTE — Patient Instructions (Addendum)
Diana Garrison -  ? ?Great to see you! ? ?Start semaglutide weekly. Increase dose after 1 mo if tolerating well ? ?See you in 3 mo to recheck ? ?I'll let you know how labs look today ? ?Thank you ? ?Rich  ?

## 2021-05-11 NOTE — Addendum Note (Signed)
Addended by: Janeece Agee on: 05/11/2021 08:42 AM ? ? Modules accepted: Orders ? ?

## 2021-05-11 NOTE — Progress Notes (Signed)
Established Patient Office Visit  Subjective:  Patient ID: Diana Garrison, female    DOB: March 01, 1971  Age: 51 y.o. MRN: [id number]  CC:  Chief Complaint  Patient presents with   Annual Exam    HPI Diana Garrison presents for CPE  No acute concerns  Histories reviewed and updated with patient.   She is taking venlafaxine 37.5mg  XR po qd for depression Good effect, no AE  Takes rosuvastatin 20mg  po qd for hld - admits poor compliance. Lab Results  Component Value Date   CHOL 227 (H) 11/14/2019   HDL 50 11/14/2019   LDLCALC 160 (H) 11/14/2019   TRIG 93 11/14/2019   CHOLHDL 4.5 (H) 11/14/2019   Weight management Has been in discussions for semaglutide with me in the past  Now on [employer]. Would like to start now that coverage will likely be better.   Past Medical History:  Diagnosis Date   Hyperlipidemia    [employer] 11/12/2019    Past Surgical History:  Procedure Laterality Date   CESAREAN SECTION     CESAREAN SECTION N/A    [employer]reesia 11/12/2019   GANGLION CYST EXCISION     x2    Family History  Problem Relation Age of Onset   Cirrhosis Father    Pulmonary embolism Sister    Breast cancer Neg Hx     Social History   Socioeconomic History   Marital status: Married    Spouse name: Not on file   Number of children: 2   Years of education: Not on file   Highest education level: Not on file  Occupational History   Occupation: CNA    Comment: [employer]  Tobacco Use   Smoking status: Every Day    Packs/day: 0.25    Types: Cigarettes    Start date: 10/10/2007    Passive exposure: Never   Smokeless tobacco: Never   Tobacco comments:    10 cigs per day 04/14/21.   Vaping Use   Vaping Use: Never used  Substance and Sexual Activity   Alcohol use: Yes    Comment: 4 drinks a week - red wine   Drug use: Not Currently    Types: Marijuana   Sexual activity: Yes    Partners: Male    Birth control/protection: None, Condom     Comment: 10/04/14: Not sexually active x5 mo due to spouse having had a CVA  Other Topics Concern   Not on file  Social History Narrative   Lives with daughter       Seatbelt - 100%      Guns in home - no   Social Determinants of Health   Financial Resource Strain: Not on file  Food Insecurity: Not on file  Transportation Needs: Not on file  Physical Activity: Not on file  Stress: Not on file  Social Connections: Not on file  Intimate Partner Violence: Not on file    Outpatient Medications Prior to Visit  Medication Sig Dispense Refill   cyclobenzaprine (FLEXERIL) 10 MG tablet Take 0.5-1 tablets (5-10 mg total) by mouth 3 (three) times daily as needed for muscle spasms. 30 tablet 0   diphenoxylate-atropine (LOMOTIL) 2.5-0.025 MG tablet Take 1 tablet by mouth 4 (four) times daily as needed for diarrhea or loose stools. 30 tablet 0   fluticasone (FLONASE) 50 MCG/ACT nasal spray Place 2 sprays into both nostrils daily. 16 g 6   methocarbamol (ROBAXIN) 500 MG tablet Take 1 tablet (500 mg total)  by mouth every 8 (eight) hours as needed for muscle spasms. 30 tablet 1   montelukast (SINGULAIR) 10 MG tablet TAKE 1 TABLET BY MOUTH EVERYDAY AT BEDTIME 90 tablet 1   naproxen (NAPROSYN) 500 MG tablet Take 1 tablet (500 mg total) by mouth 2 (two) times daily with a meal. 30 tablet 0   venlafaxine XR (EFFEXOR XR) 37.5 MG 24 hr capsule Take 1 capsule (37.5 mg total) by mouth daily with breakfast. 90 capsule 0   acetaminophen (TYLENOL) 325 MG tablet Take 650 mg by mouth every 6 (six) hours as needed. (Patient not taking: Reported on 05/11/2021)     acetic acid-hydrocortisone (VOSOL-HC) OTIC solution Place 3 drops into the left ear 3 (three) times daily. (Patient not taking: Reported on 05/11/2021) 10 mL 0   baclofen (LIORESAL) 10 MG tablet Take 1 tablet (10 mg total) by mouth 3 (three) times daily. (Patient not taking: Reported on 05/11/2021) 30 each 0   rosuvastatin (CRESTOR) 20 MG tablet TAKE 1 TABLET BY  MOUTH EVERY DAY (Patient not taking: Reported on 05/11/2021) 90 tablet 3   No facility-administered medications prior to visit.    No Known Allergies  ROS Review of Systems  Constitutional: Negative.   HENT: Negative.    Eyes: Negative.   Respiratory: Negative.    Cardiovascular: Negative.   Gastrointestinal: Negative.   Genitourinary: Negative.   Musculoskeletal: Negative.   Skin: Negative.   Neurological: Negative.   Psychiatric/Behavioral: Negative.    All other systems reviewed and are negative.    Objective:    Physical Exam Vitals and nursing note reviewed.  Constitutional:      General: She is not in acute distress.    Appearance: Normal appearance. She is obese. She is not ill-appearing, toxic-appearing or diaphoretic.     Comments: Exam limited by body habitus  HENT:     Head: Normocephalic and atraumatic.     Right Ear: Tympanic membrane, ear canal and external ear normal. There is no impacted cerumen.     Left Ear: Tympanic membrane, ear canal and external ear normal. There is no impacted cerumen.     Nose: Nose normal. No congestion or rhinorrhea.     Mouth/Throat:     Mouth: Mucous membranes are moist.     Pharynx: Oropharynx is clear. No oropharyngeal exudate or posterior oropharyngeal erythema.  Eyes:     General: No scleral icterus.       Right eye: No discharge.        Left eye: No discharge.     Extraocular Movements: Extraocular movements intact.     Conjunctiva/sclera: Conjunctivae normal.     Pupils: Pupils are equal, round, and reactive to light.  Cardiovascular:     Rate and Rhythm: Normal rate and regular rhythm.     Pulses: Normal pulses.     Heart sounds: Normal heart sounds. No murmur heard.   No friction rub. No gallop.  Pulmonary:     Effort: Pulmonary effort is normal. No respiratory distress.     Breath sounds: Normal breath sounds. No stridor. No wheezing, rhonchi or rales.  Chest:     Chest wall: No tenderness.  Abdominal:      General: Abdomen is flat. Bowel sounds are normal. There is no distension.     Palpations: Abdomen is soft. There is no mass.     Tenderness: There is no abdominal tenderness. There is no right CVA tenderness, left CVA tenderness, guarding or rebound.  Hernia: No hernia is present.  Musculoskeletal:        General: No swelling, tenderness, deformity or signs of injury. Normal range of motion.     Right lower leg: No edema.     Left lower leg: No edema.  Skin:    General: Skin is warm and dry.     Capillary Refill: Capillary refill takes less than 2 seconds.     Coloration: Skin is not jaundiced or pale.     Findings: No bruising, erythema, lesion or rash.  Neurological:     General: No focal deficit present.     Mental Status: She is alert and oriented to person, place, and time. Mental status is at baseline.     Cranial Nerves: No cranial nerve deficit.     Sensory: No sensory deficit.     Motor: No weakness.     Coordination: Coordination normal.     Gait: Gait normal.     Deep Tendon Reflexes: Reflexes normal.  Psychiatric:        Mood and Affect: Mood normal.        Behavior: Behavior normal.        Thought Content: Thought content normal.        Judgment: Judgment normal.    BP 114/82    Pulse 84    Temp 97.7 F (36.5 C)    Resp 16    Ht 5\' 7"  (1.702 m)    Wt (!) 329 lb 9.6 oz (149.5 kg)    SpO2 99%    BMI 51.62 kg/m  Wt Readings from Last 3 Encounters:  05/11/21 (!) 329 lb 9.6 oz (149.5 kg)  04/15/21 (!) 327 lb (148.3 kg)  03/15/21 (!) 326 lb 6.4 oz (148.1 kg)     Health Maintenance Due  Topic Date Due   COVID-19 Vaccine (4 - Booster for Moderna series) 05/10/2020   Zoster Vaccines- Shingrix (1 of 2) Never done    There are no preventive care reminders to display for this patient.  Lab Results  Component Value Date   TSH 0.652 11/14/2019   Lab Results  Component Value Date   WBC 10.9 (H) 11/14/2019   HGB 13.7 11/14/2019   HCT 43.8 11/14/2019   MCV 94  11/14/2019   PLT 276 11/06/2018   Lab Results  Component Value Date   NA 139 09/09/2020   K 4.4 09/09/2020   CO2 27 09/09/2020   GLUCOSE 89 09/09/2020   BUN 8 09/09/2020   CREATININE 0.78 09/09/2020   BILITOT <0.2 11/14/2019   ALKPHOS 109 11/14/2019   AST 17 11/14/2019   ALT 21 11/14/2019   PROT 6.5 11/14/2019   ALBUMIN 4.0 11/14/2019   CALCIUM 9.4 09/09/2020   GFR 89.09 09/09/2020   Lab Results  Component Value Date   CHOL 227 (H) 11/14/2019   Lab Results  Component Value Date   HDL 50 11/14/2019   Lab Results  Component Value Date   LDLCALC 160 (H) 11/14/2019   Lab Results  Component Value Date   TRIG 93 11/14/2019   Lab Results  Component Value Date   CHOLHDL 4.5 (H) 11/14/2019   Lab Results  Component Value Date   HGBA1C 6.1 09/09/2020      Assessment & Plan:   Problem List Items Addressed This Visit   None Visit Diagnoses     Annual physical exam    -  Primary   Screening for endocrine, metabolic and immunity disorder  Relevant Orders   CBC with Differential/Platelet   Hemoglobin A1c   Comprehensive metabolic panel   TSH   Lipid screening       Relevant Orders   Lipid panel   Morbid obesity with BMI of 50.0-59.9, adult (HCC)       Relevant Medications   Semaglutide,0.25 or 0.5MG /DOS, 2 MG/1.5ML SOPN   Semaglutide, 1 MG/DOSE, 4 MG/3ML SOPN       Meds ordered this encounter  Medications   Semaglutide,0.25 or 0.5MG /DOS, 2 MG/1.5ML SOPN    Sig: Inject 0.5 mg into the skin once a week.    Dispense:  1.5 mL    Refill:  0    Order Specific Question:   Supervising Provider    Answer:   Carlota Raspberry, JEFFREY R [[id number]]   Semaglutide, 1 MG/DOSE, 4 MG/3ML SOPN    Sig: Inject 1 mg as directed once a week.    Dispense:  9 mL    Refill:  0    Order Specific Question:   Supervising Provider    Answer:   Carlota Raspberry, JEFFREY R [id number]    Follow-up: Return in about 3 months (around 08/11/2021) for weight/semaglutide.   PLAN Exam unremarkable, limited  by body habitus Start semaglutide weekly with dose increase after 1 mo if tolerating well. Return in 3 mo for med check Labs collected. Will follow up with the patient as warranted. Patient encouraged to call clinic with any questions, comments, or concerns.  Maximiano Coss, NP

## 2021-05-12 ENCOUNTER — Encounter: Payer: Self-pay | Admitting: Registered Nurse

## 2021-05-13 ENCOUNTER — Other Ambulatory Visit (HOSPITAL_BASED_OUTPATIENT_CLINIC_OR_DEPARTMENT_OTHER): Payer: Self-pay

## 2021-05-15 NOTE — Telephone Encounter (Signed)
All right, I?ll take the rover statin so don?t take it at daytime take it at night. Will you call that prescription into the med the new pharmacy that we picked? ?  ? ?

## 2021-05-16 ENCOUNTER — Encounter: Payer: Self-pay | Admitting: Registered Nurse

## 2021-05-16 ENCOUNTER — Other Ambulatory Visit: Payer: Self-pay | Admitting: Registered Nurse

## 2021-05-16 ENCOUNTER — Other Ambulatory Visit (HOSPITAL_BASED_OUTPATIENT_CLINIC_OR_DEPARTMENT_OTHER): Payer: Self-pay

## 2021-05-16 DIAGNOSIS — E7800 Pure hypercholesterolemia, unspecified: Secondary | ICD-10-CM

## 2021-05-16 MED ORDER — ROSUVASTATIN CALCIUM 20 MG PO TABS
20.0000 mg | ORAL_TABLET | Freq: Every day | ORAL | 3 refills | Status: DC
  Filled 2021-05-17 – 2021-08-10 (×2): qty 90, 90d supply, fill #0

## 2021-05-16 NOTE — Telephone Encounter (Signed)
Pt called about an order that needs to be sent in by Janeece Agee, she states the med is for her cholesterol, she states the med is Crestor 20mg . She also want to remind of her shot. She said he knows what shot she is talking about.  ? ?Please advice ? ?Thank You ?

## 2021-05-16 NOTE — Telephone Encounter (Signed)
Patient's Prior Authorization for Semaglutide has been started . ? ? ? ?Pt called about an order that needs to be sent in by Janeece Agee, she states the med is for her cholesterol, she states the med is Crestor 20mg . She also want to remind of her shot. She said he knows what shot she is talking about.  ?  ?Please advice ?

## 2021-05-17 ENCOUNTER — Other Ambulatory Visit (HOSPITAL_BASED_OUTPATIENT_CLINIC_OR_DEPARTMENT_OTHER): Payer: Self-pay

## 2021-05-18 ENCOUNTER — Other Ambulatory Visit (HOSPITAL_BASED_OUTPATIENT_CLINIC_OR_DEPARTMENT_OTHER): Payer: Self-pay

## 2021-05-18 NOTE — Telephone Encounter (Signed)
Flexeril and Crestor has been sent to the pharmacy and waiting on determination for the Ozempic  ?

## 2021-05-19 ENCOUNTER — Other Ambulatory Visit (HOSPITAL_BASED_OUTPATIENT_CLINIC_OR_DEPARTMENT_OTHER): Payer: Self-pay

## 2021-05-20 ENCOUNTER — Other Ambulatory Visit (HOSPITAL_BASED_OUTPATIENT_CLINIC_OR_DEPARTMENT_OTHER): Payer: Self-pay

## 2021-05-25 ENCOUNTER — Telehealth: Payer: Self-pay | Admitting: Pulmonary Disease

## 2021-05-25 DIAGNOSIS — G4733 Obstructive sleep apnea (adult) (pediatric): Secondary | ICD-10-CM

## 2021-05-25 NOTE — Telephone Encounter (Signed)
Call patient ? ?Sleep study result ? ?Date of study: ?05/11/2021 ? ?Impression: ?Moderately severe obstructive sleep apnea ?Moderate oxygen desaturations ? ?Recommendation: ?DME referral ? ?Recommend CPAP therapy for moderate obstructive sleep apnea ? ?Auto titrating CPAP with pressure settings of 5-20 will be appropriate ? ?Encourage weight loss measures ? ?Follow-up in the office 4 to 6 weeks following initiation of treatment ? ?

## 2021-05-27 NOTE — Telephone Encounter (Signed)
I called and spoke with the patient and she reports that she was given the report of her sleep study. She did not have any questions.  ?

## 2021-05-28 ENCOUNTER — Encounter: Payer: Self-pay | Admitting: Registered Nurse

## 2021-05-30 NOTE — Telephone Encounter (Signed)
Patient wants to know what medication to try next for weight loss since her insurance denied the shot. ?

## 2021-06-10 ENCOUNTER — Other Ambulatory Visit: Payer: Self-pay | Admitting: Registered Nurse

## 2021-06-10 DIAGNOSIS — F331 Major depressive disorder, recurrent, moderate: Secondary | ICD-10-CM

## 2021-06-10 NOTE — Telephone Encounter (Signed)
Patient wants a refill on Venlafaxine 37.5MG  but the prescription was originally discontinued on 05/11/2021  ?

## 2021-06-21 ENCOUNTER — Other Ambulatory Visit: Payer: Self-pay | Admitting: Registered Nurse

## 2021-06-21 DIAGNOSIS — F331 Major depressive disorder, recurrent, moderate: Secondary | ICD-10-CM

## 2021-06-21 DIAGNOSIS — M545 Low back pain, unspecified: Secondary | ICD-10-CM

## 2021-06-22 ENCOUNTER — Other Ambulatory Visit (HOSPITAL_BASED_OUTPATIENT_CLINIC_OR_DEPARTMENT_OTHER): Payer: Self-pay

## 2021-06-22 MED ORDER — NAPROXEN 500 MG PO TABS
500.0000 mg | ORAL_TABLET | Freq: Two times a day (BID) | ORAL | 0 refills | Status: DC
Start: 2021-06-22 — End: 2021-08-03
  Filled 2021-06-22: qty 30, 15d supply, fill #0

## 2021-06-22 MED ORDER — CYCLOBENZAPRINE HCL 10 MG PO TABS
5.0000 mg | ORAL_TABLET | Freq: Three times a day (TID) | ORAL | 0 refills | Status: DC | PRN
  Filled 2021-06-22: qty 30, 10d supply, fill #0

## 2021-07-26 ENCOUNTER — Telehealth: Payer: Self-pay

## 2021-07-26 NOTE — Telephone Encounter (Signed)
Error

## 2021-08-03 ENCOUNTER — Other Ambulatory Visit (HOSPITAL_BASED_OUTPATIENT_CLINIC_OR_DEPARTMENT_OTHER): Payer: Self-pay

## 2021-08-03 ENCOUNTER — Encounter: Payer: Self-pay | Admitting: Registered Nurse

## 2021-08-03 ENCOUNTER — Other Ambulatory Visit: Payer: Self-pay | Admitting: Registered Nurse

## 2021-08-03 DIAGNOSIS — F331 Major depressive disorder, recurrent, moderate: Secondary | ICD-10-CM

## 2021-08-03 DIAGNOSIS — M545 Low back pain, unspecified: Secondary | ICD-10-CM

## 2021-08-03 MED ORDER — NAPROXEN 500 MG PO TABS
500.0000 mg | ORAL_TABLET | Freq: Two times a day (BID) | ORAL | 0 refills | Status: DC
  Filled 2021-08-03: qty 30, 15d supply, fill #0

## 2021-08-03 MED ORDER — CYCLOBENZAPRINE HCL 10 MG PO TABS
5.0000 mg | ORAL_TABLET | Freq: Three times a day (TID) | ORAL | 0 refills | Status: DC | PRN
  Filled 2021-08-03: qty 30, 10d supply, fill #0

## 2021-08-10 ENCOUNTER — Other Ambulatory Visit (HOSPITAL_COMMUNITY): Payer: Self-pay

## 2021-08-16 ENCOUNTER — Encounter: Payer: Self-pay | Admitting: Registered Nurse

## 2021-08-16 ENCOUNTER — Other Ambulatory Visit (HOSPITAL_COMMUNITY): Payer: Self-pay

## 2021-08-16 ENCOUNTER — Ambulatory Visit
Admission: RE | Admit: 2021-08-16 | Discharge: 2021-08-16 | Disposition: A | Discharge status: Home / Self Care | Payer: No Typology Code available for payment source | Source: Ambulatory Visit | Attending: Registered Nurse | Admitting: Registered Nurse

## 2021-08-16 ENCOUNTER — Ambulatory Visit (INDEPENDENT_AMBULATORY_CARE_PROVIDER_SITE_OTHER): Payer: No Typology Code available for payment source | Admitting: Registered Nurse

## 2021-08-16 VITALS — BP 134/88 | HR 82 | Temp 98.1°F | Resp 16 | Ht 67.0 in | Wt 331.2 lb

## 2021-08-16 DIAGNOSIS — M25561 Pain in right knee: Secondary | ICD-10-CM

## 2021-08-16 DIAGNOSIS — F331 Major depressive disorder, recurrent, moderate: Secondary | ICD-10-CM

## 2021-08-16 DIAGNOSIS — Z6841 Body Mass Index (BMI) 40.0 and over, adult: Secondary | ICD-10-CM

## 2021-08-16 DIAGNOSIS — N951 Menopausal and female climacteric states: Secondary | ICD-10-CM

## 2021-08-16 DIAGNOSIS — S83206A Unspecified tear of unspecified meniscus, current injury, right knee, initial encounter: Secondary | ICD-10-CM | POA: Insufficient documentation

## 2021-08-16 MED ORDER — DICLOFENAC SODIUM 75 MG PO TBEC
75.0000 mg | DELAYED_RELEASE_TABLET | Freq: Two times a day (BID) | ORAL | 0 refills | Status: DC
  Filled 2021-08-16: qty 30, 15d supply, fill #0

## 2021-08-16 MED ORDER — SEMAGLUTIDE(0.25 OR 0.5MG/DOS) 2 MG/3ML ~~LOC~~ SOPN
0.5000 mg | PEN_INJECTOR | SUBCUTANEOUS | 0 refills | Status: DC
  Filled 2021-08-16 – 2021-09-28 (×2): qty 9, 84d supply, fill #0

## 2021-08-16 MED ORDER — VENLAFAXINE HCL ER 75 MG PO CP24
75.0000 mg | ORAL_CAPSULE | Freq: Every day | ORAL | 1 refills | Status: DC
  Filled 2021-08-16: qty 90, 90d supply, fill #0

## 2021-08-16 NOTE — Assessment & Plan Note (Signed)
Will increase venlafaxine to 75mg  po qd. Reviewed risks, benefits, and side effects, pt voices understanding.

## 2021-08-16 NOTE — Assessment & Plan Note (Signed)
Increase venlafaxine to 75mg  po qd Reviewed risks, benefits, and side effects, pt voices understanding.

## 2021-08-16 NOTE — Progress Notes (Signed)
Established Patient Office Visit  Subjective:  Patient ID: Diana Garrison, female    DOB: January 24, 1971  Age: 51 y.o. MRN: [id number]  CC:  Chief Complaint  Patient presents with   Weight Loss    3 months follow up. Pt states that her insurance wouldn't approve her medication.    HPI Diana Garrison presents for knee pain,   Knee pain Acute on chronic Fell when bowling. Landed on it then it slid back Has been taking OTC analgesics but pain persists.  Twisted when fell Mild swelling, no bruising, no radiation of pain. Unstable on stairs.   Amenorrhea.  No menses in 1 year.  Some hot flashes, night sweats.  Low libido. Has been on effexor 37.5mg  po qd, moderate effect.   Weight Management Has been consistently following a health diet and exercise program for over one year.  She has seen limited effect from this despite compliance throughout. She has been limited by a few acute injuries  Had tried phentermine in the past, AE of heart palpitations.  She has been prediabetic for some time - A1c in March 2023 was 6.2  Outpatient Medications Prior to Visit  Medication Sig Dispense Refill   cyclobenzaprine (FLEXERIL) 10 MG tablet Take 1/2-1 tablets (5-10 mg total) by mouth 3 (three) times daily as needed for muscle spasms. 30 tablet 0   fluticasone (FLONASE) 50 MCG/ACT nasal spray Place 2 sprays into both nostrils daily. 16 g 6   montelukast (SINGULAIR) 10 MG tablet TAKE 1 TABLET BY MOUTH EVERYDAY AT BEDTIME 90 tablet 1   rosuvastatin (CRESTOR) 20 MG tablet Take 1 tablet (20 mg total) by mouth daily. 90 tablet 3   naproxen (NAPROSYN) 500 MG tablet Take 1 tablet (500 mg total) by mouth 2 (two) times daily with a meal. 30 tablet 0   diphenoxylate-atropine (LOMOTIL) 2.5-0.025 MG tablet Take 1 tablet by mouth 4 (four) times daily as needed for diarrhea or loose stools. 30 tablet 0   Semaglutide, 1 MG/DOSE, 4 MG/3ML SOPN Inject 1 mg as directed once a week. (Patient not taking:  Reported on 08/16/2021) 9 mL 0   Semaglutide,0.25 or 0.5MG /DOS, 2 MG/1.5ML SOPN Inject 0.5 mg into the skin once a week. (Patient not taking: Reported on 08/16/2021) 1.5 mL 0   venlafaxine XR (EFFEXOR-XR) 37.5 MG 24 hr capsule TAKE 1 CAPSULE BY MOUTH DAILY WITH BREAKFAST. 90 capsule 0   No facility-administered medications prior to visit.    Review of Systems  Constitutional: Negative.   HENT: Negative.    Eyes: Negative.   Respiratory: Negative.    Cardiovascular: Negative.   Gastrointestinal: Negative.   Endocrine: Negative.   Genitourinary: Negative.   Musculoskeletal:  Positive for arthralgias.  Skin: Negative.   Allergic/Immunologic: Negative.   Neurological: Negative.   Hematological: Negative.   Psychiatric/Behavioral: Negative.    All other systems reviewed and are negative.    Objective:     BP 134/88   Pulse 82   Temp 98.1 F (36.7 C) (Temporal)   Resp 16   Ht 5\' 7"  (1.702 m)   Wt (!) 331 lb 3.2 oz (150.2 kg)   SpO2 98%   BMI 51.87 kg/m   Wt Readings from Last 3 Encounters:  08/16/21 (!) 331 lb 3.2 oz (150.2 kg)  05/11/21 (!) 329 lb 9.6 oz (149.5 kg)  04/15/21 (!) 327 lb (148.3 kg)   Physical Exam Vitals and nursing note reviewed.  Constitutional:      General: She is  not in acute distress.    Appearance: Normal appearance. She is obese. She is not ill-appearing, toxic-appearing or diaphoretic.  Cardiovascular:     Rate and Rhythm: Normal rate and regular rhythm.     Heart sounds: Normal heart sounds. No murmur heard.   No friction rub. No gallop.  Pulmonary:     Effort: Pulmonary effort is normal. No respiratory distress.     Breath sounds: Normal breath sounds. No stridor. No wheezing, rhonchi or rales.  Chest:     Chest wall: No tenderness.  Musculoskeletal:        General: Swelling, tenderness (medial R knee) and signs of injury present. No deformity. Normal range of motion.     Right lower leg: No edema.     Left lower leg: No edema.  Skin:     General: Skin is warm and dry.  Neurological:     General: No focal deficit present.     Mental Status: She is alert and oriented to person, place, and time. Mental status is at baseline.  Psychiatric:        Mood and Affect: Mood normal.        Behavior: Behavior normal.        Thought Content: Thought content normal.        Judgment: Judgment normal.    No results found for any visits on 08/16/21.    The 10-year ASCVD risk score (Arnett DK, et al., 2019) is: 10%    Assessment & Plan:   Problem List Items Addressed This Visit       Other   Morbid obesity with BMI of 40.0-44.9, adult (HCC)    Will seek coverage for semaglutide. I believe she'd benefit from this. Has not been on this before. Reviewed risks, benefits, and side effects, pt voices understanding.        Relevant Medications   Semaglutide,0.25 or 0.5MG /DOS, 2 MG/1.5ML SOPN   Moderate episode of recurrent major depressive disorder (HCC)    Increase venlafaxine to 75mg  po qd Reviewed risks, benefits, and side effects, pt voices understanding.        Relevant Medications   venlafaxine XR (EFFEXOR-XR) 75 MG 24 hr capsule   Acute pain of right knee - Primary    Diclofenac for pain. Will obtain xray. RICE method discussed Consider ortho referral given instability.        Relevant Medications   diclofenac (VOLTAREN) 75 MG EC tablet   Other Relevant Orders   DG Knee Complete 4 Views Right   Perimenopausal    Will increase venlafaxine to 75mg  po qd. Reviewed risks, benefits, and side effects, pt voices understanding.         Meds ordered this encounter  Medications   diclofenac (VOLTAREN) 75 MG EC tablet    Sig: Take 1 tablet by mouth 2 times daily.    Dispense:  30 tablet    Refill:  0    Order Specific Question:   Supervising Provider    Answer:   Neva SeatGREENE, JEFFREY R [[id number]]   Semaglutide,0.25 or 0.5MG /DOS, 2 MG/1.5ML SOPN    Sig: Inject 0.5 mg into the skin once a week.    Dispense:  4.5 mL     Refill:  0    Order Specific Question:   Supervising Provider    Answer:   Neva SeatGREENE, JEFFREY R [[id number]]   venlafaxine XR (EFFEXOR-XR) 75 MG 24 hr capsule    Sig: Take 1 capsule (75 mg total) by mouth daily  with breakfast.    Dispense:  90 capsule    Refill:  1    Order Specific Question:   Supervising Provider    Answer:   Neva Seat, JEFFREY R [[id number]]    Return in about 3 months (around 11/16/2021) for Chronic Conditions.    Janeece Agee, NP

## 2021-08-16 NOTE — Assessment & Plan Note (Signed)
Will seek coverage for semaglutide. I believe she'd benefit from this. Has not been on this before. Reviewed risks, benefits, and side effects, pt voices understanding.

## 2021-08-16 NOTE — Assessment & Plan Note (Signed)
Diclofenac for pain. Will obtain xray. RICE method discussed Consider ortho referral given instability.

## 2021-08-16 NOTE — Patient Instructions (Addendum)
Ms. Diana Garrison at [facility]. They take walk ins.  I have increased venlafaxine - take every single day.  Start semaglutide weekly.   Let's check in at the 3 mo mark  Thanks,  Denice Paradise

## 2021-08-19 ENCOUNTER — Telehealth: Payer: Self-pay | Admitting: Registered Nurse

## 2021-08-19 NOTE — Telephone Encounter (Signed)
Prior auth from [employer] came through for : Ozempic 0.25 pen injection - Auth form up front for MA to collect.

## 2021-08-20 ENCOUNTER — Other Ambulatory Visit (HOSPITAL_COMMUNITY): Payer: Self-pay

## 2021-08-24 ENCOUNTER — Encounter: Payer: Self-pay | Admitting: Registered Nurse

## 2021-08-24 NOTE — Telephone Encounter (Signed)
Do you have this PA form ?

## 2021-08-25 ENCOUNTER — Emergency Department (HOSPITAL_BASED_OUTPATIENT_CLINIC_OR_DEPARTMENT_OTHER)
Admission: EM | Admit: 2021-08-25 | Discharge: 2021-08-25 | Disposition: A | Discharge status: Home / Self Care | Payer: No Typology Code available for payment source | Attending: Emergency Medicine | Admitting: Emergency Medicine

## 2021-08-25 ENCOUNTER — Other Ambulatory Visit: Payer: Self-pay

## 2021-08-25 ENCOUNTER — Encounter (HOSPITAL_BASED_OUTPATIENT_CLINIC_OR_DEPARTMENT_OTHER): Payer: Self-pay | Admitting: Emergency Medicine

## 2021-08-25 ENCOUNTER — Emergency Department (HOSPITAL_BASED_OUTPATIENT_CLINIC_OR_DEPARTMENT_OTHER): Payer: No Typology Code available for payment source

## 2021-08-25 DIAGNOSIS — Y9354 Activity, bowling: Secondary | ICD-10-CM | POA: Insufficient documentation

## 2021-08-25 DIAGNOSIS — M238X1 Other internal derangements of right knee: Secondary | ICD-10-CM | POA: Insufficient documentation

## 2021-08-25 DIAGNOSIS — M25561 Pain in right knee: Secondary | ICD-10-CM | POA: Diagnosis present

## 2021-08-25 DIAGNOSIS — W1830XA Fall on same level, unspecified, initial encounter: Secondary | ICD-10-CM | POA: Insufficient documentation

## 2021-08-25 MED ORDER — OXYCODONE-ACETAMINOPHEN 5-325 MG PO TABS
1.0000 | ORAL_TABLET | Freq: Once | ORAL | Status: AC
  Administered 2021-08-25: 1 via ORAL
  Filled 2021-08-25: qty 1

## 2021-08-25 MED ORDER — OXYCODONE-ACETAMINOPHEN 5-325 MG PO TABS: 1.0000 | ORAL_TABLET | Freq: Four times a day (QID) | ORAL | 0 refills | Status: DC | PRN

## 2021-08-25 MED ORDER — IBUPROFEN 600 MG PO TABS: 600.0000 mg | ORAL_TABLET | Freq: Three times a day (TID) | ORAL | 0 refills | Status: DC | PRN

## 2021-08-25 NOTE — Discharge Instructions (Signed)
You were seen in the emergency department for right knee pain and instability.  Your x-ray did not show any obvious fracture or dislocation.  There is possibly a ligament injury and this will need follow-up with orthopedics.  Please contact [facility] for follow-up.  Knee immobilizer.  Continue ice and ibuprofen.

## 2021-08-25 NOTE — ED Notes (Signed)
XRAY at the Bedside. ?

## 2021-08-25 NOTE — ED Notes (Signed)
Reviewed AVS/discharge instruction with patient. Time allotted for and all questions answered. Patient is agreeable for d/c and escorted to ed exit by staff.  

## 2021-08-25 NOTE — ED Triage Notes (Signed)
Patient presents to ED via [facility] from home. Reports fall while bowling on May 5th. Here with continues right knee pain. Reports "it feels like it wants to bend backwards".

## 2021-08-25 NOTE — ED Provider Notes (Signed)
[facility] EMERGENCY DEPT Provider Note   CSN: [id number] Arrival date & time: 08/25/21  1035     History  Chief Complaint  Patient presents with   Knee Pain    Diana Garrison is a 51 y.o. female.  She is here with a complaint of right knee pain.  She said she fell while bowling ago, it did not hurt initially but more recently has become very painful and feels like it is going to give way.  Pain is primarily on the medial knee.  No numbness or weakness.  Has tried nothing for it.  Complaining of severe pain.  The history is provided by the patient.  Knee Pain Location:  Knee Time since incident:  1 month Injury: yes   Mechanism of injury: fall   Fall:    Impact surface:  Athletic surface Knee location:  R knee Pain details:    Quality:  Sharp and shooting   Severity:  Severe   Onset quality:  Gradual   Timing:  Intermittent   Progression:  Unchanged Chronicity:  New Prior injury to area:  No Relieved by:  Nothing Worsened by:  Bearing weight and activity Ineffective treatments:  Rest Associated symptoms: decreased ROM   Associated symptoms: no fever, no neck pain, no numbness and no tingling        Home Medications Prior to Admission medications   Medication Sig Start Date End Date Taking? Authorizing Provider  cyclobenzaprine (FLEXERIL) 10 MG tablet Take 1/2-1 tablets (5-10 mg total) by mouth 3 (three) times daily as needed for muscle spasms. 08/03/21   Janeece Agee, NP  diclofenac (VOLTAREN) 75 MG EC tablet Take 1 tablet by mouth 2 times daily. 08/16/21   Janeece Agee, NP  fluticasone (FLONASE) 50 MCG/ACT nasal spray Place 2 sprays into both nostrils daily. 03/30/21   Junie Spencer, FNP  montelukast (SINGULAIR) 10 MG tablet TAKE 1 TABLET BY MOUTH EVERYDAY AT BEDTIME 01/10/20   Janeece Agee, NP  rosuvastatin (CRESTOR) 20 MG tablet Take 1 tablet (20 mg total) by mouth daily. 05/16/21   Janeece Agee, NP  Semaglutide,0.25 or 0.5MG /DOS, 2  MG/3ML SOPN Inject 0.5 mg into the skin once a week. 08/16/21   Janeece Agee, NP  venlafaxine XR (EFFEXOR-XR) 75 MG 24 hr capsule Take 1 capsule by mouth daily with breakfast. 08/16/21   Janeece Agee, NP      Allergies    Patient has no known allergies.    Review of Systems   Review of Systems  Constitutional:  Negative for fever.  Musculoskeletal:  Positive for gait problem. Negative for neck pain.  Neurological:  Negative for weakness and numbness.    Physical Exam Updated Vital Signs BP 116/87 (BP Location: Left Arm)   Pulse 78   Temp 97.7 F (36.5 C)   Resp 20   Ht 5\' 7"  (1.702 m)   Wt 136.1 kg   SpO2 100%   BMI 46.99 kg/m  Physical Exam Vitals and nursing note reviewed.  Constitutional:      Appearance: Normal appearance. She is well-developed.  HENT:     Head: Normocephalic and atraumatic.  Eyes:     Conjunctiva/sclera: Conjunctivae normal.  Musculoskeletal:        General: Tenderness present. No deformity.     Cervical back: Neck supple.     Right lower leg: No edema.     Left lower leg: No edema.     Comments: Patient's right knee no patellar tenderness.  She  has significant medial joint line tenderness and pain with valgus stress.  No anterior posterior drawer.  Calf is supple without cords.  Hip and ankle nontender.  Distal pulses intact.  Skin:    General: Skin is warm and dry.     Capillary Refill: Capillary refill takes less than 2 seconds.  Neurological:     General: No focal deficit present.     Mental Status: She is alert.     GCS: GCS eye subscore is 4. GCS verbal subscore is 5. GCS motor subscore is 6.     Sensory: No sensory deficit.     Motor: No weakness.     ED Results / Procedures / Treatments   Labs (all labs ordered are listed, but only abnormal results are displayed) Labs Reviewed - No data to display  EKG None  Radiology DG Knee Right Port  Result Date: 08/25/2021 CLINICAL DATA:  Trauma, fall EXAM: PORTABLE RIGHT KNEE - 1-2  VIEW COMPARISON:  08/16/2021 FINDINGS: No recent fracture or dislocation is seen. There is no significant effusion in the suprapatellar bursa. 3 mm smooth marginated calcification seen in the soft tissues superior to the patella may be residual from previous soft tissue injury. This finding has not changed. IMPRESSION: No fracture or dislocation is seen in the right knee. Electronically Signed   By: Ernie Avena M.D.   On: 08/25/2021 11:20    Procedures Procedures    Medications Ordered in ED Medications  oxyCODONE-acetaminophen (PERCOCET/ROXICET) 5-325 MG per tablet 1 tablet (has no administration in time range)    ED Course/ Medical Decision Making/ A&P                           Medical Decision Making Amount and/or Complexity of Data Reviewed Radiology: ordered.  Risk Prescription drug management.   Differential diagnosis includes rupture, dislocation, sprain, ligamentous injury.  X-rays ordered interpreted by me as no acute fracture or dislocation.  Clinically she has a MCL injury.  Will place in knee immobilizer and have follow-up with orthopedics.  Short course of pain medicine and NSAIDs.  Return instructions discussed        Final Clinical Impression(s) / ED Diagnoses Final diagnoses:  Derangement of right knee ligament    Rx / DC Orders ED Discharge Orders          Ordered    ibuprofen (ADVIL) 600 MG tablet  Every 8 hours PRN        08/25/21 1241    oxyCODONE-acetaminophen (PERCOCET/ROXICET) 5-325 MG tablet  Every 6 hours PRN        08/25/21 1241              Terrilee Files, MD 08/25/21 1754

## 2021-08-26 ENCOUNTER — Other Ambulatory Visit (HOSPITAL_COMMUNITY): Payer: Self-pay

## 2021-08-26 ENCOUNTER — Other Ambulatory Visit: Payer: Self-pay | Admitting: Registered Nurse

## 2021-08-26 DIAGNOSIS — F331 Major depressive disorder, recurrent, moderate: Secondary | ICD-10-CM

## 2021-08-26 DIAGNOSIS — M25561 Pain in right knee: Secondary | ICD-10-CM

## 2021-08-26 MED ORDER — TRAMADOL HCL 50 MG PO TABS
ORAL_TABLET | ORAL | 0 refills | Status: DC
  Filled 2021-08-26: qty 10, 4d supply, fill #0

## 2021-08-30 ENCOUNTER — Other Ambulatory Visit (HOSPITAL_COMMUNITY): Payer: Self-pay

## 2021-09-07 ENCOUNTER — Encounter: Payer: Self-pay | Admitting: Registered Nurse

## 2021-09-07 NOTE — Telephone Encounter (Signed)
Let's see if we can get her a VV to talk about this  Thanks,  Luan Pulling

## 2021-09-07 NOTE — Telephone Encounter (Signed)
Spoke to pt and scheduled her a video visit for July 20@ 950 am that was Richard's first availability

## 2021-09-28 ENCOUNTER — Other Ambulatory Visit (HOSPITAL_COMMUNITY): Payer: Self-pay

## 2021-09-28 ENCOUNTER — Other Ambulatory Visit: Payer: Self-pay | Admitting: Registered Nurse

## 2021-09-28 DIAGNOSIS — F331 Major depressive disorder, recurrent, moderate: Secondary | ICD-10-CM

## 2021-09-28 MED ORDER — CYCLOBENZAPRINE HCL 10 MG PO TABS
5.0000 mg | ORAL_TABLET | Freq: Three times a day (TID) | ORAL | 0 refills | Status: DC | PRN
  Filled 2021-09-28: qty 30, 10d supply, fill #0

## 2021-09-28 NOTE — Telephone Encounter (Signed)
Patient is requesting a refill of the following medications: Requested Prescriptions   Pending Prescriptions Disp Refills   cyclobenzaprine (FLEXERIL) 10 MG tablet 30 tablet 0    Sig: Take 1/2-1 tablets (5-10 mg total) by mouth 3 (three) times daily as needed for muscle spasms.    Date of patient request: 09/28/2021 Last office visit: 08/03/2021 Date of last refill: 30 Last refill amount: 08/03/2021 Follow up time period per chart: 09/29/2021

## 2021-09-29 ENCOUNTER — Other Ambulatory Visit (HOSPITAL_COMMUNITY): Payer: Self-pay | Admitting: Orthopedic Surgery

## 2021-09-29 ENCOUNTER — Other Ambulatory Visit: Payer: Self-pay | Admitting: Orthopedic Surgery

## 2021-09-29 ENCOUNTER — Telehealth: Payer: No Typology Code available for payment source | Admitting: Registered Nurse

## 2021-09-29 ENCOUNTER — Encounter: Payer: Self-pay | Admitting: Registered Nurse

## 2021-09-29 ENCOUNTER — Other Ambulatory Visit (HOSPITAL_COMMUNITY): Payer: Self-pay

## 2021-09-29 ENCOUNTER — Other Ambulatory Visit: Payer: Self-pay

## 2021-09-29 ENCOUNTER — Telehealth (INDEPENDENT_AMBULATORY_CARE_PROVIDER_SITE_OTHER): Payer: No Typology Code available for payment source | Admitting: Registered Nurse

## 2021-09-29 DIAGNOSIS — M25561 Pain in right knee: Secondary | ICD-10-CM

## 2021-09-29 DIAGNOSIS — Z6841 Body Mass Index (BMI) 40.0 and over, adult: Secondary | ICD-10-CM

## 2021-09-29 DIAGNOSIS — S83249A Other tear of medial meniscus, current injury, unspecified knee, initial encounter: Secondary | ICD-10-CM

## 2021-09-29 DIAGNOSIS — F331 Major depressive disorder, recurrent, moderate: Secondary | ICD-10-CM | POA: Diagnosis not present

## 2021-09-29 DIAGNOSIS — J3089 Other allergic rhinitis: Secondary | ICD-10-CM

## 2021-09-29 DIAGNOSIS — E7800 Pure hypercholesterolemia, unspecified: Secondary | ICD-10-CM | POA: Diagnosis not present

## 2021-09-29 MED ORDER — ROSUVASTATIN CALCIUM 20 MG PO TABS
20.0000 mg | ORAL_TABLET | Freq: Every day | ORAL | 3 refills | Status: DC
  Filled 2021-09-29 – 2021-11-10 (×2): qty 90, 90d supply, fill #0
  Filled 2022-03-15: qty 90, 90d supply, fill #1
  Filled 2022-06-12: qty 90, 90d supply, fill #2

## 2021-09-29 MED ORDER — VENLAFAXINE HCL ER 75 MG PO CP24
75.0000 mg | ORAL_CAPSULE | Freq: Every day | ORAL | 1 refills | Status: DC
  Filled 2021-09-29 – 2021-12-22 (×2): qty 90, 90d supply, fill #0
  Filled 2022-03-15: qty 90, 90d supply, fill #1

## 2021-09-29 MED ORDER — CYCLOBENZAPRINE HCL 10 MG PO TABS
5.0000 mg | ORAL_TABLET | Freq: Three times a day (TID) | ORAL | 0 refills | Status: DC | PRN
  Filled 2021-09-29 – 2021-11-25 (×2): qty 30, 10d supply, fill #0

## 2021-09-29 MED ORDER — FLUTICASONE PROPIONATE 50 MCG/ACT NA SUSP
2.0000 | Freq: Every day | NASAL | 6 refills | Status: DC
  Filled 2021-09-29: qty 16, 30d supply, fill #0

## 2021-09-29 MED ORDER — METHYLPREDNISOLONE 4 MG PO TBPK
ORAL_TABLET | ORAL | 0 refills | Status: DC
  Filled 2021-09-29: qty 21, 6d supply, fill #0

## 2021-09-29 MED ORDER — MELOXICAM 15 MG PO TABS
15.0000 mg | ORAL_TABLET | Freq: Every day | ORAL | 1 refills | Status: DC
  Filled 2021-09-29: qty 90, 90d supply, fill #0
  Filled 2021-12-22: qty 90, 90d supply, fill #1

## 2021-09-29 NOTE — Patient Instructions (Addendum)
Glenetta Hew, MD Herbie Drape, MD Jacquiline Doe, MD Edwina Barth, MD Letta Moynahan Early, NP Jiles Prows, DNP     If you have lab work done today you will be contacted with your lab results within the next 2 weeks.  If you have not heard from [place] then please contact us. The fastest way to get your results is to register for My Chart.   IF you received an x-ray today, you will receive an invoice from [facility] Radiology. Please contact [facility] at [phone] with questions or concerns regarding your invoice.   IF you received labwork today, you will receive an invoice from [place]. Please contact [facility] at [phone] with questions or concerns regarding your invoice.   Our billing staff will not be able to assist you with questions regarding bills from these companies.  You will be contacted with the lab results as soon as they are available. The fastest way to get your results is to activate your My Chart account. Instructions are located on the last page of this paperwork. If you have not heard from [place] regarding the results in 2 weeks, please contact this office.

## 2021-09-29 NOTE — Progress Notes (Signed)
Telemedicine Encounter- SOAP NOTE Established Patient  This telephone encounter was conducted with the patient's (or proxy's) verbal consent via audio telecommunications: yes/no: Yes Patient was instructed to have this encounter in a suitably private space; and to only have persons present to whom they give permission to participate. In addition, patient identity was confirmed by use of name plus two identifiers (DOB and address).  I discussed the limitations, risks, security and privacy concerns of performing an evaluation and management service by telephone and the availability of in person appointments. I also discussed with the patient that there may be a patient responsible charge related to this service. The patient expressed understanding and agreed to proceed.  I spent a total of 17 minute talking with the patient or their proxy.  Patient at home Provider in office  Participants: [name], NP and Donnie Coffin  Chief Complaint  Patient presents with   Medication Refill    Patient states she would like to discuss medication and discuss PCP leaving    Subjective   Diana Garrison is a 51 y.o. established patient. Telephone visit today for multiple concerns  HPI Weight Would like to be referred to healthy weight and wellness. We have tried to get GLP1s covered but she is not sure she wants to pursue this anymore. She does note that she has been pursuing diet and exercise for over 6 months at this time.  Knee pain She continues NSAID use for pain relief. Does have MRI coming up with ortho Would like refill on meloxicam and flexeril for pain management Uses prn.  Depression Improved on effexor 75mg  ER po qd. No AE. Does not feel that she wants to change at this time  HLD On statin daily. Plan to recheck within 3 months.  Lab Results  Component Value Date   CHOL 291 (H) 05/11/2021   HDL 39.60 05/11/2021   LDLCALC 160 (H) 11/14/2019   LDLDIRECT 204.0  05/11/2021   TRIG 218.0 (H) 05/11/2021   CHOLHDL 7 05/11/2021     Patient Active Problem List   Diagnosis Date Noted   Moderate episode of recurrent major depressive disorder (HCC) 08/16/2021   Acute pain of right knee 08/16/2021   Perimenopausal 08/16/2021   Supraspinatus tendinitis, left 03/25/2019   Plantar fasciitis 03/25/2019   Achilles tendinitis 01/28/2019   Spider veins of both lower extremities 10/17/2016   Back pain 09/04/2016   Menorrhagia 06/06/2013   Morbid obesity with BMI of 40.0-44.9, adult (HCC) 06/06/2013   Smoker 06/06/2013    Past Medical History:  Diagnosis Date   Hyperlipidemia    [employer] 11/12/2019    Current Outpatient Medications  Medication Sig Dispense Refill   ibuprofen (ADVIL) 600 MG tablet Take 1 tablet (600 mg total) by mouth every 8 (eight) hours as needed. 30 tablet 0   meloxicam (MOBIC) 15 MG tablet Take 1 tablet (15 mg total) by mouth daily. 90 tablet 1   montelukast (SINGULAIR) 10 MG tablet TAKE 1 TABLET BY MOUTH EVERYDAY AT BEDTIME 90 tablet 1   traMADol (ULTRAM) 50 MG tablet Take 1 tablet by mouth every eight hours as needed for pain 10 tablet 0   cyclobenzaprine (FLEXERIL) 10 MG tablet Take 1/2-1 tablets (5-10 mg total) by mouth 3 (three) times daily as needed for muscle spasms. 30 tablet 0   fluticasone (FLONASE) 50 MCG/ACT nasal spray Place 2 sprays into both nostrils daily. 16 g 6   methylPREDNISolone (MEDROL) 4 MG TBPK tablet Take as  directed on the pack 21 each 0   rosuvastatin (CRESTOR) 20 MG tablet Take 1 tablet (20 mg total) by mouth daily. 90 tablet 3   venlafaxine XR (EFFEXOR-XR) 75 MG 24 hr capsule Take 1 capsule by mouth daily with breakfast. 90 capsule 1   No current facility-administered medications for this visit.    No Known Allergies  Social History   Socioeconomic History   Marital status: Married    Spouse name: Not on file   Number of children: 2   Years of education: Not on file   Highest education level:  Not on file  Occupational History   Occupation: CNA    Comment: [employer]  Tobacco Use   Smoking status: Every Day    Packs/day: 0.25    Types: Cigarettes    Start date: 10/10/2007    Passive exposure: Never   Smokeless tobacco: Never   Tobacco comments:    10 cigs per day 04/14/21.   Vaping Use   Vaping Use: Never used  Substance and Sexual Activity   Alcohol use: Yes    Comment: 4 drinks a week - red wine   Drug use: Not Currently    Types: Marijuana   Sexual activity: Yes    Partners: Male    Birth control/protection: None, Condom    Comment: 10/04/14: Not sexually active x5 mo due to spouse having had a CVA  Other Topics Concern   Not on file  Social History Narrative   Lives with daughter       Seatbelt - 100%      Guns in home - no   Social Determinants of Health   Financial Resource Strain: Low Risk  (07/15/2018)   Overall Financial Resource Strain (CARDIA)    Difficulty of Paying Living Expenses: Not hard at all  Food Insecurity: No Food Insecurity (07/15/2018)   Hunger Vital Sign    Worried About Running Out of Food in the Last Year: Never true    Ran Out of Food in the Last Year: Never true  Transportation Needs: No Transportation Needs (07/15/2018)   PRAPARE - Administrator, Civil Service (Medical): No    Lack of Transportation (Non-Medical): No  Physical Activity: Sufficiently Active (07/15/2018)   Exercise Vital Sign    Days of Exercise per Week: 5 days    Minutes of Exercise per Session: 30 min  Stress: No Stress Concern Present (07/15/2018)   [employer] of Occupational Health - Occupational Stress Questionnaire    Feeling of Stress : Not at all  Social Connections: Moderately Isolated (07/15/2018)   Social Connection and Isolation Panel [NHANES]    Frequency of Communication with Friends and Family: Once a week    Frequency of Social Gatherings with Friends and Family: Once a week    Attends Religious Services: More than 4 times per year     Active Member of [employer] or Organizations: No    Attends Banker Meetings: Never    Marital Status: Divorced  Catering manager Violence: Not At Risk (07/15/2018)   Humiliation, Afraid, Rape, and Kick questionnaire    Fear of Current or Ex-Partner: No    Emotionally Abused: No    Physically Abused: No    Sexually Abused: No    ROS Per hpi   Objective   Vitals as reported by the patient: There were no vitals filed for this visit.  Karry was seen today for medication refill.  Diagnoses and all orders  for this visit:  Acute pain of right knee -     meloxicam (MOBIC) 15 MG tablet; Take 1 tablet (15 mg total) by mouth daily.  Moderate episode of recurrent major depressive disorder (HCC) -     cyclobenzaprine (FLEXERIL) 10 MG tablet; Take 1/2-1 tablets (5-10 mg total) by mouth 3 (three) times daily as needed for muscle spasms. -     venlafaxine XR (EFFEXOR-XR) 75 MG 24 hr capsule; Take 1 capsule by mouth daily with breakfast.  Elevated LDL cholesterol level -     rosuvastatin (CRESTOR) 20 MG tablet; Take 1 tablet (20 mg total) by mouth daily.  Morbid obesity with BMI of 40.0-44.9, adult (HCC) -     Amb Ref to Medical Weight Management  Seasonal allergic rhinitis due to other allergic trigger -     fluticasone (FLONASE) 50 MCG/ACT nasal spray; Place 2 sprays into both nostrils daily.    PLAN Refill meds as above She plants to establish with new provider in 3 mo Refer to medical weight management Patient encouraged to call clinic with any questions, comments, or concerns.   I discussed the assessment and treatment plan with the patient. The patient was provided an opportunity to ask questions and all were answered. The patient agreed with the plan and demonstrated an understanding of the instructions.   The patient was advised to call back or seek an in-person evaluation if the symptoms worsen or if the condition fails to improve as anticipated.  I provided  17 minutes of non-face-to-face time during this encounter.  Janeece Agee, NP

## 2021-09-30 ENCOUNTER — Other Ambulatory Visit (HOSPITAL_COMMUNITY): Payer: Self-pay

## 2021-09-30 ENCOUNTER — Telehealth: Payer: Self-pay | Admitting: Registered Nurse

## 2021-09-30 NOTE — Telephone Encounter (Signed)
Caller name: Clinical cytogeneticist   On DPR? :yes/no: Yes  Call back number:[phone]  Provider they see:  Janeece Agee   Reason for call:  pt states she wants Diclofenac gel for her knee not the pill . Pt pharmacy is [facility].

## 2021-10-03 ENCOUNTER — Other Ambulatory Visit: Payer: Self-pay | Admitting: Registered Nurse

## 2021-10-03 ENCOUNTER — Other Ambulatory Visit (HOSPITAL_COMMUNITY): Payer: Self-pay

## 2021-10-03 DIAGNOSIS — M25561 Pain in right knee: Secondary | ICD-10-CM

## 2021-10-03 MED ORDER — DICLOFENAC SODIUM 1 % EX GEL
2.0000 g | Freq: Four times a day (QID) | CUTANEOUS | 1 refills | Status: DC
  Filled 2021-10-03: qty 150, 30d supply, fill #0
  Filled 2021-10-05: qty 150, 19d supply, fill #0
  Filled 2021-11-02: qty 100, 12d supply, fill #0
  Filled 2021-11-25: qty 100, 12d supply, fill #1
  Filled 2021-12-13: qty 100, 12d supply, fill #2

## 2021-10-03 MED ORDER — DICLOFENAC SODIUM 1 % EX GEL: 2.0000 g | Freq: Four times a day (QID) | CUTANEOUS | 1 refills | Status: DC

## 2021-10-03 NOTE — Telephone Encounter (Signed)
Sent ? ?Thanks, ? ?Rich

## 2021-10-05 DIAGNOSIS — Z0289 Encounter for other administrative examinations: Secondary | ICD-10-CM

## 2021-10-06 ENCOUNTER — Other Ambulatory Visit (HOSPITAL_COMMUNITY): Payer: Self-pay

## 2021-10-18 ENCOUNTER — Ambulatory Visit (INDEPENDENT_AMBULATORY_CARE_PROVIDER_SITE_OTHER): Payer: No Typology Code available for payment source | Admitting: Family Medicine

## 2021-10-18 ENCOUNTER — Ambulatory Visit (HOSPITAL_COMMUNITY)
Admission: RE | Admit: 2021-10-18 | Discharge: 2021-10-18 | Disposition: A | Discharge status: Home / Self Care | Payer: No Typology Code available for payment source | Source: Ambulatory Visit | Attending: Orthopedic Surgery | Admitting: Orthopedic Surgery

## 2021-10-18 ENCOUNTER — Encounter (INDEPENDENT_AMBULATORY_CARE_PROVIDER_SITE_OTHER): Payer: Self-pay | Admitting: Family Medicine

## 2021-10-18 VITALS — BP 128/85 | HR 90 | Temp 98.3°F | Ht 66.0 in | Wt 335.0 lb

## 2021-10-18 DIAGNOSIS — S83249A Other tear of medial meniscus, current injury, unspecified knee, initial encounter: Secondary | ICD-10-CM | POA: Diagnosis present

## 2021-10-18 DIAGNOSIS — Z6841 Body Mass Index (BMI) 40.0 and over, adult: Secondary | ICD-10-CM

## 2021-10-18 DIAGNOSIS — R5383 Other fatigue: Secondary | ICD-10-CM | POA: Diagnosis not present

## 2021-10-18 DIAGNOSIS — F3289 Other specified depressive episodes: Secondary | ICD-10-CM | POA: Diagnosis not present

## 2021-10-18 DIAGNOSIS — E7849 Other hyperlipidemia: Secondary | ICD-10-CM | POA: Diagnosis not present

## 2021-10-18 DIAGNOSIS — E669 Obesity, unspecified: Secondary | ICD-10-CM

## 2021-10-18 DIAGNOSIS — N938 Other specified abnormal uterine and vaginal bleeding: Secondary | ICD-10-CM

## 2021-10-18 DIAGNOSIS — R7303 Prediabetes: Secondary | ICD-10-CM | POA: Diagnosis not present

## 2021-10-18 DIAGNOSIS — R0602 Shortness of breath: Secondary | ICD-10-CM

## 2021-10-19 ENCOUNTER — Encounter (INDEPENDENT_AMBULATORY_CARE_PROVIDER_SITE_OTHER): Payer: Self-pay

## 2021-10-20 ENCOUNTER — Encounter (INDEPENDENT_AMBULATORY_CARE_PROVIDER_SITE_OTHER): Payer: Self-pay | Admitting: Family Medicine

## 2021-10-20 DIAGNOSIS — E559 Vitamin D deficiency, unspecified: Secondary | ICD-10-CM | POA: Insufficient documentation

## 2021-10-20 DIAGNOSIS — R7303 Prediabetes: Secondary | ICD-10-CM | POA: Insufficient documentation

## 2021-10-24 LAB — HEMOGLOBIN A1C
Est. average glucose Bld gHb Est-mCnc: 123 mg/dL
Hgb A1c MFr Bld: 5.9 % — ABNORMAL HIGH (ref 4.8–5.6)

## 2021-10-24 LAB — VITAMIN B12: Vitamin B-12: 1000 pg/mL (ref 232–1245)

## 2021-10-24 LAB — INSULIN, FREE AND TOTAL
Free Insulin: 25 uU/mL — ABNORMAL HIGH
Total Insulin: 25 uU/mL

## 2021-10-24 LAB — VITAMIN D 25 HYDROXY (VIT D DEFICIENCY, FRACTURES): Vit D, 25-Hydroxy: 18.3 ng/mL — ABNORMAL LOW (ref 30.0–100.0)

## 2021-10-24 LAB — FERRITIN: Ferritin: 34 ng/mL (ref 15–150)

## 2021-10-25 NOTE — Progress Notes (Signed)
Chief Complaint:   OBESITY Diana Garrison (MR# [id number]) is a 51 y.o. female who presents for evaluation and treatment of obesity and related comorbidities. Current BMI is Body mass index is 54.07 kg/m. Diana Garrison has been struggling with her weight for many years and has been unsuccessful in either losing weight, maintaining weight loss, or reaching her healthy weight goal.  Diana Garrison has been overweight throughout adulthood.  She has a family history of obesity.  Sister died following RYGB surgery.  Has never used AOM.  Denies large portions.  Has actively tried to make better choices for 1 year, but did not lose weight.  Has a protein smoothie in the morning with fruit.  Has fruit at lunch.  Brings lunch or goes out.  Snacks on fruit in the afternoon.  Typically skips dinner, on purpose.  Craves sweets.  Family members by candy but hides it.  Diana Garrison is currently in the action stage of change and ready to dedicate time achieving and maintaining a healthier weight. Diana Garrison is interested in becoming our patient and working on intensive lifestyle modifications including (but not limited to) diet and exercise for weight loss.  Diana Garrison's habits were reviewed today and are as follows: Her family eats meals together, she thinks her family will eat healthier with her, her desired weight loss is 65 lbs, she started gaining weight in 2019, her heaviest weight ever was 332 pounds, she has significant food cravings issues, she snacks frequently in the evenings, she skips meals frequently, she is frequently drinking liquids with calories, she frequently makes poor food choices, she has problems with excessive hunger, and she struggles with emotional eating.  Depression Screen Diana Garrison's Food and Mood (modified PHQ-9) score was 17.     10/18/2021    8:17 AM  Depression screen PHQ 2/9  Decreased Interest 1  Down, Depressed, Hopeless 2  PHQ - 2 Score 3  Altered sleeping 3  Tired,  decreased energy 3  Change in appetite 2  Feeling bad or failure about yourself  2  Trouble concentrating 3  Moving slowly or fidgety/restless 1  Suicidal thoughts 0  PHQ-9 Score 17  Difficult doing work/chores Somewhat difficult   Subjective:   1. Other fatigue Diana Garrison admits to daytime somnolence and admits to waking up still tired. Patient has a history of symptoms of daytime fatigue and morning fatigue. Diana Garrison generally gets 7 hours of sleep per night, and states that she has nightime awakenings and generally restful sleep. Snoring is present. Apneic episodes are present. Epworth Sleepiness Score is 18.   2. SOB (shortness of breath) Diana Garrison notes increasing shortness of breath with exercising and seems to be worsening over time with weight gain. She notes getting out of breath sooner with activity than she used to. This has not gotten worse recently. Diana Garrison denies shortness of breath at rest or orthopnea.  3. Other hyperlipidemia Diana Garrison is on Crestor 20 mg daily.  Total cholesterol 291, LDL 204, triglycerides 218, and HDL 39 on 05/11/2021.  Denies myalgias or chest pain.  4. Prediabetes Diana Garrison's last A1c was 6.2 on 05/11/2021.  Never on metformin, and notes she occasionally drinks frappes and sweet tea.  5. Other depression Diana Garrison is on Effexor XR 75 mg daily per her PCP.  She notes it is helping and her symptoms are under good control.  Her PHQ-9 score is 18.  Tends to skip meals when depressed.  6. DUB (dysfunctional uterine bleeding) Diana Garrison's menses are spacing out, and  she used to have heavy menses.   Assessment/Plan:   1. Other fatigue Diana Garrison does feel that her weight is causing her energy to be lower than it should be. Fatigue may be related to obesity, depression or many other causes. Labs will be ordered, and in the meanwhile, Diana Garrison will focus on self care including making healthy food choices, increasing physical activity and focusing on stress  reduction.  - EKG 12-Lead - Hemoglobin A1c - VITAMIN D 25 Hydroxy (Vit-D Deficiency, Fractures) - Insulin, Free and Total - Vitamin B12 - Ferritin  2. SOB (shortness of breath) Diana Garrison does feel that she gets out of breath more easily that she used to when she exercises. Diana Garrison's shortness of breath appears to be obesity related and exercise induced. She has agreed to work on weight loss and gradually increase exercise to treat her exercise induced shortness of breath. Will continue to monitor closely.  3. Other hyperlipidemia Diana Garrison will continue Crestor milligrams daily, and work on her heart healthy diet.  4. Prediabetes We will check labs today, and we will follow-up at [facility] next visit.  5. Other depression Diana Garrison will continue Effexor 75 mg XR per her PCP.  She has a PCP visit coming up, and may need to increase her dose.  6. DUB (dysfunctional uterine bleeding) We will check labs today, and we will follow-up at [facility] next visit.  7. Obesity, current BMI 54.1 Diana Garrison is currently in the action stage of change and her goal is to continue with weight loss efforts. I recommend Diana Garrison begin the structured treatment plan as follows:  She has agreed to the Category 3 Plan.  Exercise goals: Bought resistance bands and kettle bells.  Behavioral modification strategies: increasing lean protein intake, increasing water intake, decreasing eating out, no skipping meals, meal planning and cooking strategies, better snacking choices, and decreasing junk food.  She was informed of the importance of frequent follow-up visits to maximize her success with intensive lifestyle modifications for her multiple health conditions. She was informed we would discuss her lab results at her next visit unless there is a critical issue that needs to be addressed sooner. Diana Garrison agreed to keep her next visit at the agreed upon time to discuss these results.  Objective:    Blood pressure 128/85, pulse 90, temperature 98.3 F (36.8 C), height 5\' 6"  (1.676 m), weight (!) 335 lb (152 kg), SpO2 99 %. Body mass index is 54.07 kg/m.  EKG: Normal sinus rhythm, rate 82 BPM.  Indirect Calorimeter completed today shows a VO2 of 321 and a REE of 2218.  Her calculated basal metabolic rate is 2219 thus her basal metabolic rate is better than expected.  General: Cooperative, alert, well developed, in no acute distress. HEENT: Conjunctivae and lids unremarkable. Cardiovascular: Regular rhythm.  Lungs: Normal work of breathing. Neurologic: No focal deficits.   Lab Results  Component Value Date   CREATININE 0.84 05/11/2021   BUN 9 05/11/2021   NA 137 05/11/2021   K 4.3 05/11/2021   CL 104 05/11/2021   CO2 26 05/11/2021   Lab Results  Component Value Date   ALT 21 05/11/2021   AST 17 05/11/2021   ALKPHOS 95 05/11/2021   BILITOT 0.4 05/11/2021   Lab Results  Component Value Date   HGBA1C 5.9 (H) 10/18/2021   HGBA1C 6.2 05/11/2021   HGBA1C 6.1 09/09/2020   HGBA1C 6.2 (H) 11/14/2019   HGBA1C 5.8 (H) 11/06/2018   No results found for: "INSULIN" Lab Results  Component  Value Date   TSH 1.77 05/11/2021   Lab Results  Component Value Date   CHOL 291 (H) 05/11/2021   HDL 39.60 05/11/2021   LDLCALC 160 (H) 11/14/2019   LDLDIRECT 204.0 05/11/2021   TRIG 218.0 (H) 05/11/2021   CHOLHDL 7 05/11/2021   Lab Results  Component Value Date   WBC 7.6 05/11/2021   HGB 13.0 05/11/2021   HCT 40.3 05/11/2021   MCV 91.1 05/11/2021   PLT 305.0 05/11/2021   Lab Results  Component Value Date   FERRITIN 34 10/18/2021   Attestation Statements:   Reviewed by clinician on day of visit: allergies, medications, problem list, medical history, surgical history, family history, social history, and previous encounter notes.   Trude Mcburney, am acting as transcriptionist for Seymour Bars, DO.  I have reviewed the above documentation for accuracy and completeness,  and I agree with the above. Glennis Brink, DO

## 2021-10-26 ENCOUNTER — Other Ambulatory Visit (HOSPITAL_COMMUNITY): Payer: Self-pay

## 2021-10-26 ENCOUNTER — Ambulatory Visit (INDEPENDENT_AMBULATORY_CARE_PROVIDER_SITE_OTHER): Payer: No Typology Code available for payment source | Admitting: Family Medicine

## 2021-10-26 ENCOUNTER — Encounter: Payer: Self-pay | Admitting: Family Medicine

## 2021-10-26 VITALS — BP 134/86 | HR 78 | Temp 97.0°F | Ht 66.5 in | Wt 336.2 lb

## 2021-10-26 DIAGNOSIS — N951 Menopausal and female climacteric states: Secondary | ICD-10-CM | POA: Diagnosis not present

## 2021-10-26 DIAGNOSIS — E782 Mixed hyperlipidemia: Secondary | ICD-10-CM

## 2021-10-26 DIAGNOSIS — F331 Major depressive disorder, recurrent, moderate: Secondary | ICD-10-CM

## 2021-10-26 DIAGNOSIS — R7303 Prediabetes: Secondary | ICD-10-CM | POA: Diagnosis not present

## 2021-10-26 DIAGNOSIS — F17200 Nicotine dependence, unspecified, uncomplicated: Secondary | ICD-10-CM | POA: Diagnosis not present

## 2021-10-26 DIAGNOSIS — S83206D Unspecified tear of unspecified meniscus, current injury, right knee, subsequent encounter: Secondary | ICD-10-CM

## 2021-10-26 DIAGNOSIS — E559 Vitamin D deficiency, unspecified: Secondary | ICD-10-CM

## 2021-10-26 DIAGNOSIS — J302 Other seasonal allergic rhinitis: Secondary | ICD-10-CM | POA: Insufficient documentation

## 2021-10-26 DIAGNOSIS — E785 Hyperlipidemia, unspecified: Secondary | ICD-10-CM | POA: Insufficient documentation

## 2021-10-26 DIAGNOSIS — Z6841 Body Mass Index (BMI) 40.0 and over, adult: Secondary | ICD-10-CM

## 2021-10-26 LAB — CBC
HCT: 42.3 % (ref 36.0–46.0)
Hemoglobin: 13.5 g/dL (ref 12.0–15.0)
MCHC: 32 g/dL (ref 30.0–36.0)
MCV: 96.1 fl (ref 78.0–100.0)
Platelets: 307 10*3/uL (ref 150.0–400.0)
RBC: 4.4 Mil/uL (ref 3.87–5.11)
RDW: 15.3 % (ref 11.5–15.5)
WBC: 9.2 10*3/uL (ref 4.0–10.5)

## 2021-10-26 MED ORDER — VITAMIN D (ERGOCALCIFEROL) 1.25 MG (50000 UNIT) PO CAPS
50000.0000 [IU] | ORAL_CAPSULE | ORAL | 2 refills | Status: DC
  Filled 2021-10-26: qty 4, 28d supply, fill #0
  Filled 2021-11-25: qty 4, 28d supply, fill #1
  Filled 2021-12-22: qty 4, 28d supply, fill #2
  Filled 2022-01-09 – 2022-01-13 (×2): qty 3, 21d supply, fill #3

## 2021-10-26 NOTE — Progress Notes (Signed)
[facility] [address] [place] [address] Dept: [phone] Dept Fax: [phone]  Transfer of Care Office Visit  Subjective:    Patient ID: Diana Garrison, female    DOB: 01-26-1971, 51 y.o..   MRN: [id number]  Chief Complaint  Patient presents with   Establish Care    No concerns. Patient is non-fasting. Rx refill on volatren gel. Right knee pain after injury back in May.     History of Present Illness:  Patient is in today to establish care. Diana Garrison was born in [place], [place]. She moved to [place] in 2006, having found it difficult to find work after she had been in jail. She later moved to [facility] in 2013. She has been married for 3 years. She has two children (30, 21). She works for [employer]. [employer] doing Agricultural engineer duties. Diana Garrison smokes about 1/4 ppd of cigarettes, having cut down from near a ppd. She only occasionally drinks alcohol and denies any drug use.  Diana Garrison suffered a fall while bowling back in May. She had some right leg groin pain afterwards. Within a month, she started having right knee pain issues. She was seen by Dr. Blanchie Dessert (orthopedics) and did have a cortisone injection. She has since followed up for an MRI scan and been found to have a cartilage tear. Her knee has calmed down, so they are watching this for now.  Diana Garrison has a history of seasonal allergic rhinitis for which she uses intermittent fluticasone spray and montelukast.  Diana Garrison has a history of hyperlipidemia. She is managed on rosuvastatin 20 mg daily.  Diana Garrison has a history of depression. She notes that her mood does fluctuate somewhat. She is managed on venlafaxine 75 mg daily.  Diana Garrison struggles with obesity issues. She is currently [facility] with [facility]. They have discussed possible medication management for this.  Diana Garrison notes that she had stopped having a menses for 7  months. Recently, she had a menstrual  bleeding for a month. It stopped about a week ago.  Past Medical History: Patient Active Problem List   Diagnosis Date Noted   Seasonal allergic rhinitis 10/26/2021   Prediabetes 10/20/2021   Vitamin D deficiency 10/20/2021   Moderate episode of recurrent major depressive disorder (HCC) 08/16/2021   Right knee meniscal tear 08/16/2021   Perimenopausal 08/16/2021   Spider veins of both lower extremities 10/17/2016   Menorrhagia 06/06/2013   Morbid obesity with BMI of 50.0-59.9, adult (HCC) 06/06/2013   Tobacco use disorder 06/06/2013   Past Surgical History:  Procedure Laterality Date   CESAREAN SECTION N/A    [employer]reesia 11/12/2019   GANGLION CYST EXCISION Bilateral    x2   Family History  Problem Relation Age of Onset   Obesity Father    Cirrhosis Father    Pulmonary embolism Sister    Lupus Brother    Breast cancer Neg Hx    Outpatient Medications Prior to Visit  Medication Sig Dispense Refill   B Complex Vitamins (VITAMIN B COMPLEX PO) Take by mouth.     cyclobenzaprine (FLEXERIL) 10 MG tablet Take 1/2-1 tablet (5-10 mg total) by mouth 3 (three) times daily as needed for muscle spasms. 30 tablet 0   diclofenac Sodium (VOLTAREN) 1 % GEL Apply 2 grams topically 4 times daily as directed. 150 g 1   magnesium 30 MG tablet Take 30 mg by mouth 2 (two) times daily.  meloxicam (MOBIC) 15 MG tablet Take 1 tablet (15 mg total) by mouth daily. 90 tablet 1   montelukast (SINGULAIR) 10 MG tablet TAKE 1 TABLET BY MOUTH EVERYDAY AT BEDTIME 90 tablet 1   rosuvastatin (CRESTOR) 20 MG tablet Take 1 tablet (20 mg total) by mouth daily. 90 tablet 3   venlafaxine XR (EFFEXOR-XR) 75 MG 24 hr capsule Take 1 capsule by mouth daily with breakfast. 90 capsule 1   zinc gluconate 50 MG tablet Take 50 mg by mouth daily.     ibuprofen (ADVIL) 600 MG tablet Take 1 tablet (600 mg total) by mouth every 8 (eight) hours as needed. 30 tablet 0   fluticasone (FLONASE)  50 MCG/ACT nasal spray Place 2 sprays into both nostrils daily. (Patient not taking: Reported on 10/18/2021) 16 g 6   methylPREDNISolone (MEDROL) 4 MG TBPK tablet Take as directed on the pack 21 each 0   traMADol (ULTRAM) 50 MG tablet Take 1 tablet by mouth every eight hours as needed for pain 10 tablet 0   No facility-administered medications prior to visit.   No Known Allergies    Objective:   Today's Vitals   10/26/21 1042  BP: 134/86  Pulse: 78  Temp: (!) 97 F (36.1 C)  TempSrc: Temporal  SpO2: 98%  Weight: (!) 336 lb 3.2 oz (152.5 kg)  Height: 5' 6.5" (1.689 m)  PainSc: 8   PainLoc: Knee   Body mass index is 53.45 kg/m.   General: Well developed, well nourished. No acute distress. Psych: Alert and oriented. Normal mood and affect.  Health Maintenance Due  Topic Date Due   INFLUENZA VACCINE  10/11/2021   Lab Results Last metabolic panel Lab Results  Component Value Date   GLUCOSE 100 (H) 05/11/2021   NA 137 05/11/2021   K 4.3 05/11/2021   CL 104 05/11/2021   CO2 26 05/11/2021   BUN 9 05/11/2021   CREATININE 0.84 05/11/2021   GFRNONAA 99 11/14/2019   CALCIUM 9.3 05/11/2021   PROT 6.6 05/11/2021   ALBUMIN 4.0 05/11/2021   LABGLOB 2.5 11/14/2019   AGRATIO 1.6 11/14/2019   BILITOT 0.4 05/11/2021   ALKPHOS 95 05/11/2021   AST 17 05/11/2021   ALT 21 05/11/2021   Last lipids Lab Results  Component Value Date   CHOL 291 (H) 05/11/2021   HDL 39.60 05/11/2021   LDLCALC 160 (H) 11/14/2019   LDLDIRECT 204.0 05/11/2021   TRIG 218.0 (H) 05/11/2021   CHOLHDL 7 05/11/2021   Last hemoglobin A1c Lab Results  Component Value Date   HGBA1C 5.9 (H) 10/18/2021   Last vitamin D Lab Results  Component Value Date   VD25OH 18.3 (L) 10/18/2021   Imaging: MR of Right Knee wo contrast (10/18/2021) IMPRESSION: 1. Radial tear of the body of the medial meniscus. 2. Small vertical tear of the anterior horn-body junction of the lateral meniscus. 3. Mild  partial-thickness cartilage loss of the medial femorotibial compartment.    Assessment & Plan:   1. Morbid obesity with BMI of 50.0-59.9, adult [facility]) Ms. Kibby is engaged with the [facility]. She had a recent insulin level that was normal, which indicated some insulin resistance. She might consider starting a medication such as metformin, which could assist with weight loss.  2. Prediabetes As above.  3. Mixed hyperlipidemia On rosuvastatin 20 mg daily. Recommend repeat lipids at her next visit.  4. Tobacco use disorder I encouraged her to continue efforts at smoking cessation.  5. Moderate episode of  recurrent major depressive disorder (HCC) Continue venlafaxine 75 mg daily. We will follow this over time.  6. Tear of meniscus of right knee as current injury, unspecified meniscus, unspecified tear type, subsequent encounter Orthopedics plan of observation for now seems reasonable. Ms. Idler understands that she may need surgery in the long run.  7. Perimenopausal Ms Pineau recent episode of menorrhagia could represent a perimenopausal issue. I will check her hormones and screen for anemia today. I would have a low threshold for referral for [place] and/or endometrial biopsy.  - FSH/LH - CBC  8. Vitamin D deficiency Recent Vitamin D level was low. I will start her on a replacement dose of Vit. D.  - Vitamin D, Ergocalciferol, (DRISDOL) 1.25 MG (50000 UNIT) CAPS capsule; Take 1 capsule (50,000 Units total) by mouth every 7 (seven) days.  Dispense: 5 capsule; Refill: 2   Return in about 2 months (around 12/26/2021) for Reassessment.   Loyola Mast, MD

## 2021-10-27 LAB — FSH/LH
FSH: 31 m[IU]/mL
LH: 17.2 m[IU]/mL

## 2021-10-28 ENCOUNTER — Other Ambulatory Visit (HOSPITAL_COMMUNITY): Payer: Self-pay

## 2021-11-01 ENCOUNTER — Other Ambulatory Visit (HOSPITAL_COMMUNITY): Payer: Self-pay

## 2021-11-01 ENCOUNTER — Ambulatory Visit (INDEPENDENT_AMBULATORY_CARE_PROVIDER_SITE_OTHER): Payer: No Typology Code available for payment source | Admitting: Family Medicine

## 2021-11-01 ENCOUNTER — Encounter (INDEPENDENT_AMBULATORY_CARE_PROVIDER_SITE_OTHER): Payer: Self-pay | Admitting: Family Medicine

## 2021-11-01 VITALS — BP 115/78 | HR 96 | Temp 97.9°F | Ht 66.0 in | Wt 338.0 lb

## 2021-11-01 DIAGNOSIS — E7849 Other hyperlipidemia: Secondary | ICD-10-CM | POA: Insufficient documentation

## 2021-11-01 DIAGNOSIS — F3289 Other specified depressive episodes: Secondary | ICD-10-CM | POA: Diagnosis not present

## 2021-11-01 DIAGNOSIS — E559 Vitamin D deficiency, unspecified: Secondary | ICD-10-CM | POA: Diagnosis not present

## 2021-11-01 DIAGNOSIS — R7303 Prediabetes: Secondary | ICD-10-CM | POA: Diagnosis not present

## 2021-11-01 DIAGNOSIS — F32A Depression, unspecified: Secondary | ICD-10-CM | POA: Insufficient documentation

## 2021-11-01 DIAGNOSIS — Z6841 Body Mass Index (BMI) 40.0 and over, adult: Secondary | ICD-10-CM

## 2021-11-01 DIAGNOSIS — E669 Obesity, unspecified: Secondary | ICD-10-CM

## 2021-11-01 MED ORDER — QSYMIA 3.75-23 MG PO CP24
1.0000 | ORAL_CAPSULE | Freq: Every day | ORAL | 0 refills | Status: DC
  Filled 2021-11-01 – 2021-11-10 (×3): qty 14, 14d supply, fill #0

## 2021-11-02 ENCOUNTER — Other Ambulatory Visit (HOSPITAL_COMMUNITY): Payer: Self-pay

## 2021-11-02 ENCOUNTER — Other Ambulatory Visit: Payer: Self-pay | Admitting: Family Medicine

## 2021-11-04 ENCOUNTER — Other Ambulatory Visit (HOSPITAL_COMMUNITY): Payer: Self-pay

## 2021-11-06 NOTE — Progress Notes (Unsigned)
Chief Complaint:   OBESITY Diana Garrison is here to discuss her progress with her obesity treatment plan along with follow-up of her obesity related diagnoses. Diana Garrison is on the Category 3 Plan and states she is following her eating plan approximately 85% of the time. Diana Garrison states she is not exercising.   Today's visit was #: 2 Starting weight: 335 lbs Starting date: 10/18/2021 Today's weight: 338 lbs Today's date: 11/01/2021 Total lbs lost to date: 0 Total lbs lost since last in-office visit: +3  Interim History: denies meal skipping on meal plan, other than worsening depression that caused meal skipping.  Hunger but has cravings for junk food.  Drinks water.  Doing home exercise videos.  Plans to start walking.  Never used AOM.  Sister died following gastric bypass.   Subjective:   1. Prediabetes Discussed labs with patient today. 10/18/2021, A1c 5.9, down from 6.2.  Fasting insulin high at 25.  Reviewed what insulin resistance means and the importance of making changes.   2. Vitamin D deficiency Discussed labs with patient today. 10/18/2021, Vitamin D level low at 18.3.  Started prescription Vitamin D with 50,000 IU weekly with PCP.  3. Other hyperlipidemia On Crestor, last LDL 204 on 05/11/2021.  She has improved compliance with taking Crestor.  4. Other depression, with emotional eating PHQ-9-17 on Effexor daily.  Husband is supportive.   Assessment/Plan:   1. Prediabetes Recheck A1c, fasting insulin in 3 months.   2. Vitamin D deficiency Continue prescription Vitamin D weekly.   3. Other hyperlipidemia Have FLP recheck in 3 months.  Continue Crestor nightly.   4. Other depression, with emotional eating Work on mindful eating.  Plan to start CBT with Dr Dewaine Conger.   5. Obesity, current BMI 54.6 Informed consent signed.  Not at risk for pregnancy.   Begin - Phentermine-Topiramate (QSYMIA) 3.75-23 MG CP24; Take 1 capsule by mouth daily.  Dispense: 14 capsule;  Refill: 0  Diana Garrison is currently in the action stage of change. As such, her goal is to continue with weight loss efforts. She has agreed to the Category 3 Plan.   Exercise goals:  As is.   Behavioral modification strategies: increasing lean protein intake, increasing water intake, no skipping meals, meal planning and cooking strategies, better snacking choices, and decreasing junk food.  Diana Garrison has agreed to follow-up with our clinic in 2-3 weeks. She was informed of the importance of frequent follow-up visits to maximize her success with intensive lifestyle modifications for her multiple health conditions.   Objective:   Blood pressure 115/78, pulse 96, temperature 97.9 F (36.6 C), height 5\' 6"  (1.676 m), weight (!) 338 lb (153.3 kg), SpO2 99 %. Body mass index is 54.55 kg/m.  General: Cooperative, alert, well developed, in no acute distress. HEENT: Conjunctivae and lids unremarkable. Cardiovascular: Regular rhythm.  Lungs: Normal work of breathing. Neurologic: No focal deficits.   Lab Results  Component Value Date   CREATININE 0.84 05/11/2021   BUN 9 05/11/2021   NA 137 05/11/2021   K 4.3 05/11/2021   CL 104 05/11/2021   CO2 26 05/11/2021   Lab Results  Component Value Date   ALT 21 05/11/2021   AST 17 05/11/2021   ALKPHOS 95 05/11/2021   BILITOT 0.4 05/11/2021   Lab Results  Component Value Date   HGBA1C 5.9 (H) 10/18/2021   HGBA1C 6.2 05/11/2021   HGBA1C 6.1 09/09/2020   HGBA1C 6.2 (H) 11/14/2019   HGBA1C 5.8 (H) 11/06/2018   No  results found for: "INSULIN" Lab Results  Component Value Date   TSH 1.77 05/11/2021   Lab Results  Component Value Date   CHOL 291 (H) 05/11/2021   HDL 39.60 05/11/2021   LDLCALC 160 (H) 11/14/2019   LDLDIRECT 204.0 05/11/2021   TRIG 218.0 (H) 05/11/2021   CHOLHDL 7 05/11/2021   Lab Results  Component Value Date   VD25OH 18.3 (L) 10/18/2021   VD25OH 15.7 (L) 11/14/2019   VD25OH 21.8 (L) 11/27/2018   Lab Results   Component Value Date   WBC 9.2 10/26/2021   HGB 13.5 10/26/2021   HCT 42.3 10/26/2021   MCV 96.1 10/26/2021   PLT 307.0 10/26/2021   Lab Results  Component Value Date   FERRITIN 34 10/18/2021   Attestation Statements:   Reviewed by clinician on day of visit: allergies, medications, problem list, medical history, surgical history, family history, social history, and previous encounter notes. Diana Garrison, am acting as Energy manager for Diana Bars, DO.  I have reviewed the above documentation for accuracy and completeness, and I agree with the above. Diana Brink, DO

## 2021-11-07 ENCOUNTER — Encounter (INDEPENDENT_AMBULATORY_CARE_PROVIDER_SITE_OTHER): Payer: Self-pay

## 2021-11-07 ENCOUNTER — Telehealth (INDEPENDENT_AMBULATORY_CARE_PROVIDER_SITE_OTHER): Payer: Self-pay

## 2021-11-07 ENCOUNTER — Other Ambulatory Visit (HOSPITAL_COMMUNITY): Payer: Self-pay

## 2021-11-07 MED ORDER — ZINC GLUCONATE 50 MG PO TABS
50.0000 mg | ORAL_TABLET | Freq: Every day | ORAL | 0 refills | Status: AC
  Filled 2021-11-07: qty 30, 30d supply, fill #0

## 2021-11-07 MED ORDER — MAGNESIUM 30 MG PO TABS
30.0000 mg | ORAL_TABLET | Freq: Two times a day (BID) | ORAL | 0 refills | Status: AC
  Filled 2021-11-07: qty 60, 30d supply, fill #0

## 2021-11-07 NOTE — Telephone Encounter (Signed)
Patient sent response to message via mychart.

## 2021-11-07 NOTE — Telephone Encounter (Signed)
Patient calling in to see how the PA for her Phentermine-Topiramate (QSYMIA) 3.75-23 MG CP24.   Patient would like a call back or a mychart message about PA.

## 2021-11-08 ENCOUNTER — Other Ambulatory Visit (HOSPITAL_COMMUNITY): Payer: Self-pay

## 2021-11-09 ENCOUNTER — Encounter (INDEPENDENT_AMBULATORY_CARE_PROVIDER_SITE_OTHER): Payer: Self-pay

## 2021-11-09 ENCOUNTER — Telehealth (INDEPENDENT_AMBULATORY_CARE_PROVIDER_SITE_OTHER): Payer: Self-pay | Admitting: Family Medicine

## 2021-11-09 NOTE — Telephone Encounter (Signed)
Dr. Bowen - Prior authorization denied for Qsymia. Per insurance: For weight loss management, our guideline named ANTI-OBESITY AGENTS (Qsymia)  requires that you had a trial of or contraindication (harmful for) to the preferred medications: Saxenda and Wegovy. Patient sent denial message via mychart.  

## 2021-11-10 ENCOUNTER — Encounter (INDEPENDENT_AMBULATORY_CARE_PROVIDER_SITE_OTHER): Payer: Self-pay

## 2021-11-10 ENCOUNTER — Other Ambulatory Visit (HOSPITAL_COMMUNITY): Payer: Self-pay

## 2021-11-10 NOTE — Telephone Encounter (Signed)
Patient would like to see if Dr. Cathey Endow would do appeal. Patient would also like to see if Dr. Cathey Endow could give her the website where she can pay out of pocket for it if Dr. Cathey Endow cant do the appeal.

## 2021-11-17 ENCOUNTER — Other Ambulatory Visit (HOSPITAL_COMMUNITY): Payer: Self-pay

## 2021-11-19 ENCOUNTER — Other Ambulatory Visit (HOSPITAL_COMMUNITY): Payer: Self-pay

## 2021-11-23 ENCOUNTER — Other Ambulatory Visit (HOSPITAL_COMMUNITY): Payer: Self-pay

## 2021-11-25 ENCOUNTER — Other Ambulatory Visit (HOSPITAL_COMMUNITY): Payer: Self-pay

## 2021-12-05 ENCOUNTER — Ambulatory Visit (INDEPENDENT_AMBULATORY_CARE_PROVIDER_SITE_OTHER): Payer: No Typology Code available for payment source | Admitting: Family Medicine

## 2021-12-05 ENCOUNTER — Encounter (INDEPENDENT_AMBULATORY_CARE_PROVIDER_SITE_OTHER): Payer: Self-pay | Admitting: Family Medicine

## 2021-12-05 ENCOUNTER — Other Ambulatory Visit (HOSPITAL_COMMUNITY): Payer: Self-pay

## 2021-12-05 VITALS — BP 136/89 | HR 94 | Temp 98.4°F | Wt 332.0 lb

## 2021-12-05 DIAGNOSIS — E669 Obesity, unspecified: Secondary | ICD-10-CM

## 2021-12-05 DIAGNOSIS — E7849 Other hyperlipidemia: Secondary | ICD-10-CM | POA: Diagnosis not present

## 2021-12-05 DIAGNOSIS — E559 Vitamin D deficiency, unspecified: Secondary | ICD-10-CM

## 2021-12-05 DIAGNOSIS — Z6841 Body Mass Index (BMI) 40.0 and over, adult: Secondary | ICD-10-CM

## 2021-12-05 DIAGNOSIS — R7303 Prediabetes: Secondary | ICD-10-CM

## 2021-12-05 MED ORDER — QSYMIA 7.5-46 MG PO CP24
ORAL_CAPSULE | ORAL | 0 refills | Status: DC
  Filled 2021-12-05 – 2021-12-08 (×3): qty 30, 30d supply, fill #0

## 2021-12-06 ENCOUNTER — Other Ambulatory Visit (HOSPITAL_COMMUNITY): Payer: Self-pay

## 2021-12-06 ENCOUNTER — Other Ambulatory Visit (INDEPENDENT_AMBULATORY_CARE_PROVIDER_SITE_OTHER): Payer: Self-pay | Admitting: Family Medicine

## 2021-12-07 NOTE — Progress Notes (Unsigned)
Chief Complaint:   OBESITY Diana Garrison is here to discuss her progress with her obesity treatment plan along with follow-up of her obesity related diagnoses. Diana Garrison is on the Category 3 Plan and states she is following her eating plan approximately 100% of the time. Diana Garrison states she is doing you tube workout videos 30 minutes 3-4 times per week.  Today's visit was #: 3 Starting weight: 335 lbs Starting date: 10/18/2021 Today's weight: 332 lbs Today's date: 12/05/2021 Total lbs lost to date: 2 lbs Total lbs lost since last in-office visit: 6 lbs  Interim History: Doing better on meal plan.  Cooking more at home and bringing food to work.  Eating more fruits and veggies.  Started Qsymia 3.75/23 mg and seeing improvements in appetite.  Started chair exercises with resistance bands.  Off soda.    Subjective:   1. Pre-diabetes A1c 5.9, Fasting insulin 25.  Actively has cut out SSB's.   2. Vitamin D deficiency She is currently taking prescription vitamin D 50,000 IU each week through PCP.  She denies nausea, vomiting or muscle weakness.  3. Other hyperlipidemia She is on Crestor 20 mg daily per PCP.  She denies myalgia's.   Assessment/Plan:   1. Pre-diabetes Explained to her how to read food labels for added sugar.   2. Vitamin D deficiency Low Vitamin D level contributes to fatigue and are associated with obesity, breast, and colon cancer. She agrees to continue to take prescription Vitamin D @50 ,000 IU every week and will follow-up for routine testing of Vitamin D, at least 2-3 times per year to avoid over-replacement.  3. Other hyperlipidemia Continue Crestor 20 mg daily. Look for lipid improvements with healthy diet and regular exercise.    4. Obesity,current BMI 53.6 1) Fruit handout given. 2) Recommend fairlife protein shake in place of chocolate milk. 3) Replace Nutrigrain bar with whole fruit. 4) Qsymia increased to 7.5/46 mg every morning.  Increase -  Phentermine-Topiramate (QSYMIA) 7.5-46 MG CP24; Take 1 capsule by mouth each morning,  Dispense: 30 capsule; Refill: 0  Diana Garrison is currently in the action stage of change. As such, her goal is to continue with weight loss efforts. She has agreed to the Category 3 Plan.   Exercise goals:  increase walking time.   Behavioral modification strategies: increasing lean protein intake, increasing water intake, decreasing liquid calories, increasing high fiber foods, decreasing eating out, no skipping meals, emotional eating strategies, and decreasing junk food.  Diana Garrison has agreed to follow-up with our clinic in 3 weeks. She was informed of the importance of frequent follow-up visits to maximize her success with intensive lifestyle modifications for her multiple health conditions.   Objective:   Blood pressure 136/89, pulse 94, temperature 98.4 F (36.9 C), weight (!) 332 lb (150.6 kg), SpO2 96 %. Body mass index is 53.59 kg/m.  General: Cooperative, alert, well developed, in no acute distress. HEENT: Conjunctivae and lids unremarkable. Cardiovascular: Regular rhythm.  Lungs: Normal work of breathing. Neurologic: No focal deficits.   Lab Results  Component Value Date   CREATININE 0.84 05/11/2021   BUN 9 05/11/2021   NA 137 05/11/2021   K 4.3 05/11/2021   CL 104 05/11/2021   CO2 26 05/11/2021   Lab Results  Component Value Date   ALT 21 05/11/2021   AST 17 05/11/2021   ALKPHOS 95 05/11/2021   BILITOT 0.4 05/11/2021   Lab Results  Component Value Date   HGBA1C 5.9 (H) 10/18/2021   HGBA1C 6.2  05/11/2021   HGBA1C 6.1 09/09/2020   HGBA1C 6.2 (H) 11/14/2019   HGBA1C 5.8 (H) 11/06/2018   No results found for: "INSULIN" Lab Results  Component Value Date   TSH 1.77 05/11/2021   Lab Results  Component Value Date   CHOL 291 (H) 05/11/2021   HDL 39.60 05/11/2021   LDLCALC 160 (H) 11/14/2019   LDLDIRECT 204.0 05/11/2021   TRIG 218.0 (H) 05/11/2021   CHOLHDL 7 05/11/2021    Lab Results  Component Value Date   VD25OH 18.3 (L) 10/18/2021   VD25OH 15.7 (L) 11/14/2019   VD25OH 21.8 (L) 11/27/2018   Lab Results  Component Value Date   WBC 9.2 10/26/2021   HGB 13.5 10/26/2021   HCT 42.3 10/26/2021   MCV 96.1 10/26/2021   PLT 307.0 10/26/2021   Lab Results  Component Value Date   FERRITIN 34 10/18/2021   Attestation Statements:   Reviewed by clinician on day of visit: allergies, medications, problem list, medical history, surgical history, family history, social history, and previous encounter notes.  I, Davy Pique, am acting as Location manager for Loyal Gambler, DO.  I have reviewed the above documentation for accuracy and completeness, and I agree with the above. Dell Ponto, DO

## 2021-12-08 ENCOUNTER — Encounter (INDEPENDENT_AMBULATORY_CARE_PROVIDER_SITE_OTHER): Payer: Self-pay

## 2021-12-08 ENCOUNTER — Other Ambulatory Visit (HOSPITAL_COMMUNITY): Payer: Self-pay

## 2021-12-08 ENCOUNTER — Telehealth (INDEPENDENT_AMBULATORY_CARE_PROVIDER_SITE_OTHER): Payer: Self-pay | Admitting: Family Medicine

## 2021-12-08 NOTE — Telephone Encounter (Signed)
Dr. Valetta Close - Prior authorization denied for Qsymia. Per insurance: For weight loss management, our guideline named ANTI-OBESITY AGENTS (Qsymia)  requires that you had a trial of or contraindication (harmful for) to the preferred medications: Saxenda and Wegovy. Patient sent denial message via mychart.

## 2021-12-09 ENCOUNTER — Other Ambulatory Visit (HOSPITAL_COMMUNITY): Payer: Self-pay

## 2021-12-10 ENCOUNTER — Other Ambulatory Visit (HOSPITAL_COMMUNITY): Payer: Self-pay

## 2021-12-14 ENCOUNTER — Other Ambulatory Visit (HOSPITAL_COMMUNITY): Payer: Self-pay

## 2021-12-14 NOTE — Telephone Encounter (Signed)
Phone call to patient to let her know her qsymia was not covered. Patient did pay cash for the medication so she has it and is taking is as directed.

## 2021-12-22 ENCOUNTER — Other Ambulatory Visit (HOSPITAL_COMMUNITY): Payer: Self-pay

## 2022-01-03 ENCOUNTER — Ambulatory Visit (INDEPENDENT_AMBULATORY_CARE_PROVIDER_SITE_OTHER): Payer: No Typology Code available for payment source | Admitting: Internal Medicine

## 2022-01-03 ENCOUNTER — Other Ambulatory Visit (HOSPITAL_COMMUNITY): Payer: Self-pay

## 2022-01-03 ENCOUNTER — Encounter (INDEPENDENT_AMBULATORY_CARE_PROVIDER_SITE_OTHER): Payer: Self-pay | Admitting: Internal Medicine

## 2022-01-03 VITALS — BP 121/85 | HR 98 | Temp 97.5°F | Ht 66.0 in | Wt 332.0 lb

## 2022-01-03 DIAGNOSIS — Z6841 Body Mass Index (BMI) 40.0 and over, adult: Secondary | ICD-10-CM | POA: Diagnosis not present

## 2022-01-03 DIAGNOSIS — E7849 Other hyperlipidemia: Secondary | ICD-10-CM

## 2022-01-03 DIAGNOSIS — E669 Obesity, unspecified: Secondary | ICD-10-CM

## 2022-01-03 MED ORDER — QSYMIA 7.5-46 MG PO CP24
1.0000 | ORAL_CAPSULE | Freq: Every morning | ORAL | 0 refills | Status: DC
  Filled 2022-01-03: qty 30, 30d supply, fill #0
  Filled 2022-01-03: qty 30, fill #0

## 2022-01-05 ENCOUNTER — Other Ambulatory Visit (HOSPITAL_COMMUNITY): Payer: Self-pay

## 2022-01-09 ENCOUNTER — Other Ambulatory Visit (HOSPITAL_COMMUNITY): Payer: Self-pay

## 2022-01-11 NOTE — Progress Notes (Signed)
Chief Complaint:   OBESITY [name] is here to discuss her progress with her obesity treatment plan along with follow-up of her obesity related diagnoses. Diana Garrison is on the Category 3 Plan and states she is following her eating plan approximately 100% of the time. Diana Garrison states she is doing chair exercises for 45 minutes 4 times per week.  Today's visit was #: 4 Starting weight: 335 lbs Starting date: 10/18/2021 Today's weight: 332 lbs Today's date: 01/03/2022 Total lbs lost to date: 3 Total lbs lost since last in-office visit: 0  Interim History: Diana Garrison reports following the meal plan closely but she is on recall she may not be hitting her protein goal.  She has been on Qsymia for about 4 weeks.  She notes decrease in appetite.  She is inquiring about GLP-1 drugs.  She denies medication side effects.  She is weight neutral.  She has increased her water intake and she is eating a protein bar occasionally for snacking.  Subjective:   1. Other hyperlipidemia Diana Garrison's last LDL was 204 in March 2023. She is on rosuvastatin. This is likely familial hypercholesterolemia.   Assessment/Plan:   1. Other hyperlipidemia We will recheck FLP at her next office visit. Dorismar will continue with statin, and we will replenish Vitamin D to reduce the risk of myopathy.   2. Obesity,current BMI 53.6 Reality is currently in the action stage of change. As such, her goal is to continue with weight loss efforts. She has agreed to the Category 3 Plan.   Diana Garrison will continue Qsymia for a total of 3 months and discontinue if it is ineffective.  She was advised on journaling to ensure that she is within range of prescribed calories as there is a discrepancy between effort perceived and weight loss.  We will refill Qsymia for 1 month.  - Phentermine-Topiramate (QSYMIA) 7.5-46 MG CP24; Take 1 capsule by mouth every morning.  Dispense: 30 capsule; Refill: 0  Exercise goals: As is.    Behavioral modification strategies: increasing lean protein intake, meal planning and cooking strategies, planning for success, and keeping a strict food journal.  Diana Garrison has agreed to follow-up with our clinic in 2 weeks. She was informed of the importance of frequent follow-up visits to maximize her success with intensive lifestyle modifications for her multiple health conditions.   Objective:   Blood pressure 121/85, pulse 98, temperature (!) 97.5 F (36.4 C), height 5\' 6"  (1.676 m), weight (!) 332 lb (150.6 kg), SpO2 100 %. Body mass index is 53.59 kg/m.  General: Cooperative, alert, well developed, in no acute distress. HEENT: Conjunctivae and lids unremarkable. Cardiovascular: Regular rhythm.  Lungs: Normal work of breathing. Neurologic: No focal deficits.   Lab Results  Component Value Date   CREATININE 0.84 05/11/2021   BUN 9 05/11/2021   NA 137 05/11/2021   K 4.3 05/11/2021   CL 104 05/11/2021   CO2 26 05/11/2021   Lab Results  Component Value Date   ALT 21 05/11/2021   AST 17 05/11/2021   ALKPHOS 95 05/11/2021   BILITOT 0.4 05/11/2021   Lab Results  Component Value Date   HGBA1C 5.9 (H) 10/18/2021   HGBA1C 6.2 05/11/2021   HGBA1C 6.1 09/09/2020   HGBA1C 6.2 (H) 11/14/2019   HGBA1C 5.8 (H) 11/06/2018   No results found for: "INSULIN" Lab Results  Component Value Date   TSH 1.77 05/11/2021   Lab Results  Component Value Date   CHOL 291 (H) 05/11/2021   HDL  39.60 05/11/2021   LDLCALC 160 (H) 11/14/2019   LDLDIRECT 204.0 05/11/2021   TRIG 218.0 (H) 05/11/2021   CHOLHDL 7 05/11/2021   Lab Results  Component Value Date   VD25OH 18.3 (L) 10/18/2021   VD25OH 15.7 (L) 11/14/2019   VD25OH 21.8 (L) 11/27/2018   Lab Results  Component Value Date   WBC 9.2 10/26/2021   HGB 13.5 10/26/2021   HCT 42.3 10/26/2021   MCV 96.1 10/26/2021   PLT 307.0 10/26/2021   Lab Results  Component Value Date   FERRITIN 34 10/18/2021   Attestation Statements:    Reviewed by clinician on day of visit: allergies, medications, problem list, medical history, surgical history, family history, social history, and previous encounter notes.  Time spent on visit including pre-visit chart review and post-visit care and charting was 20 minutes.   Trude Mcburney, am acting as transcriptionist for Worthy Rancher, MD.  I have reviewed the above documentation for accuracy and completeness, and I agree with the above. -Worthy Rancher, MD

## 2022-01-13 ENCOUNTER — Other Ambulatory Visit (HOSPITAL_COMMUNITY): Payer: Self-pay

## 2022-01-28 ENCOUNTER — Other Ambulatory Visit: Payer: Self-pay | Admitting: Registered Nurse

## 2022-01-28 ENCOUNTER — Other Ambulatory Visit (HOSPITAL_COMMUNITY): Payer: Self-pay

## 2022-01-28 DIAGNOSIS — F331 Major depressive disorder, recurrent, moderate: Secondary | ICD-10-CM

## 2022-01-28 MED ORDER — PREDNISONE 10 MG PO TABS
ORAL_TABLET | ORAL | 0 refills | Status: DC
  Filled 2022-01-28: qty 21, 6d supply, fill #0

## 2022-01-28 MED ORDER — DICLOFENAC SODIUM 1 % EX GEL
2.0000 g | Freq: Four times a day (QID) | CUTANEOUS | 3 refills | Status: DC | PRN
  Filled 2022-01-28: qty 100, 30d supply, fill #0
  Filled 2022-02-22: qty 300, 19d supply, fill #0

## 2022-01-31 ENCOUNTER — Other Ambulatory Visit (HOSPITAL_COMMUNITY): Payer: Self-pay

## 2022-02-03 ENCOUNTER — Other Ambulatory Visit: Payer: Self-pay | Admitting: Family Medicine

## 2022-02-03 ENCOUNTER — Other Ambulatory Visit (HOSPITAL_COMMUNITY): Payer: Self-pay

## 2022-02-14 ENCOUNTER — Other Ambulatory Visit (HOSPITAL_COMMUNITY): Payer: Self-pay

## 2022-02-22 ENCOUNTER — Other Ambulatory Visit (HOSPITAL_COMMUNITY): Payer: Self-pay

## 2022-02-24 ENCOUNTER — Other Ambulatory Visit: Payer: Self-pay

## 2022-03-14 DIAGNOSIS — M1711 Unilateral primary osteoarthritis, right knee: Secondary | ICD-10-CM | POA: Diagnosis not present

## 2022-03-15 ENCOUNTER — Other Ambulatory Visit: Payer: Self-pay | Admitting: Family Medicine

## 2022-03-15 ENCOUNTER — Other Ambulatory Visit: Payer: Self-pay | Admitting: Registered Nurse

## 2022-03-15 DIAGNOSIS — E559 Vitamin D deficiency, unspecified: Secondary | ICD-10-CM

## 2022-03-15 DIAGNOSIS — F331 Major depressive disorder, recurrent, moderate: Secondary | ICD-10-CM

## 2022-03-15 DIAGNOSIS — M25561 Pain in right knee: Secondary | ICD-10-CM

## 2022-03-16 ENCOUNTER — Other Ambulatory Visit: Payer: Self-pay

## 2022-03-16 ENCOUNTER — Other Ambulatory Visit (HOSPITAL_COMMUNITY): Payer: Self-pay

## 2022-03-21 DIAGNOSIS — M1711 Unilateral primary osteoarthritis, right knee: Secondary | ICD-10-CM | POA: Diagnosis not present

## 2022-03-30 ENCOUNTER — Other Ambulatory Visit (HOSPITAL_COMMUNITY): Payer: Self-pay

## 2022-03-30 ENCOUNTER — Encounter (INDEPENDENT_AMBULATORY_CARE_PROVIDER_SITE_OTHER): Payer: Self-pay | Admitting: Physician Assistant

## 2022-03-30 ENCOUNTER — Other Ambulatory Visit: Payer: Self-pay | Admitting: Registered Nurse

## 2022-03-30 ENCOUNTER — Ambulatory Visit (INDEPENDENT_AMBULATORY_CARE_PROVIDER_SITE_OTHER): Payer: 59 | Admitting: Physician Assistant

## 2022-03-30 VITALS — BP 118/75 | HR 80 | Temp 98.1°F | Ht 66.0 in | Wt 332.0 lb

## 2022-03-30 DIAGNOSIS — E66813 Obesity, class 3: Secondary | ICD-10-CM

## 2022-03-30 DIAGNOSIS — E559 Vitamin D deficiency, unspecified: Secondary | ICD-10-CM | POA: Diagnosis not present

## 2022-03-30 DIAGNOSIS — M1711 Unilateral primary osteoarthritis, right knee: Secondary | ICD-10-CM | POA: Diagnosis not present

## 2022-03-30 DIAGNOSIS — M25561 Pain in right knee: Secondary | ICD-10-CM

## 2022-03-30 DIAGNOSIS — R7303 Prediabetes: Secondary | ICD-10-CM

## 2022-03-30 DIAGNOSIS — Z6841 Body Mass Index (BMI) 40.0 and over, adult: Secondary | ICD-10-CM | POA: Diagnosis not present

## 2022-03-30 DIAGNOSIS — E669 Obesity, unspecified: Secondary | ICD-10-CM | POA: Diagnosis not present

## 2022-03-30 MED ORDER — METFORMIN HCL 500 MG PO TABS
500.0000 mg | ORAL_TABLET | Freq: Every day | ORAL | 0 refills | Status: DC
  Filled 2022-03-30: qty 30, 30d supply, fill #0

## 2022-04-05 ENCOUNTER — Telehealth (INDEPENDENT_AMBULATORY_CARE_PROVIDER_SITE_OTHER): Payer: Self-pay | Admitting: Physician Assistant

## 2022-04-05 NOTE — Telephone Encounter (Signed)
Spoke with the patient and she reports she took metformin earlier this week and had abdominal pain afterwards and wondered if she should be on an extended release form instead.  She is not having any abdominal pain today and no nausea, vomiting, diarrhea or other symptoms. We discussed remaining off metformin until she follows up next week in the office.  She is not certain this was the source of her abdominal pain and wants to try to take it again when she is off work this weekend and we agreed that she could, but if she has any problems again, we will likely need to stop the metformin. We also discussed that we would address any alternatives at the  next visit.   Marwa Fuhrman,PA-C

## 2022-04-05 NOTE — Progress Notes (Unsigned)
Chief Complaint:   OBESITY Diana Garrison is here to discuss her progress with her obesity treatment plan along with follow-up of her obesity related diagnoses. Diana Garrison is on the Category 3 Plan and states she is following her eating plan approximately 30% of the time. Ecko states she is walking 20 minutes 4 times per week.  Today's visit was #: 5 Starting weight: 335 lbs Starting date: 10/18/2021 Today's weight: 332 lbs Today's date: 03/30/2022 Total lbs lost to date: 3 lbs Total lbs lost since last in-office visit: 0  Interim History: Last office visit with Dr. Gerarda Fraction 01/03/2022.  Reports was caring for her mother who was very ill for the past 2 months out of state [facility]) Reports did not take her medications while there for the past 2 months.  Wants to get back on track with her nutrition plan.  Was taking Qysmia 7.5-46 mg (started 822/23 but had been ~ weight neutral with Qysmia. Reports she has run out of medication and came in for a refill and to get back on track with her nutrition plan. Also asking about other medications for weight management.  Would like to try journaling to have more options than with Cat 3 plan and agrees to try MyFitness Pal or Lose it.  We discussed goals of 1500-1700 calories and 110 grams of protein daily.   Subjective:   1. Vitamin D deficiency Diana Garrison is taking ergocalciferol once weekly-per PCP.  Denies any side effects.  Level of 18.3 on 10/18/21-not at goal.  2. Prediabetes A1c was 5.9 on 10/18/21, insulin 25.  Discussed Metformin and provided hand-out on Metformin/IR provided.  Patient agreeable to trial of Metformin with risks/benefits reviewed.  Assessment/Plan:   1. Vitamin D deficiency Continue ergocalciferol once weekly.  Recheck level 2-3 times a year to avoid over supplementation.   2. Prediabetes Start Metformin 500 mg daily for 1 month with 0 refills. Begin Metformin. Continue Prescribed Nutrition Plan with change to  journaling as noted and exercise to promote weight loss and increase glycemic control.  -Start metFORMIN (GLUCOPHAGE) 500 MG tablet; Take 1 tablet (500 mg total) by mouth daily.  Dispense: 30 tablet; Refill: 0  3. Obesity,current BMI 53.7 [name] is currently in the action stage of change. As such, her goal is to continue with weight loss efforts. She has agreed to the Category 3 Plan and keeping a food journal and adhering to recommended goals of 1500-1700 calories and 110+ grams of protein daily.   Exercise goals: As is.  Behavioral modification strategies: increasing lean protein intake, decreasing simple carbohydrates, meal planning and cooking strategies, keeping healthy foods in the home, planning for success, and keeping a strict food journal.  Catherin has agreed to follow-up with our clinic in 2 weeks. She was informed of the importance of frequent follow-up visits to maximize her success with intensive lifestyle modifications for her multiple health conditions.   Objective:   Blood pressure 118/75, pulse 80, temperature 98.1 F (36.7 C), height 5\' 6"  (1.676 m), weight (!) 332 lb (150.6 kg), SpO2 100 %. Body mass index is 53.59 kg/m.  General: Cooperative, alert, well developed, in no acute distress. HEENT: Conjunctivae and lids unremarkable. Cardiovascular: Regular rhythm.  Lungs: Normal work of breathing. Neurologic: No focal deficits.   Lab Results  Component Value Date   CREATININE 0.84 05/11/2021   BUN 9 05/11/2021   NA 137 05/11/2021   K 4.3 05/11/2021   CL 104 05/11/2021   CO2 26 05/11/2021  Lab Results  Component Value Date   ALT 21 05/11/2021   AST 17 05/11/2021   ALKPHOS 95 05/11/2021   BILITOT 0.4 05/11/2021   Lab Results  Component Value Date   HGBA1C 5.9 (H) 10/18/2021   HGBA1C 6.2 05/11/2021   HGBA1C 6.1 09/09/2020   HGBA1C 6.2 (H) 11/14/2019   HGBA1C 5.8 (H) 11/06/2018   No results found for: "INSULIN" Lab Results  Component Value Date    TSH 1.77 05/11/2021   Lab Results  Component Value Date   CHOL 291 (H) 05/11/2021   HDL 39.60 05/11/2021   LDLCALC 160 (H) 11/14/2019   LDLDIRECT 204.0 05/11/2021   TRIG 218.0 (H) 05/11/2021   CHOLHDL 7 05/11/2021   Lab Results  Component Value Date   VD25OH 18.3 (L) 10/18/2021   VD25OH 15.7 (L) 11/14/2019   VD25OH 21.8 (L) 11/27/2018   Lab Results  Component Value Date   WBC 9.2 10/26/2021   HGB 13.5 10/26/2021   HCT 42.3 10/26/2021   MCV 96.1 10/26/2021   PLT 307.0 10/26/2021   Lab Results  Component Value Date   FERRITIN 34 10/18/2021   Attestation Statements:   Reviewed by clinician on day of visit: allergies, medications, problem list, medical history, surgical history, family history, social history, and previous encounter notes.  I, Brendell Tyus, am acting as transcriptionist for [employer], [place].  I have reviewed the above documentation for accuracy and completeness, and I agree with the above. -  Navjot Loera,PA-C

## 2022-04-05 NOTE — Telephone Encounter (Signed)
Pt wants states that Metformin is not working for her. It made her feel like she was going to die.Pt wants to try the extended release Metformin. Pt wants to know if she can have Bentyl for her stomach cramps.Pt also wants to know if she can try Jardiance as well. Please call pt at the number of file today.       [facility].

## 2022-04-12 ENCOUNTER — Encounter (INDEPENDENT_AMBULATORY_CARE_PROVIDER_SITE_OTHER): Payer: Self-pay | Admitting: Physician Assistant

## 2022-04-12 ENCOUNTER — Ambulatory Visit (INDEPENDENT_AMBULATORY_CARE_PROVIDER_SITE_OTHER): Payer: 59 | Admitting: Physician Assistant

## 2022-04-12 ENCOUNTER — Other Ambulatory Visit: Payer: Self-pay

## 2022-04-12 ENCOUNTER — Other Ambulatory Visit (HOSPITAL_COMMUNITY): Payer: Self-pay

## 2022-04-12 VITALS — BP 121/80 | HR 85 | Temp 98.4°F | Ht 66.0 in | Wt 331.0 lb

## 2022-04-12 DIAGNOSIS — Z6841 Body Mass Index (BMI) 40.0 and over, adult: Secondary | ICD-10-CM

## 2022-04-12 DIAGNOSIS — E669 Obesity, unspecified: Secondary | ICD-10-CM | POA: Diagnosis not present

## 2022-04-12 DIAGNOSIS — E559 Vitamin D deficiency, unspecified: Secondary | ICD-10-CM | POA: Diagnosis not present

## 2022-04-12 DIAGNOSIS — R7303 Prediabetes: Secondary | ICD-10-CM

## 2022-04-12 MED ORDER — VITAMIN D (ERGOCALCIFEROL) 1.25 MG (50000 UNIT) PO CAPS
50000.0000 [IU] | ORAL_CAPSULE | ORAL | 2 refills | Status: DC
  Filled 2022-04-12 – 2022-04-25 (×2): qty 5, 35d supply, fill #0

## 2022-04-15 ENCOUNTER — Other Ambulatory Visit: Payer: Self-pay | Admitting: Registered Nurse

## 2022-04-15 DIAGNOSIS — F331 Major depressive disorder, recurrent, moderate: Secondary | ICD-10-CM

## 2022-04-16 ENCOUNTER — Other Ambulatory Visit: Payer: Self-pay | Admitting: Registered Nurse

## 2022-04-16 DIAGNOSIS — F331 Major depressive disorder, recurrent, moderate: Secondary | ICD-10-CM

## 2022-04-17 ENCOUNTER — Other Ambulatory Visit: Payer: Self-pay

## 2022-04-17 ENCOUNTER — Other Ambulatory Visit (HOSPITAL_COMMUNITY): Payer: Self-pay

## 2022-04-19 NOTE — Progress Notes (Signed)
Chief Complaint:   OBESITY Diana Garrison is here to discuss her progress with her obesity treatment plan along with follow-up of her obesity related diagnoses. Diana Garrison is on keeping a food journal and adhering to recommended goals of 1500-1700 calories and 110+ grams of protein and states she is following her eating plan approximately 70% of the time. Diana Garrison states she is doing exercise Video 45 minutes 7 times per week.  Today's visit was #: 6 Starting weight: 335 lbs Starting date: 10/18/2021 Today's weight: 331 lbs Today's date: 04/12/2022 Total lbs lost to date: 4 lbs Total lbs lost since last in-office visit: 1  Interim History: Diana Garrison has done well with weight loss over the past 2 weeks.  She has been able to refocus on her plan. For breakfast she is eating yogurt or egg sandwich with Diana Garrison delightful bread Lunch chicken breast sandwich Dinner air [employer] and vegetables sometimes with little rice but she is aware she needs to limit this Snacks are apples and peanut butter and we did discuss some better high-protein snack options to avoid getting too many calories with too much peanut butter She has been increasing her exercise and she is following a trainer on [place] for 45 minutes at a time doing low impact movements.  She reports that it is difficult to keep up at times but she is very motivated to keep going.  Previous pharmacotherapy included Qsymia  Subjective:   1. Pre-diabetes A1c 5.9 on 10/18/2021.  Started metformin several times, but developed stomach issues each time and stopped taking.  Working on decreasing simple carbohydrates, increasing lean protein and exercise to promote weight loss and improve glycemic control/prevent progression to type 2 diabetes.  2. Vitamin D deficiency On ergocalciferol once weekly-no side effects.  Vitamin D level of 18.3 on 10/18/2021-not at goal.  Assessment/Plan:   1. Pre-diabetes Discontinue metformin due to  intolerance from a GI standpoint.  Continue Prescribed Nutrition Plan and exercise to promote weight loss and to improve glycemic control and prevent progression to type 2 diabetes. 2. Vitamin D deficiency We will refill ergocalciferol once a week for 90 days with 0 refills.  -Refill Vitamin D, Ergocalciferol, (DRISDOL) 1.25 MG (50000 UNIT) CAPS capsule; Take 1 capsule by mouth every 7 days.  Dispense: 5 capsule; Refill: 2  3. Obesity,current BMI 53.5 Diana Garrison is currently in the action stage of change. As such, her goal is to continue with weight loss efforts. She has agreed to keeping a food journal and adhering to recommended goals of 1500-1700 calories and 110+ grams of protein.   Exercise goals: As is.   Behavioral modification strategies: increasing lean protein intake, decreasing simple carbohydrates, meal planning and cooking strategies, better snacking choices, and keeping a strict food journal.  Diana Garrison has agreed to follow-up with our clinic in 2 weeks. She was informed of the importance of frequent follow-up visits to maximize her success with intensive lifestyle modifications for her multiple health conditions.   Objective:   Blood pressure 121/80, pulse 85, temperature 98.4 F (36.9 C), height 5' 6"$  (1.676 m), weight (!) 331 lb (150.1 kg), SpO2 99 %. Body mass index is 53.42 kg/m.  General: Cooperative, alert, well developed, in no acute distress. HEENT: Conjunctivae and lids unremarkable. Cardiovascular: Regular rhythm.  Lungs: Normal work of breathing. Neurologic: No focal deficits.   Lab Results  Component Value Date   CREATININE 0.84 05/11/2021   BUN 9 05/11/2021   NA 137 05/11/2021   K 4.3  05/11/2021   CL 104 05/11/2021   CO2 26 05/11/2021   Lab Results  Component Value Date   ALT 21 05/11/2021   AST 17 05/11/2021   ALKPHOS 95 05/11/2021   BILITOT 0.4 05/11/2021   Lab Results  Component Value Date   HGBA1C 5.9 (H) 10/18/2021   HGBA1C 6.2 05/11/2021    HGBA1C 6.1 09/09/2020   HGBA1C 6.2 (H) 11/14/2019   HGBA1C 5.8 (H) 11/06/2018   No results found for: "INSULIN" Lab Results  Component Value Date   TSH 1.77 05/11/2021   Lab Results  Component Value Date   CHOL 291 (H) 05/11/2021   HDL 39.60 05/11/2021   LDLCALC 160 (H) 11/14/2019   LDLDIRECT 204.0 05/11/2021   TRIG 218.0 (H) 05/11/2021   CHOLHDL 7 05/11/2021   Lab Results  Component Value Date   VD25OH 18.3 (L) 10/18/2021   VD25OH 15.7 (L) 11/14/2019   VD25OH 21.8 (L) 11/27/2018   Lab Results  Component Value Date   WBC 9.2 10/26/2021   HGB 13.5 10/26/2021   HCT 42.3 10/26/2021   MCV 96.1 10/26/2021   PLT 307.0 10/26/2021   Lab Results  Component Value Date   FERRITIN 34 10/18/2021   Attestation Statements:   Reviewed by clinician on day of visit: allergies, medications, problem list, medical history, surgical history, family history, social history, and previous encounter notes.  I, Diana Garrison, am acting as transcriptionist for [employer], [employer].  I have reviewed the above documentation for accuracy and completeness, and I agree with the above. -  Diana Klumpp,PA-C

## 2022-04-25 ENCOUNTER — Other Ambulatory Visit (HOSPITAL_COMMUNITY): Payer: Self-pay

## 2022-04-25 ENCOUNTER — Other Ambulatory Visit: Payer: Self-pay | Admitting: Registered Nurse

## 2022-04-25 ENCOUNTER — Other Ambulatory Visit: Payer: Self-pay

## 2022-04-25 DIAGNOSIS — F331 Major depressive disorder, recurrent, moderate: Secondary | ICD-10-CM

## 2022-04-26 ENCOUNTER — Other Ambulatory Visit: Payer: Self-pay | Admitting: Registered Nurse

## 2022-04-26 ENCOUNTER — Ambulatory Visit (INDEPENDENT_AMBULATORY_CARE_PROVIDER_SITE_OTHER): Payer: 59 | Admitting: Family Medicine

## 2022-04-26 ENCOUNTER — Other Ambulatory Visit: Payer: Self-pay

## 2022-04-26 ENCOUNTER — Encounter (INDEPENDENT_AMBULATORY_CARE_PROVIDER_SITE_OTHER): Payer: Self-pay | Admitting: Family Medicine

## 2022-04-26 VITALS — BP 126/82 | HR 93 | Temp 98.3°F | Ht 66.0 in | Wt 334.0 lb

## 2022-04-26 DIAGNOSIS — Z6841 Body Mass Index (BMI) 40.0 and over, adult: Secondary | ICD-10-CM | POA: Diagnosis not present

## 2022-04-26 DIAGNOSIS — F331 Major depressive disorder, recurrent, moderate: Secondary | ICD-10-CM

## 2022-04-26 DIAGNOSIS — E559 Vitamin D deficiency, unspecified: Secondary | ICD-10-CM

## 2022-04-26 DIAGNOSIS — R7303 Prediabetes: Secondary | ICD-10-CM | POA: Diagnosis not present

## 2022-04-26 MED ORDER — QSYMIA 7.5-46 MG PO CP24: ORAL_CAPSULE | ORAL | 0 refills | Status: DC

## 2022-04-26 NOTE — Assessment & Plan Note (Signed)
Recheck A1c and CMP today.  Last A1C5.9 10/18/2021.  Previously had GI upset from metformin.  Has struggled to see weight loss over the past 6 months of medically supervised weight management.

## 2022-04-26 NOTE — Assessment & Plan Note (Signed)
Reviewed net progress of 1 pound of weight loss in the past 6 months of medically supervised weight management with fair compliance on prescribed dietary plan.  She has also introduce more regular exercise.  Reviewed her bioimpedance results together.  Her motivation level remains high.  She lacks insurance coverage for antiobesity medications.  She has some short-term success on Qsymia without adverse side effect.  Will restart Qsymia at 7.5/46 mg once daily with breakfast.  Will follow her heart rate and blood pressure on Qsymia.  We have reviewed potential adverse side effects.  She is not at risk for pregnancy.  PDMP has been reviewed.

## 2022-04-26 NOTE — Progress Notes (Signed)
Office: [phone]  /  Fax: [phone]  WEIGHT SUMMARY AND BIOMETRICS  Medical Weight Loss Height: 5' 6"$  (1.676 m) Weight: 334 lb (151.5 kg) Temp: 98.3 F (36.8 C) Pulse Rate: 93 BP: 126/82 SpO2: 99 % Fasting: no Labs: no Today's Visit #: 7 Weight at Last VIsit: 331lb Weight Lost Since Last Visit: +3  Body Fat %: 55.7 % Fat Mass (lbs): 186.4 lbs Muscle Mass (lbs): 140.8 lbs Total Body Water (lbs): 107.4 lbs Visceral Fat Rating : 22 Starting Date: 10/18/21 Starting Weight: 335lb Total Weight Loss (lbs): 1 lb (0.454 kg)    HPI  Chief Complaint: OBESITY  Diana Garrison is here to discuss her progress with her obesity treatment plan. She is on the keeping a food journal and adhering to recommended goals of 1500-1700 calories and 110 protein and states she is following her eating plan approximately 70 % of the time. She states she is exercising 30 minutes 7 times per week.   Interval History:  Since last office visit she is up 3 lb.  She has a net weight gain of 1 lb in 6 mos of medically supervised weight management.  She feels like she is 75% on her meal plan.  Denies big portions.  Getting in more vegetables.  Air frying at home.  Has been drinking a [employer] shake once daily.    She is fearful of bariatric surgery since her sister died from complications following surgery.     Pharmacotherapy: none  PHYSICAL EXAM:  Blood pressure 126/82, pulse 93, temperature 98.3 F (36.8 C), height 5' 6"$  (1.676 m), weight (!) 334 lb (151.5 kg), SpO2 99 %. Body mass index is 53.91 kg/m.  General: She is overweight, cooperative, alert, well developed, and in no acute distress. PSYCH: Has normal mood, affect and thought process.   HEENT: EOMI, sclerae are anicteric. Lungs: Normal breathing effort, no conversational dyspnea. Extremities: No edema.  Neurologic: No gross sensory or motor deficits. No tremors or fasciculations noted.    DIAGNOSTIC DATA REVIEWED:  BMET     Component Value Date/Time   NA 137 05/11/2021 0849   NA 139 11/14/2019 0825   K 4.3 05/11/2021 0849   CL 104 05/11/2021 0849   CO2 26 05/11/2021 0849   GLUCOSE 100 (H) 05/11/2021 0849   BUN 9 05/11/2021 0849   BUN 11 11/14/2019 0825   CREATININE 0.84 05/11/2021 0849   CALCIUM 9.3 05/11/2021 0849   GFRNONAA 99 11/14/2019 0825   GFRAA 115 11/14/2019 0825   Lab Results  Component Value Date   HGBA1C 5.9 (H) 10/18/2021   HGBA1C 5.6 10/09/2017   No results found for: "INSULIN" Lab Results  Component Value Date   TSH 1.77 05/11/2021   CBC    Component Value Date/Time   WBC 9.2 10/26/2021 1129   RBC 4.40 10/26/2021 1129   HGB 13.5 10/26/2021 1129   HGB 13.7 11/14/2019 0825   HCT 42.3 10/26/2021 1129   HCT 43.8 11/14/2019 0825   PLT 307.0 10/26/2021 1129   PLT 276 11/06/2018 1649   MCV 96.1 10/26/2021 1129   MCV 94 11/14/2019 0825   MCH 29.3 11/14/2019 0825   MCHC 32.0 10/26/2021 1129   RDW 15.3 10/26/2021 1129   RDW 15.4 11/14/2019 0825   Iron Studies    Component Value Date/Time   FERRITIN 34 10/18/2021 1430   Lipid Panel     Component Value Date/Time   CHOL 291 (H) 05/11/2021 0849   CHOL 227 (H) 11/14/2019 0825  TRIG 218.0 (H) 05/11/2021 0849   HDL 39.60 05/11/2021 0849   HDL 50 11/14/2019 0825   CHOLHDL 7 05/11/2021 0849   VLDL 43.6 (H) 05/11/2021 0849   LDLCALC 160 (H) 11/14/2019 0825   LDLDIRECT 204.0 05/11/2021 0849   Hepatic Function Panel     Component Value Date/Time   PROT 6.6 05/11/2021 0849   PROT 6.5 11/14/2019 0825   ALBUMIN 4.0 05/11/2021 0849   ALBUMIN 4.0 11/14/2019 0825   AST 17 05/11/2021 0849   ALT 21 05/11/2021 0849   ALKPHOS 95 05/11/2021 0849   BILITOT 0.4 05/11/2021 0849   BILITOT <0.2 11/14/2019 0825      Component Value Date/Time   TSH 1.77 05/11/2021 0849   Nutritional Lab Results  Component Value Date   VD25OH 18.3 (L) 10/18/2021   VD25OH 15.7 (L) 11/14/2019   VD25OH 21.8 (L) 11/27/2018     ASSESSMENT AND  PLAN  TREATMENT PLAN FOR OBESITY:  Recommended Dietary Goals  Diana Garrison is currently in the action stage of change. As such, her goal is to continue weight management plan. She has agreed to keeping a food journal and adhering to recommended goals of 1500-1700 calories and 110 protein.  Behavioral Intervention  We discussed the following Behavioral Modification Strategies today: increasing lean protein intake, increasing vegetables, increase water intake, work on meal planning and easy cooking plans, and think about ways to increase physical activity.  Additional resources provided today: NA  Recommended Physical Activity Goals  [name] has been advised to work up to 150 minutes of moderate intensity aerobic activity a week and strengthening exercises 2-3 times per week for cardiovascular health, weight loss maintenance and preservation of muscle mass.   She has agreed to increase physical activity in their day and reduce sedentary time (increase NEAT).  and Will continue to do regular resistance exercise 3 or more days per week.    Pharmacotherapy We discussed various medication options to help [name] with her weight loss efforts and we both agreed to Qsymia.  ASSOCIATED CONDITIONS ADDRESSED TODAY  Vitamin D deficiency Assessment & Plan: Reviewed last vitamin D from 10/18/2021 at 18.3 with a target range of 50-70.  She has been taking prescription vitamin D 50,000 IU once weekly.  She is due for vitamin D level today.  Energy level is improving  Orders: -     VITAMIN D 25 Hydroxy (Vit-D Deficiency, Fractures)  Pre-diabetes Assessment & Plan: Recheck A1c and CMP today.  Last A1C5.9 10/18/2021.  Previously had GI upset from metformin.  Has struggled to see weight loss over the past 6 months of medically supervised weight management.  Orders: -     Hemoglobin A1c -     Comprehensive metabolic panel  Morbid obesity (HCC) Assessment & Plan: Reviewed net progress of 1 pound of  weight loss in the past 6 months of medically supervised weight management with fair compliance on prescribed dietary plan.  She has also introduce more regular exercise.  Reviewed her bioimpedance results together.  Her motivation level remains high.  She lacks insurance coverage for antiobesity medications.  She has some short-term success on Qsymia without adverse side effect.  Will restart Qsymia at 7.5/46 mg once daily with breakfast.  Will follow her heart rate and blood pressure on Qsymia.  We have reviewed potential adverse side effects.  She is not at risk for pregnancy.  PDMP has been reviewed.  Orders: -     Qsymia; 1 capsule po qAM  Dispense: 30 capsule;  Refill: 0  BMI 50.0-59.9, adult ([place])      Return in about 4 weeks (around 05/24/2022).Marland Kitchen She was informed of the importance of frequent follow up visits to maximize her success with intensive lifestyle modifications for her multiple health conditions.   ATTESTASTION STATEMENTS:  Reviewed by clinician on day of visit: allergies, medications, problem list, medical history, surgical history, family history, social history, and previous encounter notes.   Time spent on visit including pre-visit chart review and post-visit care and charting was 30 minutes.    Dell Ponto, DO

## 2022-04-26 NOTE — Assessment & Plan Note (Signed)
Reviewed last vitamin D from 10/18/2021 at 18.3 with a target range of 50-70.  She has been taking prescription vitamin D 50,000 IU once weekly.  She is due for vitamin D level today.  Energy level is improving

## 2022-04-27 LAB — COMPREHENSIVE METABOLIC PANEL
ALT: 26 IU/L (ref 0–32)
AST: 16 IU/L (ref 0–40)
Albumin/Globulin Ratio: 1.6 (ref 1.2–2.2)
Albumin: 4.1 g/dL (ref 3.8–4.9)
Alkaline Phosphatase: 106 IU/L (ref 44–121)
BUN/Creatinine Ratio: 18 (ref 9–23)
BUN: 14 mg/dL (ref 6–24)
Bilirubin Total: 0.3 mg/dL (ref 0.0–1.2)
CO2: 24 mmol/L (ref 20–29)
Calcium: 9.6 mg/dL (ref 8.7–10.2)
Chloride: 104 mmol/L (ref 96–106)
Creatinine, Ser: 0.76 mg/dL (ref 0.57–1.00)
Globulin, Total: 2.5 g/dL (ref 1.5–4.5)
Glucose: 111 mg/dL — ABNORMAL HIGH (ref 70–99)
Potassium: 4.3 mmol/L (ref 3.5–5.2)
Sodium: 142 mmol/L (ref 134–144)
Total Protein: 6.6 g/dL (ref 6.0–8.5)
eGFR: 95 mL/min/{1.73_m2} (ref >59)

## 2022-04-27 LAB — VITAMIN D 25 HYDROXY (VIT D DEFICIENCY, FRACTURES): Vit D, 25-Hydroxy: 20.6 ng/mL — ABNORMAL LOW (ref 30.0–100.0)

## 2022-04-27 LAB — HEMOGLOBIN A1C
Est. average glucose Bld gHb Est-mCnc: 126 mg/dL
Hgb A1c MFr Bld: 6 % — ABNORMAL HIGH (ref 4.8–5.6)

## 2022-05-01 ENCOUNTER — Encounter (INDEPENDENT_AMBULATORY_CARE_PROVIDER_SITE_OTHER): Payer: Self-pay

## 2022-05-02 ENCOUNTER — Other Ambulatory Visit (INDEPENDENT_AMBULATORY_CARE_PROVIDER_SITE_OTHER): Payer: Self-pay | Admitting: Family Medicine

## 2022-05-02 ENCOUNTER — Other Ambulatory Visit (HOSPITAL_COMMUNITY): Payer: Self-pay

## 2022-05-02 MED ORDER — QSYMIA 7.5-46 MG PO CP24: ORAL_CAPSULE | ORAL | 0 refills | Status: DC

## 2022-05-05 DIAGNOSIS — G4733 Obstructive sleep apnea (adult) (pediatric): Secondary | ICD-10-CM | POA: Diagnosis not present

## 2022-05-09 DIAGNOSIS — G4733 Obstructive sleep apnea (adult) (pediatric): Secondary | ICD-10-CM | POA: Diagnosis not present

## 2022-05-12 ENCOUNTER — Other Ambulatory Visit (HOSPITAL_COMMUNITY): Payer: Self-pay

## 2022-05-12 MED ORDER — DICLOFENAC SODIUM 75 MG PO TBEC
75.0000 mg | DELAYED_RELEASE_TABLET | Freq: Two times a day (BID) | ORAL | 0 refills | Status: DC | PRN
  Filled 2022-05-12: qty 60, 30d supply, fill #0

## 2022-05-16 ENCOUNTER — Encounter (INDEPENDENT_AMBULATORY_CARE_PROVIDER_SITE_OTHER): Payer: Self-pay | Admitting: Family Medicine

## 2022-05-16 ENCOUNTER — Ambulatory Visit (INDEPENDENT_AMBULATORY_CARE_PROVIDER_SITE_OTHER): Payer: 59 | Admitting: Family Medicine

## 2022-05-16 VITALS — BP 126/76 | HR 91 | Temp 98.4°F | Ht 66.0 in | Wt 334.0 lb

## 2022-05-16 DIAGNOSIS — R632 Polyphagia: Secondary | ICD-10-CM | POA: Diagnosis not present

## 2022-05-16 DIAGNOSIS — E559 Vitamin D deficiency, unspecified: Secondary | ICD-10-CM | POA: Diagnosis not present

## 2022-05-16 DIAGNOSIS — R7303 Prediabetes: Secondary | ICD-10-CM | POA: Diagnosis not present

## 2022-05-16 DIAGNOSIS — Z6841 Body Mass Index (BMI) 40.0 and over, adult: Secondary | ICD-10-CM

## 2022-05-16 MED ORDER — VITAMIN D (ERGOCALCIFEROL) 1.25 MG (50000 UNIT) PO CAPS
50000.0000 [IU] | ORAL_CAPSULE | ORAL | 0 refills | Status: DC
  Filled 2022-05-16: qty 5, 35d supply, fill #0

## 2022-05-16 MED ORDER — QSYMIA 7.5-46 MG PO CP24
1.0000 | ORAL_CAPSULE | Freq: Every morning | ORAL | 0 refills | Status: DC
  Filled 2022-05-16 – 2022-06-02 (×3): qty 30, 30d supply, fill #0

## 2022-05-16 NOTE — Assessment & Plan Note (Signed)
Polyphasia has been a barrier to patient's progress with recommended meals and snacks.  She started Qsymia 7.5/46 mg just 4 days ago.  She denies any adverse side effects.  Anticipate improvements in appetite control and weight loss over the next month.  Will continue Qsymia 7.5/46 mg once daily, working on a prior authorization.  Will closely follow blood pressure and heart rate.  Continue to aim for 120 g of dietary protein intake daily

## 2022-05-16 NOTE — Progress Notes (Signed)
Office: [phone]  /  Fax: [phone]  WEIGHT SUMMARY AND BIOMETRICS  Vitals Temp: 98.4 F (36.9 C) BP: 126/76 Pulse Rate: 91 SpO2: 98 %   Anthropometric Measurements Height: '5\' 6"'$  (1.676 m) Weight: (!) 334 lb (151.5 kg) BMI (Calculated): 53.93 Weight at Last Visit: 334lb Weight Lost Since Last Visit: 0lb Starting Weight: 335lb Total Weight Loss (lbs): 1 lb (0.454 kg)   Body Composition  Body Fat %: 55.8 % Fat Mass (lbs): 186.8 lbs Muscle Mass (lbs): 140.4 lbs Total Body Water (lbs): 109 lbs Visceral Fat Rating : 22   Other Clinical Data Fasting: no Labs: no Today's Visit #: 8 Starting Date: 10/18/21     HPI  Chief Complaint: OBESITY  Zyana is here to discuss her progress with her obesity treatment plan. She is on the the Category 2 Plan and states she is following her eating plan approximately 80 % of the time. She states she is exercising 40 minutes 3-4 times per week.   Interval History:  Since last office visit she is the same weight as last visit. She is picking better food choices Denies large portion sizes She is having 2 snacks per day - usually a greek yogurt  She bought a Probation officer and started to use but her knee hurt She started using resistance bands Started Qsymia 4 days ago - no problems  Information given on [employer] well gym with punch card program for $99  -Encourage water exercise classes every Monday  Pharmacotherapy: Qsymia   PHYSICAL EXAM:  Blood pressure 126/76, pulse 91, temperature 98.4 F (36.9 C), height '5\' 6"'$  (1.676 m), weight (!) 334 lb (151.5 kg), SpO2 98 %. Body mass index is 53.91 kg/m.  General: She is overweight, cooperative, alert, well developed, and in no acute distress. PSYCH: Has normal mood, affect and thought process.   Lungs: Normal breathing effort, no conversational dyspnea.  DIAGNOSTIC DATA REVIEWED:  BMET    Component Value Date/Time   NA 142 04/26/2022 1441   K 4.3  04/26/2022 1441   CL 104 04/26/2022 1441   CO2 24 04/26/2022 1441   GLUCOSE 111 (H) 04/26/2022 1441   GLUCOSE 100 (H) 05/11/2021 0849   BUN 14 04/26/2022 1441   CREATININE 0.76 04/26/2022 1441   CALCIUM 9.6 04/26/2022 1441   GFRNONAA 99 11/14/2019 0825   GFRAA 115 11/14/2019 0825   Lab Results  Component Value Date   HGBA1C 6.0 (H) 04/26/2022   HGBA1C 5.6 10/09/2017   No results found for: "INSULIN" Lab Results  Component Value Date   TSH 1.77 05/11/2021   CBC    Component Value Date/Time   WBC 9.2 10/26/2021 1129   RBC 4.40 10/26/2021 1129   HGB 13.5 10/26/2021 1129   HGB 13.7 11/14/2019 0825   HCT 42.3 10/26/2021 1129   HCT 43.8 11/14/2019 0825   PLT 307.0 10/26/2021 1129   PLT 276 11/06/2018 1649   MCV 96.1 10/26/2021 1129   MCV 94 11/14/2019 0825   MCH 29.3 11/14/2019 0825   MCHC 32.0 10/26/2021 1129   RDW 15.3 10/26/2021 1129   RDW 15.4 11/14/2019 0825   Iron Studies    Component Value Date/Time   FERRITIN 34 10/18/2021 1430   Lipid Panel     Component Value Date/Time   CHOL 291 (H) 05/11/2021 0849   CHOL 227 (H) 11/14/2019 0825   TRIG 218.0 (H) 05/11/2021 0849   HDL 39.60 05/11/2021 0849   HDL 50 11/14/2019 0825  CHOLHDL 7 05/11/2021 0849   VLDL 43.6 (H) 05/11/2021 0849   LDLCALC 160 (H) 11/14/2019 0825   LDLDIRECT 204.0 05/11/2021 0849   Hepatic Function Panel     Component Value Date/Time   PROT 6.6 04/26/2022 1441   ALBUMIN 4.1 04/26/2022 1441   AST 16 04/26/2022 1441   ALT 26 04/26/2022 1441   ALKPHOS 106 04/26/2022 1441   BILITOT 0.3 04/26/2022 1441      Component Value Date/Time   TSH 1.77 05/11/2021 0849   Nutritional Lab Results  Component Value Date   VD25OH 20.6 (L) 04/26/2022   VD25OH 18.3 (L) 10/18/2021   VD25OH 15.7 (L) 11/14/2019     ASSESSMENT AND PLAN  TREATMENT PLAN FOR OBESITY:  Recommended Dietary Goals  Diana Garrison is currently in the action stage of change. As such, her goal is to continue weight  management plan. She has agreed to keeping a food journal and adhering to recommended goals of 1600 calories and 120 g protein.  Behavioral Intervention  We discussed the following Behavioral Modification Strategies today: increasing lean protein intake, increasing vegetables, increasing water intake, work on meal planning and easy cooking plans, decreasing eating out, consumption of processed foods, and making healthy choices when eating convenient foods, and reading food labels .  Additional resources provided today: NA  Recommended Physical Activity Goals  Keriann has been advised to work up to 150 minutes of moderate intensity aerobic activity a week and strengthening exercises 2-3 times per week for cardiovascular health, weight loss maintenance and preservation of muscle mass.   She has agreed to Will begin regular aerobic exercise 30 minutes, 3-4 times per week. Chosen activity water exercise or walking.   Pharmacotherapy We discussed various medication options to help Kamiah with her weight loss efforts and we both agreed to Qsymia.  ASSOCIATED CONDITIONS ADDRESSED TODAY  Vitamin D deficiency Assessment & Plan: Last vitamin D Lab Results  Component Value Date   VD25OH 20.6 (L) 04/26/2022   Reviewed labs from last visit including a vitamin D of 20.6, slightly up from 18.3 previously.  She reports compliance on prescription vitamin D 50,000 IU once weekly.  Energy level is starting to improve.  Will continue her prescription vitamin D 50,000 IU once weekly and recheck level in 3 to 4 months.  Orders: -     Vitamin D (Ergocalciferol); Take 1 capsule (50,000 Units total) by mouth every 7 (seven) days.  Dispense: 5 capsule; Refill: 0  Morbid obesity ([place]) Assessment & Plan: Stable Patient has a net weight gain of 1 pound in the last 7 months of medically supervised weight management She has struggled with compliance with recommended diet and exercise changes. Recently  started antiobesity medication 4 days ago in the form of Qsymia.  Will be following along for progress.  Anticipate at least 5% total body weight loss in the next 3 months.  Orders: -     Qsymia; 1 capsule po qAM  Dispense: 30 capsule; Refill: 0  BMI 50.0-59.9, adult [facility])  Pre-diabetes Assessment & Plan: Lab Results  Component Value Date   HGBA1C 6.0 (H) 04/26/2022   Review labs from last visit.  A1c worsened from 5.9-6.0.  Patient has had struggles complying with dietary changes and regular exercise.  She has a history of metformin intolerance.  Consider the addition of a GLP-1 receptor agonist for both prediabetes and obesity management based on cost in the future.  Continue prescribed meal plan, reducing added sugar intake.   Polyphagia Assessment &  Plan: Polyphasia has been a barrier to patient's progress with recommended meals and snacks.  She started Qsymia 7.5/46 mg just 4 days ago.  She denies any adverse side effects.  Anticipate improvements in appetite control and weight loss over the next month.  Will continue Qsymia 7.5/46 mg once daily, working on a prior authorization.  Will closely follow blood pressure and heart rate.  Continue to aim for 120 g of dietary protein intake daily  Orders: -     Qsymia; 1 capsule po qAM  Dispense: 30 capsule; Refill: 0      No follow-ups on file.Marland Kitchen She was informed of the importance of frequent follow up visits to maximize her success with intensive lifestyle modifications for her multiple health conditions.   ATTESTASTION STATEMENTS:  Reviewed by clinician on day of visit: allergies, medications, problem list, medical history, surgical history, family history, social history, and previous encounter notes pertinent to obesity diagnosis.   I have personally spent 30 minutes total time today in preparation, patient care, nutritional counseling and documentation for this visit, including the following: review of clinical lab tests;  review of medical tests/procedures/services.      Dell Ponto, DO

## 2022-05-16 NOTE — Assessment & Plan Note (Signed)
Lab Results  Component Value Date   HGBA1C 6.0 (H) 04/26/2022   Review labs from last visit.  A1c worsened from 5.9-6.0.  Patient has had struggles complying with dietary changes and regular exercise.  She has a history of metformin intolerance.  Consider the addition of a GLP-1 receptor agonist for both prediabetes and obesity management based on cost in the future.  Continue prescribed meal plan, reducing added sugar intake.

## 2022-05-16 NOTE — Assessment & Plan Note (Signed)
Last vitamin D Lab Results  Component Value Date   VD25OH 20.6 (L) 04/26/2022   Reviewed labs from last visit including a vitamin D of 20.6, slightly up from 18.3 previously.  She reports compliance on prescription vitamin D 50,000 IU once weekly.  Energy level is starting to improve.  Will continue her prescription vitamin D 50,000 IU once weekly and recheck level in 3 to 4 months.

## 2022-05-16 NOTE — Assessment & Plan Note (Signed)
Stable Patient has a net weight gain of 1 pound in the last 7 months of medically supervised weight management She has struggled with compliance with recommended diet and exercise changes. Recently started antiobesity medication 4 days ago in the form of Qsymia.  Will be following along for progress.  Anticipate at least 5% total body weight loss in the next 3 months.

## 2022-05-17 ENCOUNTER — Other Ambulatory Visit (HOSPITAL_COMMUNITY): Payer: Self-pay

## 2022-05-17 ENCOUNTER — Other Ambulatory Visit: Payer: Self-pay

## 2022-05-18 ENCOUNTER — Other Ambulatory Visit (HOSPITAL_COMMUNITY): Payer: Self-pay

## 2022-05-29 ENCOUNTER — Telehealth (INDEPENDENT_AMBULATORY_CARE_PROVIDER_SITE_OTHER): Payer: Self-pay | Admitting: Family Medicine

## 2022-05-29 ENCOUNTER — Encounter (INDEPENDENT_AMBULATORY_CARE_PROVIDER_SITE_OTHER): Payer: Self-pay | Admitting: *Deleted

## 2022-05-29 ENCOUNTER — Other Ambulatory Visit (HOSPITAL_COMMUNITY): Payer: Self-pay

## 2022-05-29 NOTE — Telephone Encounter (Deleted)
Prior authorization done for patients Qsymia via cover my meds. Waiting on determination.  (Key: [id number]) PA Case ID #: [id number] Rx #: [id number]

## 2022-05-29 NOTE — Telephone Encounter (Signed)
Prior authorization done via cover my meds for Patients Qsymia. Waiting on determination.  (Key: [id number]) PA Case ID #: [id number] Rx #: [id number]

## 2022-05-30 ENCOUNTER — Other Ambulatory Visit: Payer: Self-pay | Admitting: Family Medicine

## 2022-05-30 DIAGNOSIS — Z1231 Encounter for screening mammogram for malignant neoplasm of breast: Secondary | ICD-10-CM

## 2022-05-31 ENCOUNTER — Telehealth: Payer: Self-pay | Admitting: Family Medicine

## 2022-05-31 NOTE — Telephone Encounter (Signed)
Caller Name: Nanyamka Call back phone #: [phone]   MEDICATION(S):  Flexeril   [facility]erred [facility]  Pt is requesting a change in this med to to toridal.  ~~~Please advise patient/caregiver to allow 2-3 business days to process RX refills.

## 2022-05-31 NOTE — Telephone Encounter (Signed)
Called patient and advised that an appointment is needed do to never prescribed by provider. Scheduled an appointment for Friday. Dm/cma

## 2022-06-01 NOTE — Telephone Encounter (Signed)
Prior authorization approved for patients Qsymia through 05/31/2022-05/30/2023.

## 2022-06-02 ENCOUNTER — Ambulatory Visit: Payer: 59 | Admitting: Family Medicine

## 2022-06-02 ENCOUNTER — Encounter: Payer: Self-pay | Admitting: Family Medicine

## 2022-06-02 ENCOUNTER — Other Ambulatory Visit: Payer: Self-pay

## 2022-06-02 ENCOUNTER — Other Ambulatory Visit (HOSPITAL_COMMUNITY): Payer: Self-pay

## 2022-06-02 VITALS — BP 124/80 | HR 84 | Temp 98.2°F | Ht 66.0 in | Wt 337.8 lb

## 2022-06-02 DIAGNOSIS — Z6841 Body Mass Index (BMI) 40.0 and over, adult: Secondary | ICD-10-CM | POA: Diagnosis not present

## 2022-06-02 DIAGNOSIS — T07XXXA Unspecified multiple injuries, initial encounter: Secondary | ICD-10-CM | POA: Diagnosis not present

## 2022-06-02 DIAGNOSIS — E559 Vitamin D deficiency, unspecified: Secondary | ICD-10-CM | POA: Diagnosis not present

## 2022-06-02 MED ORDER — VITAMIN D (ERGOCALCIFEROL) 1.25 MG (50000 UNIT) PO CAPS
50000.0000 [IU] | ORAL_CAPSULE | ORAL | 2 refills | Status: DC
  Filled 2022-06-02: qty 5, 35d supply, fill #0

## 2022-06-02 MED ORDER — CYCLOBENZAPRINE HCL 10 MG PO TABS
5.0000 mg | ORAL_TABLET | Freq: Three times a day (TID) | ORAL | 1 refills | Status: DC | PRN
  Filled 2022-06-02: qty 60, 20d supply, fill #0
  Filled 2022-10-02: qty 60, 20d supply, fill #1

## 2022-06-02 NOTE — Progress Notes (Signed)
[facility] [address] [place] [place] [address] Dept: [phone] Dept Fax: [phone]  Office Visit  Subjective:    Patient ID: Diana Garrison, female    DOB: March 09, 1971, 52 y.o..   MRN: [id number]  Chief Complaint  Patient presents with   Medical Management of Chronic Issues   History of Present Illness:  Patient is in today having had a fall at home 2 days ago. She notes she got tripped up. She fell against a Ecologist. She has some pain in the left pectoral area near her shoulder. She also contused her right knee. She has a chronic meniscal tear of the right knee that is being managed with gel injections. Orthopedics has told her that this is not severe enough to warrant surgery at this point.  Ms. Ingrim has a history of obesity. She is working with the [employer]. She describes multiple dietary efforts she has made to improve her diet and reduce calories. she is looking into water aerobics to also increase her physical activity in a way that her knee will allow. She is also on Qsymia. She feels she is tolerating this well.  Past Medical History: Patient Active Problem List   Diagnosis Date Noted   Polyphagia 05/16/2022   Morbid obesity with BMI of 50.0-59.9, adult ([place]) 01/03/2022   Pre-diabetes 12/05/2021   Other hyperlipidemia 11/01/2021   Depression 11/01/2021   Seasonal allergic rhinitis 10/26/2021   Hyperlipidemia 10/26/2021   Vitamin D deficiency 10/20/2021   Moderate episode of recurrent major depressive disorder ([place]) 08/16/2021   Right knee meniscal tear 08/16/2021   Perimenopausal 08/16/2021   Spider veins of both lower extremities 10/17/2016   Menorrhagia 06/06/2013   Tobacco use disorder 06/06/2013   Past Surgical History:  Procedure Laterality Date   CESAREAN SECTION N/A    [employer] 11/12/2019   GANGLION CYST EXCISION Bilateral    x2   Family History  Problem Relation Age of Onset    Obesity Father    Cirrhosis Father    Pulmonary embolism Sister    Lupus Brother    Breast cancer Neg Hx    Outpatient Medications Prior to Visit  Medication Sig Dispense Refill   B Complex Vitamins (VITAMIN B COMPLEX PO) Take by mouth.     diclofenac (VOLTAREN) 75 MG EC tablet Take 1 tablet by mouth twice a day as needed for pain 60 tablet 0   magnesium 30 MG tablet Take 1 tablet (30 mg total) by mouth 2 (two) times daily. 60 tablet 0   meloxicam (MOBIC) 15 MG tablet Take 1 tablet (15 mg total) by mouth daily. 90 tablet 1   Phentermine-Topiramate (QSYMIA) 7.5-46 MG CP24 Take 1 capsule by mouth every morning. 30 capsule 0   rosuvastatin (CRESTOR) 20 MG tablet Take 1 tablet by mouth daily. 90 tablet 3   zinc gluconate 50 MG tablet Take 1 tablet (50 mg total) by mouth daily. 30 tablet 0   cyclobenzaprine (FLEXERIL) 10 MG tablet Take 1/2-1 tablet (5-10 mg total) by mouth 3 (three) times daily as needed for muscle spasms. 30 tablet 0   Vitamin D, Ergocalciferol, (DRISDOL) 1.25 MG (50000 UNIT) CAPS capsule Take 1 capsule (50,000 Units total) by mouth every 7 (seven) days. 5 capsule 0   diclofenac Sodium (VOLTAREN) 1 % GEL Apply 2 grams topically 4 times daily as directed. 150 g 1   diclofenac Sodium (VOLTAREN) 1 % GEL Apply 2-4 g topically 4 (four) times daily  as needed for pain. 300 g 3   No facility-administered medications prior to visit.   Allergies  Allergen Reactions   Percocet [Oxycodone-Acetaminophen] Other (See Comments)    Hallucinations, paranoia     Objective:   Today's Vitals   06/02/22 1022  BP: 124/80  Pulse: 84  Temp: 98.2 F (36.8 C)  TempSrc: Temporal  SpO2: 96%  Weight: (!) 337 lb 12.8 oz (153.2 kg)  Height: 5\' 6"  (1.676 m)   Body mass index is 54.52 kg/m.   General: Well developed, well nourished. No acute distress. Extremities: Full ROM of left shoulder and right knee. No joint swelling. No bruising noted. Mild   increased pain int he left pectoral area  with resisted anterior adduction. Psych: Alert and oriented. Normal mood and affect.  Health Maintenance Due  Topic Date Due   PAP SMEAR-Modifier  04/18/2022   Lab Results: Component Ref Range & Units 1 mo ago 7 mo ago 2 yr ago 3 yr ago  Vit D, 25-Hydroxy 30.0 - 100.0 ng/mL 20.6 Low  18.3 Low  CM 15.7 Low  CM 21.8 Low  CM   Assessment & Plan:   Problem List Items Addressed This Visit       Other   Vitamin D deficiency    Vitamin D has remained low on recent labs. I will renew her Vit D at a replacement dose for 3 months.      Relevant Medications   Vitamin D, Ergocalciferol, (DRISDOL) 1.25 MG (50000 UNIT) CAPS capsule   Morbid obesity with BMI of 50.0-59.9, adult ([place])    Encouraged her to continue her current efforts with improved diet, Qsymia, and looking at joining water aerobics.      Other Visit Diagnoses     Multiple contusions    -  Primary   Injuries should improve over the next week. I will renew her cyclobenzaprine, as this shoudl at least help with sleep.   Relevant Medications   cyclobenzaprine (FLEXERIL) 10 MG tablet       Return in about 6 months (around 12/03/2022) for Reassessment.   Haydee Salter, MD

## 2022-06-02 NOTE — Assessment & Plan Note (Signed)
Vitamin D has remained low on recent labs. I will renew her Vit D at a replacement dose for 3 months.

## 2022-06-02 NOTE — Assessment & Plan Note (Signed)
Encouraged her to continue her current efforts with improved diet, Qsymia, and looking at joining water aerobics.

## 2022-06-03 ENCOUNTER — Other Ambulatory Visit (HOSPITAL_COMMUNITY): Payer: Self-pay

## 2022-06-03 DIAGNOSIS — G4733 Obstructive sleep apnea (adult) (pediatric): Secondary | ICD-10-CM | POA: Diagnosis not present

## 2022-06-07 DIAGNOSIS — G4733 Obstructive sleep apnea (adult) (pediatric): Secondary | ICD-10-CM | POA: Diagnosis not present

## 2022-06-12 ENCOUNTER — Other Ambulatory Visit (HOSPITAL_COMMUNITY): Payer: Self-pay

## 2022-06-20 ENCOUNTER — Ambulatory Visit (INDEPENDENT_AMBULATORY_CARE_PROVIDER_SITE_OTHER): Payer: 59 | Admitting: Family Medicine

## 2022-06-27 ENCOUNTER — Ambulatory Visit (INDEPENDENT_AMBULATORY_CARE_PROVIDER_SITE_OTHER): Payer: 59 | Admitting: Family Medicine

## 2022-06-27 ENCOUNTER — Other Ambulatory Visit (HOSPITAL_COMMUNITY): Payer: Self-pay

## 2022-06-27 ENCOUNTER — Encounter (INDEPENDENT_AMBULATORY_CARE_PROVIDER_SITE_OTHER): Payer: Self-pay | Admitting: Family Medicine

## 2022-06-27 VITALS — BP 115/87 | HR 80 | Temp 98.6°F | Ht 66.0 in | Wt 333.0 lb

## 2022-06-27 DIAGNOSIS — R7303 Prediabetes: Secondary | ICD-10-CM

## 2022-06-27 DIAGNOSIS — G4733 Obstructive sleep apnea (adult) (pediatric): Secondary | ICD-10-CM | POA: Diagnosis not present

## 2022-06-27 DIAGNOSIS — E559 Vitamin D deficiency, unspecified: Secondary | ICD-10-CM

## 2022-06-27 DIAGNOSIS — R632 Polyphagia: Secondary | ICD-10-CM

## 2022-06-27 DIAGNOSIS — Z6841 Body Mass Index (BMI) 40.0 and over, adult: Secondary | ICD-10-CM | POA: Diagnosis not present

## 2022-06-27 MED ORDER — QSYMIA 7.5-46 MG PO CP24
1.0000 | ORAL_CAPSULE | Freq: Every morning | ORAL | 0 refills | Status: DC
  Filled 2022-06-27 – 2022-07-07 (×2): qty 30, 30d supply, fill #0

## 2022-06-27 MED ORDER — VITAMIN D (ERGOCALCIFEROL) 1.25 MG (50000 UNIT) PO CAPS
50000.0000 [IU] | ORAL_CAPSULE | ORAL | 2 refills | Status: DC
  Filled 2022-06-27 – 2022-07-07 (×2): qty 5, 35d supply, fill #0

## 2022-06-27 NOTE — Assessment & Plan Note (Signed)
Polyphasia has improved on Qsymia 7.5/46 mg once daily, eating on a schedule and increasing intake of lean protein and fiber with meals.  She is happy with her results thus far.  Her blood pressure and heart rate are within normal limits on Qsymia and she is not at risk for pregnancy.  Continue Qsymia 7.5/46 mg once daily. Recheck chemistry panel with labs in 2 months

## 2022-06-27 NOTE — Assessment & Plan Note (Signed)
Lab Results  Component Value Date   HGBA1C 6.0 (H) 04/26/2022   History of metformin intolerance.  Patient is actively working on reducing her intake of added sugar and refined carbohydrates.  She has started reading labels for added sugar, limiting products with over 8 g of sugar per serving.  She has increased her walking time.  Plan to recheck fasting insulin and A1c in 2 months

## 2022-06-27 NOTE — Assessment & Plan Note (Signed)
Last vitamin D Lab Results  Component Value Date   VD25OH 20.6 (L) 04/26/2022   Taking ergocalciferol 50,000 IU once weekly.  Energy level is starting to improve.  Denies adverse side effects.  Refilled ergocalciferol 50,000 IU once weekly.  Recheck level in 2 months

## 2022-06-27 NOTE — Progress Notes (Signed)
Office: [phone]  /  Fax: [phone]  WEIGHT SUMMARY AND BIOMETRICS  Starting Date: 10/18/21  Starting Weight: 335lb   Weight Lost Since Last Visit: 1lb   Vitals Temp: 98.6 F (37 C) BP: 115/87 Pulse Rate: 80 SpO2: 99 %   Body Composition  Body Fat %: 55.3 % Fat Mass (lbs): 184.4 lbs Muscle Mass (lbs): 141.6 lbs Total Body Water (lbs): 108.6 lbs Visceral Fat Rating : 22   HPI  Chief Complaint: OBESITY  Diana Garrison is here to discuss her progress with her obesity treatment plan. She is on the the Category 2 Plan and states she is following her eating plan approximately 90 % of the time. She states she is exercising 20 minutes 4 times per week.   Interval History:  Since last office visit she is down 1 lb She is up 1.2 lb of muscle and is down 2.4 lb of body fat She has is down 2 lb in the past 8 mos She is trying to not snack in the middle of the night She fights with her CPAP- has tried all different masks She is doing well on Qsymia 7.5 / 46 mg every morning It has helped her get off soda.  Cravings and appetite have improved She has increased her walking time She added in chair exercise  Pharmacotherapy: Qsymia 7.5/46 mg daily  PHYSICAL EXAM:  Blood pressure 115/87, pulse 80, temperature 98.6 F (37 C), height  (1.676 m), weight (!) 333 lb (151 kg), SpO2 99 %. Body mass index is 53.75 kg/m.  General: She is overweight, cooperative, alert, well developed, and in no acute distress. PSYCH: Has normal mood, affect and thought process.   Lungs: Normal breathing effort, no conversational dyspnea.   ASSESSMENT AND PLAN  TREATMENT PLAN FOR OBESITY:  Recommended Dietary Goals  Pamlea is currently in the action stage of change. As such, her goal is to continue weight management plan. She has agreed to the Category 3 Plan.  Behavioral Intervention  We discussed the following Behavioral Modification Strategies today: increasing lean protein  intake, increasing vegetables, increasing lower glycemic fruits, increasing fiber rich foods, avoiding skipping meals, increasing water intake, work on meal planning and preparation, work on tracking and journaling calories using tracking application, reading food labels , identifying sources and decreasing liquid calories, avoiding temptations and identifying enticing environmental cues, and planning for success.  Additional resources provided today: NA  Recommended Physical Activity Goals  Diana Garrison has been advised to work up to 150 minutes of moderate intensity aerobic activity a week and strengthening exercises 2-3 times per week for cardiovascular health, weight loss maintenance and preservation of muscle mass.   She has agreed to Start aerobic activity with a goal of 150 minutes a week at moderate intensity.   Pharmacotherapy changes for the treatment of obesity:  continue Qsymia 7.5/46 mg qAM  ASSOCIATED CONDITIONS ADDRESSED TODAY  Polyphagia Assessment & Plan: Polyphasia has improved on Qsymia 7.5/46 mg once daily, eating on a schedule and increasing intake of lean protein and fiber with meals.  She is happy with her results thus far.  Her blood pressure and heart rate are within normal limits on Qsymia and she is not at risk for pregnancy.  Continue Qsymia 7.5/46 mg once daily. Recheck chemistry panel with labs in 2 months  Orders: -     Qsymia; Take 1 capsule by mouth every morning.  Dispense: 30 capsule; Refill: 0  Morbid obesity -     Qsymia; Take  1 capsule by mouth every morning.  Dispense: 30 capsule; Refill: 0  Vitamin D deficiency Assessment & Plan: Last vitamin D Lab Results  Component Value Date   VD25OH 20.6 (L) 04/26/2022   Taking ergocalciferol 50,000 IU once weekly.  Energy level is starting to improve.  Denies adverse side effects.  Refilled ergocalciferol 50,000 IU once weekly.  Recheck level in 2 months  Orders: -     Vitamin D (Ergocalciferol); Take 1  capsule (50,000 Units total) by mouth every 7 (seven) days.  Dispense: 5 capsule; Refill: 2  BMI 50.0-59.9, adult  Pre-diabetes Assessment & Plan: Lab Results  Component Value Date   HGBA1C 6.0 (H) 04/26/2022   History of metformin intolerance.  Patient is actively working on reducing her intake of added sugar and refined carbohydrates.  She has started reading labels for added sugar, limiting products with over 8 g of sugar per serving.  She has increased her walking time.  Plan to recheck fasting insulin and A1c in 2 months   OSA on CPAP Assessment & Plan: Working on compliance with wearing CPAP nightly, averaging 5 hours each day.  She has tried out various different masks but has some claustrophobia.  She has reduced her frequency of middle of the night awakenings to eat.  Plan: Continue actively working on CPAP usage at night and continue active plan for weight reduction.       She was informed of the importance of frequent follow up visits to maximize her success with intensive lifestyle modifications for her multiple health conditions.   ATTESTASTION STATEMENTS:  Reviewed by clinician on day of visit: allergies, medications, problem list, medical history, surgical history, family history, social history, and previous encounter notes pertinent to obesity diagnosis.   I have personally spent 30 minutes total time today in preparation, patient care, nutritional counseling and documentation for this visit, including the following: review of clinical lab tests; review of medical tests/procedures/services.      Glennis Brink, DO DABFM, DABOM [facility] [address] [place], [place] [address] [phone]

## 2022-06-27 NOTE — Assessment & Plan Note (Signed)
Working on compliance with wearing CPAP nightly, averaging 5 hours each day.  She has tried out various different masks but has some claustrophobia.  She has reduced her frequency of middle of the night awakenings to eat.  Plan: Continue actively working on CPAP usage at night and continue active plan for weight reduction.

## 2022-07-04 DIAGNOSIS — G4733 Obstructive sleep apnea (adult) (pediatric): Secondary | ICD-10-CM | POA: Diagnosis not present

## 2022-07-07 ENCOUNTER — Other Ambulatory Visit: Payer: Self-pay

## 2022-07-07 ENCOUNTER — Other Ambulatory Visit (HOSPITAL_COMMUNITY): Payer: Self-pay

## 2022-07-07 ENCOUNTER — Other Ambulatory Visit: Payer: Self-pay | Admitting: Family Medicine

## 2022-07-07 DIAGNOSIS — M25561 Pain in right knee: Secondary | ICD-10-CM

## 2022-07-07 MED ORDER — MELOXICAM 15 MG PO TABS
15.0000 mg | ORAL_TABLET | Freq: Every day | ORAL | 1 refills | Status: DC
  Filled 2022-07-07: qty 90, 90d supply, fill #0
  Filled 2022-07-10 – 2022-10-02 (×2): qty 90, 90d supply, fill #1

## 2022-07-08 DIAGNOSIS — G4733 Obstructive sleep apnea (adult) (pediatric): Secondary | ICD-10-CM | POA: Diagnosis not present

## 2022-07-10 ENCOUNTER — Other Ambulatory Visit (HOSPITAL_COMMUNITY): Payer: Self-pay

## 2022-07-11 ENCOUNTER — Other Ambulatory Visit: Payer: Self-pay

## 2022-07-11 ENCOUNTER — Other Ambulatory Visit (HOSPITAL_COMMUNITY): Payer: Self-pay

## 2022-07-11 MED ORDER — DICLOFENAC SODIUM 75 MG PO TBEC
75.0000 mg | DELAYED_RELEASE_TABLET | Freq: Two times a day (BID) | ORAL | 0 refills | Status: DC | PRN
  Filled 2022-07-11: qty 60, 30d supply, fill #0

## 2022-07-13 ENCOUNTER — Ambulatory Visit
Admission: RE | Admit: 2022-07-13 | Discharge: 2022-07-13 | Disposition: A | Discharge status: Home / Self Care | Payer: 59 | Source: Ambulatory Visit | Attending: Family Medicine | Admitting: Family Medicine

## 2022-07-13 DIAGNOSIS — Z1231 Encounter for screening mammogram for malignant neoplasm of breast: Secondary | ICD-10-CM | POA: Diagnosis not present

## 2022-07-25 ENCOUNTER — Ambulatory Visit (INDEPENDENT_AMBULATORY_CARE_PROVIDER_SITE_OTHER): Payer: 59 | Admitting: Family Medicine

## 2022-07-26 ENCOUNTER — Ambulatory Visit (INDEPENDENT_AMBULATORY_CARE_PROVIDER_SITE_OTHER): Payer: 59 | Admitting: Podiatry

## 2022-07-26 ENCOUNTER — Encounter: Payer: Self-pay | Admitting: Podiatry

## 2022-07-26 ENCOUNTER — Ambulatory Visit (INDEPENDENT_AMBULATORY_CARE_PROVIDER_SITE_OTHER): Payer: 59

## 2022-07-26 DIAGNOSIS — M79672 Pain in left foot: Secondary | ICD-10-CM

## 2022-07-26 DIAGNOSIS — M722 Plantar fascial fibromatosis: Secondary | ICD-10-CM

## 2022-07-26 MED ORDER — TRIAMCINOLONE ACETONIDE 10 MG/ML IJ SUSP
10.0000 mg | Freq: Once | INTRAMUSCULAR | Status: AC
Start: 2022-07-26 — End: 2022-07-26
  Administered 2022-07-26: 10 mg

## 2022-07-26 NOTE — Progress Notes (Signed)
Patient states she has had subjective:   Patient ID: Diana Garrison, female   DOB: 52 y.o.   MRN: [id number]   HPI 3 months of intense discomfort in the heel region left states it hurts with pressure first up in the morning and after.  Subsiding.  Patient smokes 1/4 pack of cigarettes per day and is obese complicating factor and is not active currently   Review of Systems  All other systems reviewed and are negative.       Objective:  Physical Exam Vitals and nursing note reviewed.  Constitutional:      Appearance: She is well-developed.  Pulmonary:     Effort: Pulmonary effort is normal.  Musculoskeletal:        General: Normal range of motion.  Skin:    General: Skin is warm.  Neurological:     Mental Status: She is alert.     Neurovascular status intact muscle strength found to be adequate range of motion adequate significant flatfoot deformity noted bilateral with exquisite discomfort noted at the insertion of the tendon into the calcaneus     Assessment:  Acute Planter fasciitis left     Plan:  H&P x-ray reviewed sterile prep injected the plantar fascia left 3 mg Kenalog 5 mg Xylocaine applied fascial brace to lift up the arch continue oral anti-inflammatories she was on and stretching exercises shoe gear modifications recommended  X-rays indicate spur formation no indication stress fracture arthritis

## 2022-07-26 NOTE — Patient Instructions (Signed)

## 2022-07-31 ENCOUNTER — Other Ambulatory Visit: Payer: Self-pay

## 2022-07-31 ENCOUNTER — Ambulatory Visit (INDEPENDENT_AMBULATORY_CARE_PROVIDER_SITE_OTHER): Payer: 59 | Admitting: Family Medicine

## 2022-07-31 ENCOUNTER — Encounter (INDEPENDENT_AMBULATORY_CARE_PROVIDER_SITE_OTHER): Payer: Self-pay | Admitting: Family Medicine

## 2022-07-31 VITALS — BP 129/85 | HR 91 | Temp 99.0°F | Ht 66.0 in | Wt 332.0 lb

## 2022-07-31 DIAGNOSIS — R632 Polyphagia: Secondary | ICD-10-CM

## 2022-07-31 DIAGNOSIS — Z6841 Body Mass Index (BMI) 40.0 and over, adult: Secondary | ICD-10-CM

## 2022-07-31 DIAGNOSIS — R7303 Prediabetes: Secondary | ICD-10-CM | POA: Diagnosis not present

## 2022-07-31 DIAGNOSIS — E559 Vitamin D deficiency, unspecified: Secondary | ICD-10-CM | POA: Diagnosis not present

## 2022-07-31 MED ORDER — VITAMIN D (ERGOCALCIFEROL) 1.25 MG (50000 UNIT) PO CAPS
50000.0000 [IU] | ORAL_CAPSULE | ORAL | 0 refills | Status: DC
Start: 2022-07-31 — End: 2022-09-04
  Filled 2022-07-31 – 2022-08-04 (×2): qty 5, 35d supply, fill #0

## 2022-07-31 MED ORDER — QSYMIA 11.25-69 MG PO CP24
1.0000 | ORAL_CAPSULE | Freq: Every morning | ORAL | 0 refills | Status: DC
Start: 2022-07-31 — End: 2022-09-04
  Filled 2022-07-31 – 2022-08-19 (×2): qty 30, 30d supply, fill #0

## 2022-07-31 NOTE — Assessment & Plan Note (Signed)
Reviewed bioimpedance results. Reviewed alternative food options, dietary tracking apps Additional options for breakfast given Aim for 110 to 120 g of dietary protein intake daily  Blood pressure and heart rate have tolerated Qsymia 7.5/46 mg well without adverse side effect Continue working on dietary logging with a target goal of 1600 cal/day 120 g protein daily

## 2022-07-31 NOTE — Assessment & Plan Note (Signed)
Lab Results  Component Value Date   HGBA1C 6.0 (H) 04/26/2022   She has a history of metformin intolerance.  She is actively working on reducing her body weight, reducing intake of added sugar and refined carbohydrates.  She has added more routine walking.  Will recheck fasting insulin, A1c, chemistry panel next visit

## 2022-07-31 NOTE — Progress Notes (Signed)
Office: [phone]  /  Fax: [phone]  WEIGHT SUMMARY AND BIOMETRICS  Starting Date: 10/18/21  Starting Weight: 335lb   Weight Lost Since Last Visit: 1lb   Vitals Temp: 99 F (37.2 C) BP: 129/85 Pulse Rate: 91 SpO2: 98 %   Body Composition  Body Fat %: 55.4 % Fat Mass (lbs): 184.2 lbs Muscle Mass (lbs): 141 lbs Total Body Water (lbs): 109 lbs Visceral Fat Rating : 22     HPI  Chief Complaint: OBESITY  Diana Garrison is here to discuss her progress with her obesity treatment plan. She is on the the Category 2 Plan and states she is following her eating plan approximately 90 % of the time. She states she is exercising 30 minutes 3-4 times per week.   Interval History:  Since last office visit she is down 1 lb This gives her a net weight loss of 3 pounds in the past 9 months of medically supervised weight management She is doing well on her meal plan but is getting tired of some of the foods She likes to walk outdoors but has been limited due to the rain She gets 15 min total on the days that she can walk She gets in a lot of walking at work - [employer] She is looking into water exercise for the summer She is taking Qsymia 7.5/46  mg daily and feels improvements in satiety.  She denies meal skipping   Pharmacotherapy: Qsymia 7.5/46 mg daily  PHYSICAL EXAM:  Blood pressure 129/85, pulse 91, temperature 99 F (37.2 C), height 5\' 6"  (1.676 m), weight (!) 332 lb (150.6 kg), SpO2 98 %. Body mass index is 53.59 kg/m.  General: She is overweight, cooperative, alert, well developed, and in no acute distress. PSYCH: Has normal mood, affect and thought process.   Lungs: Normal breathing effort, no conversational dyspnea.   ASSESSMENT AND PLAN  TREATMENT PLAN FOR OBESITY:  Recommended Dietary Goals  Jakyah is currently in the action stage of change. As such, her goal is to continue weight management plan. She has agreed to keeping a food  journal and adhering to recommended goals of 1600 calories and 120 g of protein.  Behavioral Intervention  We discussed the following Behavioral Modification Strategies today: increasing lean protein intake, decreasing simple carbohydrates , increasing vegetables, increasing lower glycemic fruits, increasing water intake, keeping healthy foods at home, avoiding temptations and identifying enticing environmental cues, continue to practice mindfulness when eating, and planning for success.  Additional resources provided today: NA  Recommended Physical Activity Goals  Myisha has been advised to work up to 150 minutes of moderate intensity aerobic activity a week and strengthening exercises 2-3 times per week for cardiovascular health, weight loss maintenance and preservation of muscle mass.   She has agreed to [employer] strengthening exercises with a goal of 2-3 sessions a week   Pharmacotherapy changes for the treatment of obesity: Increase Qsymia to 11.25/69 mg once daily with breakfast  ASSOCIATED CONDITIONS ADDRESSED TODAY  Vitamin D deficiency Assessment & Plan: Last vitamin D Lab Results  Component Value Date   VD25OH 20.6 (L) 04/26/2022   She is doing well on prescription vitamin D 50,000 IU once weekly.  Her energy level is starting to improve.  She denies adverse side effects.  Target vitamin D level is 50-70  Continue vitamin D 50,000 IU once weekly.  Recheck level next visit  Orders: -     Vitamin D (Ergocalciferol); Take 1 capsule (50,000 Units total)  by mouth every 7 (seven) days.  Dispense: 5 capsule; Refill: 0  Polyphagia Assessment & Plan: Polyphasia has improved on Qsymia without adverse side effects.  Blood pressure and heart rate are well-controlled and she is not at risk for pregnancy.  Due to her plateau in weight loss, will increase Qsymia to 11.25/69 mg once daily with breakfast.  Continue working on dietary logging compliance, tracking daily steps and adding in  more weight training.  We have discussed options for gym workouts versus home exercise.    Orders: -     Qsymia; 1 capsule po qAM  Dispense: 30 capsule; Refill: 0  Morbid obesity (HCC) with starting BMI 54 Assessment & Plan: Reviewed bioimpedance results. Reviewed alternative food options, dietary tracking apps Additional options for breakfast given Aim for 110 to 120 g of dietary protein intake daily  Blood pressure and heart rate have tolerated Qsymia 7.5/46 mg well without adverse side effect Continue working on dietary logging with a target goal of 1600 cal/day 120 g protein daily   BMI 50.0-59.9, adult (HCC)  Pre-diabetes Assessment & Plan: Lab Results  Component Value Date   HGBA1C 6.0 (H) 04/26/2022   She has a history of metformin intolerance.  She is actively working on reducing her body weight, reducing intake of added sugar and refined carbohydrates.  She has added more routine walking.  Will recheck fasting insulin, A1c, chemistry panel next visit       She was informed of the importance of frequent follow up visits to maximize her success with intensive lifestyle modifications for her multiple health conditions.   ATTESTASTION STATEMENTS:  Reviewed by clinician on day of visit: allergies, medications, problem list, medical history, surgical history, family history, social history, and previous encounter notes pertinent to obesity diagnosis.   I have personally spent 30 minutes total time today in preparation, patient care, nutritional counseling and documentation for this visit, including the following: review of clinical lab tests; review of medical tests/procedures/services.      Glennis Brink, DO DABFM, DABOM [facility] [address] [place], [place] [address] [phone]

## 2022-07-31 NOTE — Assessment & Plan Note (Signed)
Last vitamin D Lab Results  Component Value Date   VD25OH 20.6 (L) 04/26/2022   She is doing well on prescription vitamin D 50,000 IU once weekly.  Her energy level is starting to improve.  She denies adverse side effects.  Target vitamin D level is 50-70  Continue vitamin D 50,000 IU once weekly.  Recheck level next visit

## 2022-07-31 NOTE — Assessment & Plan Note (Signed)
Polyphasia has improved on Qsymia without adverse side effects.  Blood pressure and heart rate are well-controlled and she is not at risk for pregnancy.  Due to her plateau in weight loss, will increase Qsymia to 11.25/69 mg once daily with breakfast.  Continue working on dietary logging compliance, tracking daily steps and adding in more weight training.  We have discussed options for gym workouts versus home exercise.

## 2022-08-01 ENCOUNTER — Other Ambulatory Visit (HOSPITAL_COMMUNITY): Payer: Self-pay

## 2022-08-03 ENCOUNTER — Other Ambulatory Visit (HOSPITAL_COMMUNITY): Payer: Self-pay

## 2022-08-04 ENCOUNTER — Other Ambulatory Visit (HOSPITAL_COMMUNITY): Payer: Self-pay

## 2022-08-04 ENCOUNTER — Other Ambulatory Visit: Payer: Self-pay

## 2022-08-07 DIAGNOSIS — G4733 Obstructive sleep apnea (adult) (pediatric): Secondary | ICD-10-CM | POA: Diagnosis not present

## 2022-08-08 ENCOUNTER — Telehealth (INDEPENDENT_AMBULATORY_CARE_PROVIDER_SITE_OTHER): Payer: Self-pay | Admitting: Family Medicine

## 2022-08-08 NOTE — Telephone Encounter (Signed)
Prior authorization done via cover my meds for patients Qsymia. Waiting on determination.  

## 2022-08-09 ENCOUNTER — Other Ambulatory Visit (HOSPITAL_COMMUNITY): Payer: Self-pay

## 2022-08-14 ENCOUNTER — Ambulatory Visit (INDEPENDENT_AMBULATORY_CARE_PROVIDER_SITE_OTHER): Payer: 59 | Admitting: Podiatry

## 2022-08-14 ENCOUNTER — Encounter: Payer: Self-pay | Admitting: Podiatry

## 2022-08-14 DIAGNOSIS — M722 Plantar fascial fibromatosis: Secondary | ICD-10-CM | POA: Diagnosis not present

## 2022-08-15 NOTE — Telephone Encounter (Signed)
Received through Cover My Meds: This request has not been approved. Based on the information sent for review, the requested drug did not meet our guideline rules. To get the request approved, your doctor needs to show that you have met the guideline rules below. If you have questions, please call your doctor. In some cases, the requested drug or alternatives offered may have other guideline rules that need to be met. Your provider requested Qsymia 11.25mg -69mg  capsules for weight loss and weight management. For the treatment of weight loss, our guideline named ANTI-OBESITY AGENTS (reviewed for Qsymia) requires that Qsymia 11.25mg -69mg  capsules is being used as a loading does for 2 weeks and followed by Qsymia 15/92mg  capsules for maintenance if you have not achieved 3% weight loss from baseline after 12 weeks on Qsymia 7.5-46mg  capsules.This request was denied because we did not receive information that you meet the requirement listed above. Please work with your doctor to use a different medication or get [place] more information if it will allow [place] to approve this request. A written notification letter will follow with additional details.

## 2022-08-16 NOTE — Progress Notes (Signed)
Subjective:   Patient ID: Diana Garrison, female   DOB: 52 y.o.   MRN: [id number]   HPI Patient states she is doing much better with reduced pain bilateral and states that she would like to be more active neurovascular   ROS      Objective:  Physical Exam  That is intact significant diminishment of discomfort in the plantar fascia bilateral at the insertional point tendon calcaneus     Assessment:  Acute Planter fasciitis bilateral with flatfoot structure that is doing much better     Plan:  H&P discussed continued shoe gear modifications good shoes to wear support therapy and stretching.  Reappoint as symptoms indicate may require further treatment may require orthotics

## 2022-08-19 ENCOUNTER — Other Ambulatory Visit (HOSPITAL_COMMUNITY): Payer: Self-pay

## 2022-08-22 ENCOUNTER — Telehealth (INDEPENDENT_AMBULATORY_CARE_PROVIDER_SITE_OTHER): Payer: Self-pay | Admitting: Family Medicine

## 2022-08-22 ENCOUNTER — Other Ambulatory Visit (HOSPITAL_COMMUNITY): Payer: Self-pay

## 2022-08-22 NOTE — Telephone Encounter (Signed)
6.11 Patient stated  prescription was wrote for qsymia incorrectly  and insurance will not approve.JE

## 2022-08-23 ENCOUNTER — Encounter (INDEPENDENT_AMBULATORY_CARE_PROVIDER_SITE_OTHER): Payer: Self-pay | Admitting: Family Medicine

## 2022-08-23 ENCOUNTER — Other Ambulatory Visit (HOSPITAL_COMMUNITY): Payer: Self-pay

## 2022-08-23 NOTE — Telephone Encounter (Signed)
Phone call to pharmacy to check on patients prescriptions. Per the pharmacy medication Qsymia was written correctly and medication was denied. Phone call to patient to let her know medication was denied and she can pay cash for 144$ patient is ok with that and will pick up medication.

## 2022-08-28 ENCOUNTER — Ambulatory Visit (INDEPENDENT_AMBULATORY_CARE_PROVIDER_SITE_OTHER): Payer: 59 | Admitting: Family Medicine

## 2022-09-04 ENCOUNTER — Encounter (INDEPENDENT_AMBULATORY_CARE_PROVIDER_SITE_OTHER): Payer: Self-pay | Admitting: Family Medicine

## 2022-09-04 ENCOUNTER — Other Ambulatory Visit (HOSPITAL_COMMUNITY): Payer: Self-pay

## 2022-09-04 ENCOUNTER — Ambulatory Visit (INDEPENDENT_AMBULATORY_CARE_PROVIDER_SITE_OTHER): Payer: 59 | Admitting: Family Medicine

## 2022-09-04 VITALS — BP 124/84 | HR 82 | Temp 98.3°F | Ht 66.0 in | Wt 335.0 lb

## 2022-09-04 DIAGNOSIS — Z6841 Body Mass Index (BMI) 40.0 and over, adult: Secondary | ICD-10-CM | POA: Diagnosis not present

## 2022-09-04 DIAGNOSIS — E559 Vitamin D deficiency, unspecified: Secondary | ICD-10-CM | POA: Diagnosis not present

## 2022-09-04 DIAGNOSIS — R7303 Prediabetes: Secondary | ICD-10-CM | POA: Diagnosis not present

## 2022-09-04 DIAGNOSIS — Z91199 Patient's noncompliance with other medical treatment and regimen due to unspecified reason: Secondary | ICD-10-CM | POA: Insufficient documentation

## 2022-09-04 MED ORDER — VITAMIN D (ERGOCALCIFEROL) 1.25 MG (50000 UNIT) PO CAPS
50000.0000 [IU] | ORAL_CAPSULE | ORAL | 0 refills | Status: DC
Start: 2022-09-04 — End: 2022-12-20
  Filled 2022-09-04: qty 5, 35d supply, fill #0

## 2022-09-04 NOTE — Assessment & Plan Note (Signed)
Lab Results  Component Value Date   HGBA1C 6.0 (H) 04/26/2022   Hx of metformin intolerance Has actively been working on reducing intake of starches and sweets Has lost 0% TBW loss in 10 mos of medically supervised weight management Fairly active at work but no formal exercise has been added Has declined weight loss surgery Insurance does not cover GLP-1 agonist therapy  Continue to work on dietary changes Set up RD visit Track daily steps with a goal of 10,000 daily

## 2022-09-04 NOTE — Progress Notes (Signed)
Office: [phone]  /  Fax: [phone]  WEIGHT SUMMARY AND BIOMETRICS  Starting Date: 10/18/21  Starting Weight: 335lb   Weight Lost Since Last Visit: 0lb   Vitals Temp: 98.3 F (36.8 C) BP: 124/84 Pulse Rate: 82 SpO2: 97 %   Body Composition  Body Fat %: 54.9 % Fat Mass (lbs): 184 lbs Muscle Mass (lbs): 143.6 lbs Visceral Fat Rating : 22     HPI  Chief Complaint: OBESITY  Diana Garrison is here to discuss her progress with her obesity treatment plan. She is on the the Category 2 Plan and states she is following her eating plan approximately 95 % of the time. She states she is doing chair exercises a couple days a week.    Interval History:  Since last office visit she is up 3 lb She has been limited with walking due to acute on chronic R knee pain She did go up on Qsymia to 11.25/69 mg qAM She was supposed to be logging dietary intake with a goal of 1600 cal/ day and 120 g of protein daily as reviewed at last visit but she reports no days of logging Denies adverse SE but isn't feeling any changes in appetite She has a net weight loss of 0 lb in the past 10 mos of medically supervised weight management She has maintained her body weight for years She is getting ~7,000 steps on work days She is opposed to bariatric surgery after her sister died from complications post bariatric surgery   Pharmacotherapy: Qsymia 11.25/69 mg daily  PHYSICAL EXAM:  Blood pressure 124/84, pulse 82, temperature 98.3 F (36.8 C), height 5\' 6"  (1.676 m), weight (!) 335 lb (152 kg), SpO2 97 %. Body mass index is 54.07 kg/m.  General: She is overweight, cooperative, alert, well developed, and in no acute distress. PSYCH: Has normal mood, affect and thought process.   Lungs: Normal breathing effort, no conversational dyspnea.   ASSESSMENT AND PLAN  TREATMENT PLAN FOR OBESITY:  Recommended Dietary Goals  Diana Garrison is currently in the action stage of change. As such, her goal  is to continue weight management plan. She has agreed to keeping a food journal and adhering to recommended goals of 1600 calories and 120 g of protein.  Behavioral Intervention  We discussed the following Behavioral Modification Strategies today: increasing lean protein intake, decreasing simple carbohydrates , increasing vegetables, increasing lower glycemic fruits, increasing water intake, work on tracking and journaling calories using tracking application, keeping healthy foods at home, work on managing stress, creating time for self-care and relaxation measures, continue to work on implementation of reduced calorie nutritional plan, continue to practice mindfulness when eating, and planning for success.  Additional resources provided today: NA  Recommended Physical Activity Goals  Diana Garrison has been advised to work up to 150 minutes of moderate intensity aerobic activity a week and strengthening exercises 2-3 times per week for cardiovascular health, weight loss maintenance and preservation of muscle mass.   She has agreed to Think about ways to increase daily physical activity and overcoming barriers to exercise  Pharmacotherapy changes for the treatment of obesity: d/c Qsymia  ASSOCIATED CONDITIONS ADDRESSED TODAY  Pre-diabetes Assessment & Plan: Lab Results  Component Value Date   HGBA1C 6.0 (H) 04/26/2022   Hx of metformin intolerance Has actively been working on reducing intake of starches and sweets Has lost 0% TBW loss in 10 mos of medically supervised weight management Fairly active at work but no formal exercise has been added Has  declined weight loss surgery Insurance does not cover GLP-1 agonist therapy  Continue to work on dietary changes Set up RD visit Track daily steps with a goal of 10,000 daily  Orders: -     Comprehensive metabolic panel -     Hemoglobin A1c  Vitamin D deficiency Assessment & Plan: Last vitamin D Lab Results  Component Value Date    VD25OH 20.6 (L) 04/26/2022   On RX Vitamin D weekly.  Energy level has improved.  Denies adverse SE's.  Recheck lab today  Orders: -     Vitamin D (Ergocalciferol); Take 1 capsule (50,000 Units total) by mouth every 7 (seven) days.  Dispense: 5 capsule; Refill: 0 -     VITAMIN D 25 Hydroxy (Vit-D Deficiency, Fractures)  Morbid obesity (HCC) -     Amb ref to Medical Nutrition Therapy-MNT  BMI 50.0-59.9, adult (HCC)  Non-compliance with treatment Assessment & Plan: Pt has struggled to log daily caloric intake or add in formal exercise over the past 10 mos leaving 0 lb of weight loss even with the addition of Qsymia, ramping to dose 3.     Referral to RD made Encouraged logging calories and steps for better result adherence        She was informed of the importance of frequent follow up visits to maximize her success with intensive lifestyle modifications for her multiple health conditions.   ATTESTASTION STATEMENTS:  Reviewed by clinician on day of visit: allergies, medications, problem list, medical history, surgical history, family history, social history, and previous encounter notes pertinent to obesity diagnosis.   I have personally spent 30 minutes total time today in preparation, patient care, nutritional counseling and documentation for this visit, including the following: review of clinical lab tests; review of medical tests/procedures/services.      Diana Brink, DO DABFM, DABOM [facility] [address] [place], [place] [address] [phone]

## 2022-09-04 NOTE — Assessment & Plan Note (Signed)
Last vitamin D Lab Results  Component Value Date   VD25OH 20.6 (L) 04/26/2022   On RX Vitamin D weekly.  Energy level has improved.  Denies adverse SE's.  Recheck lab today

## 2022-09-04 NOTE — Assessment & Plan Note (Signed)
Pt has struggled to log daily caloric intake or add in formal exercise over the past 10 mos leaving 0 lb of weight loss even with the addition of Qsymia, ramping to dose 3.     Referral to RD made Encouraged logging calories and steps for better result adherence

## 2022-09-05 ENCOUNTER — Other Ambulatory Visit: Payer: Self-pay

## 2022-09-05 LAB — COMPREHENSIVE METABOLIC PANEL
ALT: 31 IU/L (ref 0–32)
AST: 25 IU/L (ref 0–40)
Albumin: 4.1 g/dL (ref 3.8–4.9)
Alkaline Phosphatase: 109 IU/L (ref 44–121)
BUN/Creatinine Ratio: 18 (ref 9–23)
BUN: 12 mg/dL (ref 6–24)
Bilirubin Total: 0.4 mg/dL (ref 0.0–1.2)
CO2: 19 mmol/L — ABNORMAL LOW (ref 20–29)
Calcium: 9.1 mg/dL (ref 8.7–10.2)
Chloride: 109 mmol/L — ABNORMAL HIGH (ref 96–106)
Creatinine, Ser: 0.66 mg/dL (ref 0.57–1.00)
Globulin, Total: 2.4 g/dL (ref 1.5–4.5)
Glucose: 91 mg/dL (ref 70–99)
Potassium: 4.4 mmol/L (ref 3.5–5.2)
Sodium: 140 mmol/L (ref 134–144)
Total Protein: 6.5 g/dL (ref 6.0–8.5)
eGFR: 106 mL/min/{1.73_m2} (ref >59)

## 2022-09-05 LAB — VITAMIN D 25 HYDROXY (VIT D DEFICIENCY, FRACTURES): Vit D, 25-Hydroxy: 27.7 ng/mL — ABNORMAL LOW (ref 30.0–100.0)

## 2022-09-05 LAB — HEMOGLOBIN A1C
Est. average glucose Bld gHb Est-mCnc: 128 mg/dL
Hgb A1c MFr Bld: 6.1 % — ABNORMAL HIGH (ref 4.8–5.6)

## 2022-09-07 DIAGNOSIS — G4733 Obstructive sleep apnea (adult) (pediatric): Secondary | ICD-10-CM | POA: Diagnosis not present

## 2022-09-20 ENCOUNTER — Ambulatory Visit (INDEPENDENT_AMBULATORY_CARE_PROVIDER_SITE_OTHER): Payer: 59 | Admitting: Family Medicine

## 2022-09-20 ENCOUNTER — Other Ambulatory Visit (HOSPITAL_COMMUNITY)
Admission: RE | Admit: 2022-09-20 | Discharge: 2022-09-20 | Disposition: A | Discharge status: Home / Self Care | Payer: 59 | Source: Ambulatory Visit | Attending: Family Medicine | Admitting: Family Medicine

## 2022-09-20 ENCOUNTER — Encounter: Payer: Self-pay | Admitting: Family Medicine

## 2022-09-20 VITALS — BP 122/76 | HR 84 | Temp 97.6°F | Ht 66.0 in | Wt 335.4 lb

## 2022-09-20 DIAGNOSIS — E7849 Other hyperlipidemia: Secondary | ICD-10-CM | POA: Diagnosis not present

## 2022-09-20 DIAGNOSIS — Z124 Encounter for screening for malignant neoplasm of cervix: Secondary | ICD-10-CM | POA: Insufficient documentation

## 2022-09-20 DIAGNOSIS — Z0000 Encounter for general adult medical examination without abnormal findings: Secondary | ICD-10-CM

## 2022-09-20 DIAGNOSIS — R6882 Decreased libido: Secondary | ICD-10-CM | POA: Diagnosis not present

## 2022-09-20 NOTE — Assessment & Plan Note (Signed)
I will check lipids today. Continue rosuvastatin 20 mg daily. 

## 2022-09-20 NOTE — Assessment & Plan Note (Signed)
Has noted decreased sex drive since menopause. has some vaginal dryness as well. We discussed use of vaginal lubricants. I will refer her to GYN to discuss potential testosterone therapy for her libido.

## 2022-09-20 NOTE — Assessment & Plan Note (Signed)
I support her plans to focus for now on diet and exercise. She could be a candidate for use of GLP-1 agent, esp. in light of some of her metabolic issues. We will readdress this at her next visit.

## 2022-09-20 NOTE — Progress Notes (Signed)
[facility] [address] [place] [address] Dept: [phone] Dept Fax: [phone]  Annual Physical Visit  Subjective:    Patient ID: Diana Garrison, female    DOB: 05/27/1970, 52 y.o..   MRN: [id number]  Chief Complaint  Patient presents with   Annual Exam    CPE/labs.  No concerns. Not fasting today.    History of Present Illness:  Patient is in today for an annual physical/preventative visit.  Diana Garrison has a history of obesity. She [facility]. She was managed initially with Qsymia. She was tried on metformin, but had significant GI effects. She has had no weight loss so far. The clinic talked ot her about an injection (GLP1-RA) or surgery. She does not want to consider metabolic surgery options, as her sister had significant complications from this. Diana Garrison has a pending appointment with a nutritionist and has ordered a treadmill to use at home.  Diana Garrison has a history of hyperlipidemia. She is managed on rosuvastatin 20 mg daily.  Review of Systems  Constitutional:  Negative for chills, diaphoresis, fever, malaise/fatigue and weight loss.  HENT:  Negative for congestion, ear pain, hearing loss, sinus pain, sore throat and tinnitus.   Eyes:  Negative for blurred vision, pain, discharge and redness.  Respiratory:  Negative for cough, shortness of breath and wheezing.   Cardiovascular:  Negative for chest pain and palpitations.  Gastrointestinal:  Negative for abdominal pain, constipation, diarrhea, heartburn, nausea and vomiting.  Musculoskeletal:  Positive for joint pain. Negative for back pain and myalgias.       History of right knee meniscal tear. She feels motivated to lose weight to help reduce her right knee pain.  Skin:  Negative for itching and rash.  Psychiatric/Behavioral:  Negative for depression. The patient is not nervous/anxious.    Past Medical  History: Patient Active Problem List   Diagnosis Date Noted   Decreased libido 09/20/2022   OSA on CPAP 06/27/2022   Polyphagia 05/16/2022   Morbid obesity (HCC) with starting BMI 54 01/03/2022   Pre-diabetes 12/05/2021   Other hyperlipidemia 11/01/2021   Depression 11/01/2021   Seasonal allergic rhinitis 10/26/2021   Hyperlipidemia 10/26/2021   Vitamin D insufficiency 10/20/2021   Moderate episode of recurrent major depressive disorder (HCC) 08/16/2021   Right knee meniscal tear 08/16/2021   Spider veins of both lower extremities 10/17/2016   Tobacco use disorder 06/06/2013   Past Surgical History:  Procedure Laterality Date   CESAREAN SECTION N/A    [employer] 11/12/2019   GANGLION CYST EXCISION Bilateral    x2   Family History  Problem Relation Age of Onset   Obesity Father    Cirrhosis Father    Pulmonary embolism Sister    Lupus Brother    Breast cancer Neg Hx    Outpatient Medications Prior to Visit  Medication Sig Dispense Refill   cyclobenzaprine (FLEXERIL) 10 MG tablet Take 0.5-1 tablets (5-10 mg total) by mouth 3 (three) times daily as needed for muscle spasms. 60 tablet 1   diclofenac (VOLTAREN) 75 MG EC tablet Take 1 tablet (75 mg total) by mouth 2 (two) times daily as needed for pain. 60 tablet 0   magnesium 30 MG tablet Take 1 tablet (30 mg total) by mouth 2 (two) times daily. 60 tablet 0   meloxicam (MOBIC) 15 MG tablet Take 1 tablet (15 mg total) by mouth daily. 90 tablet 1  Multiple Vitamin (MULTIVITAMIN) tablet Take 1 tablet by mouth daily.     rosuvastatin (CRESTOR) 20 MG tablet Take 1 tablet by mouth daily. 90 tablet 3   Vitamin D, Ergocalciferol, (DRISDOL) 1.25 MG (50000 UNIT) CAPS capsule Take 1 capsule (50,000 Units total) by mouth every 7 (seven) days. 5 capsule 0   zinc gluconate 50 MG tablet Take 1 tablet (50 mg total) by mouth daily. 30 tablet 0   B Complex Vitamins (VITAMIN B COMPLEX PO) Take by mouth.     No facility-administered medications  prior to visit.   Allergies  Allergen Reactions   Percocet [Oxycodone-Acetaminophen] Other (See Comments)    Hallucinations, paranoia   Objective:   Today's Vitals   09/20/22 1037  BP: 122/76  Pulse: 84  Temp: 97.6 F (36.4 C)  TempSrc: Temporal  SpO2: 98%  Weight: (!) 335 lb 6.4 oz (152.1 kg)  Height: 5\' 6"  (1.676 m)   Body mass index is 54.13 kg/m.   General: Well developed, well nourished. No acute distress. HEENT: Normocephalic, non-traumatic. PERRL, EOMI. Conjunctiva clear. External ears normal. EAC and TMs normal   bilaterally. Nose clear without congestion or rhinorrhea. Mucous membranes moist. Oropharynx clear. Fair dentition. Neck: Supple. No lymphadenopathy. No thyromegaly. Lungs: Clear to auscultation bilaterally. No wheezing, rales or rhonchi. CV: RRR without murmurs or rubs. Pulses 2+ bilaterally. Abdomen: Soft, non-tender. Bowel sounds positive, normal pitch and frequency. No hepatosplenomegaly. No rebound or   guarding. GU: Normal external genitalia. Vaginal vault is pink without discharge. Cervix is pink and appears normal. No CMT. Uterus   normal size. No adnexal masses.  Extremities: Full ROM. No joint swelling or tenderness. No edema noted. Skin: Warm and dry. No rashes. Psych: Alert and oriented. Normal mood and affect.  Health Maintenance Due  Topic Date Due   Zoster Vaccines- Shingrix (1 of 2) Never done   PAP SMEAR-Modifier  04/18/2022   Lab Results    Latest Ref Rng & Units 09/04/2022    4:38 PM 04/26/2022    2:41 PM 05/11/2021    8:49 AM  CMP  Glucose 70 - 99 mg/dL 91  324  401   BUN 6 - 24 mg/dL 12  14  9    Creatinine 0.57 - 1.00 mg/dL 0.27  2.53  6.64   Sodium 134 - 144 mmol/L 140  142  137   Potassium 3.5 - 5.2 mmol/L 4.4  4.3  4.3   Chloride 96 - 106 mmol/L 109  104  104   CO2 20 - 29 mmol/L 19  24  26    Calcium 8.7 - 10.2 mg/dL 9.1  9.6  9.3   Total Protein 6.0 - 8.5 g/dL 6.5  6.6  6.6   Total Bilirubin 0.0 - 1.2 mg/dL 0.4  0.3  0.4    Alkaline Phos 44 - 121 IU/L 109  106  95   AST 0 - 40 IU/L 25  16  17    ALT 0 - 32 IU/L 31  26  21     Last hemoglobin A1c Lab Results  Component Value Date   HGBA1C 6.1 (H) 09/04/2022   Last vitamin D Lab Results  Component Value Date   VD25OH 27.7 (L) 09/04/2022      Assessment & Plan:   Problem List Items Addressed This Visit       Other   Morbid obesity (HCC) with starting BMI 54 (Chronic)    I support her plans to focus for now on diet and exercise.  She could be a candidate for use of GLP-1 agent, esp. in light of some of her metabolic issues. We will readdress this at her next visit.      Other hyperlipidemia    I will check lipids today. Continue rosuvastatin 20 mg daily.      Relevant Orders   Lipid panel   Decreased libido    Has noted decreased sex drive since menopause. has some vaginal dryness as well. We discussed use of vaginal lubricants. I will refer her to GYN to discuss potential testosterone therapy for her libido.      Relevant Orders   Ambulatory referral to Obstetrics / Gynecology   Other Visit Diagnoses     Annual physical exam    -  Primary   Screening for cervical cancer       Relevant Orders   Cytology - PAP       Return in about 6 months (around 03/23/2023) for Reassessment.   Loyola Mast, MD

## 2022-09-22 LAB — CYTOLOGY - PAP
Adequacy: ABSENT
Comment: NEGATIVE
Diagnosis: NEGATIVE
High risk HPV: NEGATIVE

## 2022-09-27 ENCOUNTER — Other Ambulatory Visit: Payer: 59

## 2022-10-02 ENCOUNTER — Other Ambulatory Visit (HOSPITAL_COMMUNITY): Payer: Self-pay

## 2022-10-02 ENCOUNTER — Other Ambulatory Visit: Payer: Self-pay | Admitting: Registered Nurse

## 2022-10-02 ENCOUNTER — Other Ambulatory Visit: Payer: Self-pay | Admitting: Family Medicine

## 2022-10-02 ENCOUNTER — Other Ambulatory Visit: Payer: Self-pay

## 2022-10-02 DIAGNOSIS — T07XXXA Unspecified multiple injuries, initial encounter: Secondary | ICD-10-CM

## 2022-10-02 DIAGNOSIS — E7800 Pure hypercholesterolemia, unspecified: Secondary | ICD-10-CM

## 2022-10-02 MED ORDER — CYCLOBENZAPRINE HCL 10 MG PO TABS
5.0000 mg | ORAL_TABLET | Freq: Three times a day (TID) | ORAL | 1 refills | Status: DC | PRN
Start: 2022-10-02 — End: 2022-11-03
  Filled 2022-10-02: qty 60, 20d supply, fill #0

## 2022-10-03 ENCOUNTER — Other Ambulatory Visit: Payer: Self-pay | Admitting: Registered Nurse

## 2022-10-03 ENCOUNTER — Other Ambulatory Visit (HOSPITAL_COMMUNITY): Payer: Self-pay

## 2022-10-03 DIAGNOSIS — E7800 Pure hypercholesterolemia, unspecified: Secondary | ICD-10-CM

## 2022-10-06 ENCOUNTER — Other Ambulatory Visit (INDEPENDENT_AMBULATORY_CARE_PROVIDER_SITE_OTHER): Payer: 59

## 2022-10-06 DIAGNOSIS — E7849 Other hyperlipidemia: Secondary | ICD-10-CM

## 2022-10-06 LAB — LIPID PANEL
Cholesterol: 174 mg/dL (ref 0–200)
HDL: 40 mg/dL (ref >39.00)
LDL Cholesterol: 100 mg/dL — ABNORMAL HIGH (ref 0–99)
NonHDL: 134.12
Total CHOL/HDL Ratio: 4
Triglycerides: 170 mg/dL — ABNORMAL HIGH (ref 0.0–149.0)
VLDL: 34 mg/dL (ref 0.0–40.0)

## 2022-10-07 DIAGNOSIS — G4733 Obstructive sleep apnea (adult) (pediatric): Secondary | ICD-10-CM | POA: Diagnosis not present

## 2022-10-12 DIAGNOSIS — M1711 Unilateral primary osteoarthritis, right knee: Secondary | ICD-10-CM | POA: Diagnosis not present

## 2022-10-18 ENCOUNTER — Telehealth (INDEPENDENT_AMBULATORY_CARE_PROVIDER_SITE_OTHER): Payer: Self-pay | Admitting: Family Medicine

## 2022-10-18 NOTE — Telephone Encounter (Signed)
On 8/7, the patient indicated that she wants to cancel her appointment because she would like to consult with a nutritionist first.

## 2022-11-02 ENCOUNTER — Encounter: Payer: Self-pay | Admitting: Family Medicine

## 2022-11-03 ENCOUNTER — Ambulatory Visit: Payer: 59 | Admitting: Family Medicine

## 2022-11-03 ENCOUNTER — Other Ambulatory Visit: Payer: Self-pay

## 2022-11-03 ENCOUNTER — Encounter: Payer: Self-pay | Admitting: Family Medicine

## 2022-11-03 ENCOUNTER — Other Ambulatory Visit (HOSPITAL_COMMUNITY): Payer: Self-pay

## 2022-11-03 VITALS — BP 118/72 | HR 91 | Temp 98.7°F | Wt 339.4 lb

## 2022-11-03 DIAGNOSIS — R51 Headache with orthostatic component, not elsewhere classified: Secondary | ICD-10-CM | POA: Insufficient documentation

## 2022-11-03 DIAGNOSIS — E559 Vitamin D deficiency, unspecified: Secondary | ICD-10-CM

## 2022-11-03 DIAGNOSIS — M791 Myalgia, unspecified site: Secondary | ICD-10-CM

## 2022-11-03 MED ORDER — METHOCARBAMOL 500 MG PO TABS
500.0000 mg | ORAL_TABLET | Freq: Four times a day (QID) | ORAL | 1 refills | Status: DC
Start: 2022-11-03 — End: 2023-01-24
  Filled 2022-11-03 (×2): qty 60, 15d supply, fill #0
  Filled 2023-01-03: qty 60, 15d supply, fill #1

## 2022-11-03 NOTE — Assessment & Plan Note (Addendum)
I will recheck her Vitamin D level to see if this has now corrected.

## 2022-11-03 NOTE — Assessment & Plan Note (Signed)
By history, it sounds like Diana Garrison has had previous issues with idiopathic intercranial hypertension (pseudotumor cerebri). She is not having visual symptoms currently, but is having positional headaches. Her obesity likely contributes to this. I will refer her to neurology to consider if she needs an LP. I recommend she see her eye doctor for a dilated eye exam. She can continue Tylenol and ibuprofen for now for her headaches.

## 2022-11-03 NOTE — Progress Notes (Signed)
[facility] [address] [place] [address] Dept: [phone] Dept Fax: [phone]  Office Visit  Subjective:    Patient ID: Diana Garrison, female    DOB: March 21, 1970, 52 y.o..   MRN: [id number]  Chief Complaint  Patient presents with   Headache    Throbbing frontal headache x2-3 weeks. PT states this happens at night while they are sleeping and are woken up by the pain. Pain lasts about 20 minutes after waking up. Taking 2-3 tylenol as well as an ibuprofen for pain. Discuss vitamin D medication   History of Present Illness:  Patient is in today for complaining of a three week history of headache. She notes that these area occurring only at nighttime, coming on her sleep. The headache can be quite intense. She has taken Tylenol and then followed up with ibuprofen in 45 min, if the headache persists. She denies any visual changes, N/V, hearing changes, or difficulty walking.  Ms. Morgans notes about 20 years ago, she had developed an issue with headaches and double vision. She saw a neurologist who performed a lumbar puncture and drained a large volume of CSF off, which rapidly corrected her visual issue.  Past Medical History: Patient Active Problem List   Diagnosis Date Noted   Decreased libido 09/20/2022   OSA on CPAP 06/27/2022   Polyphagia 05/16/2022   Morbid obesity (HCC) with starting BMI 54 01/03/2022   Pre-diabetes 12/05/2021   Other hyperlipidemia 11/01/2021   Depression 11/01/2021   Seasonal allergic rhinitis 10/26/2021   Hyperlipidemia 10/26/2021   Vitamin D insufficiency 10/20/2021   Moderate episode of recurrent major depressive disorder (HCC) 08/16/2021   Right knee meniscal tear 08/16/2021   Spider veins of both lower extremities 10/17/2016   Tobacco use disorder 06/06/2013   Past Surgical History:  Procedure Laterality Date   CESAREAN SECTION N/A    [employer]reesia 11/12/2019   GANGLION CYST EXCISION  Bilateral    x2   Family History  Problem Relation Age of Onset   Obesity Father    Cirrhosis Father    Pulmonary embolism Sister    Lupus Brother    Breast cancer Neg Hx    Outpatient Medications Prior to Visit  Medication Sig Dispense Refill   magnesium 30 MG tablet Take 1 tablet (30 mg total) by mouth 2 (two) times daily. 60 tablet 0   meloxicam (MOBIC) 15 MG tablet Take 1 tablet (15 mg total) by mouth daily. 90 tablet 1   Multiple Vitamin (MULTIVITAMIN) tablet Take 1 tablet by mouth daily.     rosuvastatin (CRESTOR) 20 MG tablet Take 1 tablet by mouth daily. 90 tablet 3   Vitamin D, Ergocalciferol, (DRISDOL) 1.25 MG (50000 UNIT) CAPS capsule Take 1 capsule (50,000 Units total) by mouth every 7 (seven) days. 5 capsule 0   zinc gluconate 50 MG tablet Take 1 tablet (50 mg total) by mouth daily. 30 tablet 0   cyclobenzaprine (FLEXERIL) 10 MG tablet Take 0.5-1 tablets (5-10 mg total) by mouth 3 (three) times daily as needed for muscle spasms. 60 tablet 1   diclofenac (VOLTAREN) 75 MG EC tablet Take 1 tablet (75 mg total) by mouth 2 (two) times daily as needed for pain. (Patient not taking: Reported on 11/03/2022) 60 tablet 0   No facility-administered medications prior to visit.   Allergies  Allergen Reactions   Percocet [Oxycodone-Acetaminophen] Other (See Comments)    Hallucinations, paranoia     Objective:   Today's Vitals  11/03/22 1513  BP: 118/72  Pulse: 91  Temp: 98.7 F (37.1 C)  SpO2: 96%  Weight: (!) 339 lb 6.4 oz (154 kg)   Body mass index is 54.78 kg/m.   General: Well developed, well nourished. No acute distress. HEENT: Normocephalic, non-traumatic. PERRL, EOMI. Conjunctiva clear. Fundiscopic exam appears to show a   normal optic disc without papilledema. External ears normal. EAC and TMs normal bilaterally. Nose    clear without congestion or rhinorrhea. Mucous membranes moist. Oropharynx clear. Good dentition. Neuro: No focal neurological deficits. Psych:  Alert and oriented. Normal mood and affect.  Health Maintenance Due  Topic Date Due   Zoster Vaccines- Shingrix (1 of 2) Never done   COVID-19 Vaccine (4 - 2023-24 season) 11/11/2021   INFLUENZA VACCINE  10/12/2022     Assessment & Plan:   Problem List Items Addressed This Visit       Other   Positional headache - Primary    By history, it sounds like Ms. Borowiak has had previous issues with idiopathic intercranial hypertension (pseudotumor cerebri). She is not having visual symptoms currently, but is having positional headaches. Her obesity likely contributes to this. I will refer her to neurology to consider if she needs an LP. I recommend she see her eye doctor for a dilated eye exam. She can continue Tylenol and ibuprofen for now for her headaches.      Relevant Medications   methocarbamol (ROBAXIN) 500 MG tablet   Other Relevant Orders   Ambulatory referral to Neurology   Vitamin D insufficiency    I will recheck her Vitamin D level to see if this has now corrected.      Relevant Orders   VITAMIN D 25 Hydroxy (Vit-D Deficiency, Fractures)   Other Visit Diagnoses     Muscle pain       Notes Flexeril is makign her drowsy. Requests a trial fo Robaxin.   Relevant Medications   methocarbamol (ROBAXIN) 500 MG tablet       Return for Follow-up as scheduled.   Loyola Mast, MD

## 2022-11-04 LAB — VITAMIN D 25 HYDROXY (VIT D DEFICIENCY, FRACTURES): Vit D, 25-Hydroxy: 31 ng/mL (ref 30–100)

## 2022-11-07 ENCOUNTER — Ambulatory Visit (INDEPENDENT_AMBULATORY_CARE_PROVIDER_SITE_OTHER): Payer: 59 | Admitting: Family Medicine

## 2022-11-07 DIAGNOSIS — G4733 Obstructive sleep apnea (adult) (pediatric): Secondary | ICD-10-CM | POA: Diagnosis not present

## 2022-11-10 ENCOUNTER — Other Ambulatory Visit (HOSPITAL_COMMUNITY): Payer: Self-pay

## 2022-11-10 ENCOUNTER — Ambulatory Visit: Payer: 59 | Admitting: Dietician

## 2022-11-14 ENCOUNTER — Telehealth: Payer: Self-pay | Admitting: Family Medicine

## 2022-11-14 DIAGNOSIS — R51 Headache with orthostatic component, not elsewhere classified: Secondary | ICD-10-CM

## 2022-11-14 NOTE — Telephone Encounter (Signed)
Dr Veto Kemps has given pt a referral to a neurologist and they are needing a certain eye scan. She is having great difficulty with getting a new scan. She does not know what to do at this pt. Please advise pt at [phone]

## 2022-11-15 ENCOUNTER — Encounter: Payer: Self-pay | Admitting: Family Medicine

## 2022-11-16 ENCOUNTER — Other Ambulatory Visit (HOSPITAL_BASED_OUTPATIENT_CLINIC_OR_DEPARTMENT_OTHER): Payer: Self-pay

## 2022-11-16 NOTE — Telephone Encounter (Signed)
Called patient, she was told by Neurology that she need to have an updated dilated Eye Exam before they make an appointment for her.  Last Eye exam was 3 months ago and her eye doctor states she needs a referral to do another one.   Called Neurology, she will send a message to Dr Everlena Cooper for a diangosis for the second eye exam to be done so we can send a referral and get insurance to cover this. Dm/cma

## 2022-11-17 ENCOUNTER — Other Ambulatory Visit: Payer: Self-pay | Admitting: Family Medicine

## 2022-11-17 DIAGNOSIS — R51 Headache with orthostatic component, not elsewhere classified: Secondary | ICD-10-CM

## 2022-11-20 ENCOUNTER — Ambulatory Visit: Payer: 59 | Admitting: Family Medicine

## 2022-11-20 NOTE — Addendum Note (Signed)
Addended by: Loyola Mast on: 11/20/2022 01:22 PM   Modules accepted: Orders

## 2022-11-20 NOTE — Telephone Encounter (Signed)
Spoke to patient and she goes to [facility] located on [address] [facility]  Can you place the referral so they will do another dilated eye exam sincelast one was 3 months ago. Dm/cma

## 2022-11-21 ENCOUNTER — Encounter: Payer: Self-pay | Admitting: Internal Medicine

## 2022-11-21 ENCOUNTER — Ambulatory Visit: Payer: 59 | Admitting: Internal Medicine

## 2022-11-21 VITALS — BP 138/70 | HR 85 | Temp 98.2°F | Ht 66.0 in | Wt 344.2 lb

## 2022-11-21 DIAGNOSIS — N950 Postmenopausal bleeding: Secondary | ICD-10-CM | POA: Diagnosis not present

## 2022-11-21 LAB — CBC WITH DIFFERENTIAL/PLATELET
Basophils Absolute: 0.1 10*3/uL (ref 0.0–0.1)
Basophils Relative: 0.8 % (ref 0.0–3.0)
Eosinophils Absolute: 0.2 10*3/uL (ref 0.0–0.7)
Eosinophils Relative: 2.1 % (ref 0.0–5.0)
HCT: 40.5 % (ref 36.0–46.0)
Hemoglobin: 13 g/dL (ref 12.0–15.0)
Lymphocytes Relative: 26.1 % (ref 12.0–46.0)
Lymphs Abs: 2.6 10*3/uL (ref 0.7–4.0)
MCHC: 32.1 g/dL (ref 30.0–36.0)
MCV: 96.9 fl (ref 78.0–100.0)
Monocytes Absolute: 0.5 10*3/uL (ref 0.1–1.0)
Monocytes Relative: 4.7 % (ref 3.0–12.0)
Neutro Abs: 6.6 10*3/uL (ref 1.4–7.7)
Neutrophils Relative %: 66.3 % (ref 43.0–77.0)
Platelets: 312 10*3/uL (ref 150.0–400.0)
RBC: 4.19 Mil/uL (ref 3.87–5.11)
RDW: 14.9 % (ref 11.5–15.5)
WBC: 9.9 10*3/uL (ref 4.0–10.5)

## 2022-11-21 NOTE — Progress Notes (Signed)
[facility] [address] [place] [address] Dept: [phone] Dept Fax: [phone]  Acute Care Office Visit  Subjective:   Diana Garrison 02-25-1971 11/21/2022  Chief Complaint  Patient presents with   Dysmenorrhea    HPI: DESHAWN MABRAY is a 52 yo F who presents complaining of heavy vaginal bleeding.  Patient reports on October 25, 2022 she began experiencing lower abdominal cramping. Two days later she began having heavy vaginal bleeding.  Patient reports she is soaking through ultra sized tampon within 1 hour.  Reports going through approximately 8 ultra sized tampons and 3 heavy flow pads within an 8-hour day.  Associated dizziness due to blood loss. Associated dime size and quarter size blood clots.  Denies syncope, nausea vomiting, urinary symptoms.  She has taken ibuprofen with little relief of cramping.  She currently does not have an OB/GYN, but has called and as a new patient appointment on September 26. She is up-to-date on her Pap smear.  Last Pap was 09/20/2022 which was normal. No new medications or medication changes.  She is postmenopausal, last period reported by patient was in 2022. She is sexually active with her husband, no dyspareunia.   The following portions of the patient's history were reviewed and updated as appropriate: past medical history, past surgical history, family history, social history, allergies, medications, and problem list.   Patient Active Problem List   Diagnosis Date Noted   Postmenopausal vaginal bleeding 11/21/2022   Positional headache 11/03/2022   Decreased libido 09/20/2022   OSA on CPAP 06/27/2022   Polyphagia 05/16/2022   Morbid obesity (HCC) with starting BMI 54 01/03/2022   Pre-diabetes 12/05/2021   Other hyperlipidemia 11/01/2021   Depression 11/01/2021   Seasonal allergic rhinitis 10/26/2021   Hyperlipidemia 10/26/2021   Vitamin D insufficiency 10/20/2021    Moderate episode of recurrent major depressive disorder (HCC) 08/16/2021   Right knee meniscal tear 08/16/2021   Spider veins of both lower extremities 10/17/2016   Tobacco use disorder 06/06/2013   Past Medical History:  Diagnosis Date   Hyperlipidemia    [employer]reesia 11/12/2019   Past Surgical History:  Procedure Laterality Date   CESAREAN SECTION N/A    [employer]reesia 11/12/2019   GANGLION CYST EXCISION Bilateral    x2   Family History  Problem Relation Age of Onset   Obesity Father    Cirrhosis Father    Pulmonary embolism Sister    Lupus Brother    Breast cancer Neg Hx     Current Outpatient Medications:    magnesium 30 MG tablet, Take 1 tablet (30 mg total) by mouth 2 (two) times daily., Disp: 60 tablet, Rfl: 0   meloxicam (MOBIC) 15 MG tablet, Take 1 tablet (15 mg total) by mouth daily., Disp: 90 tablet, Rfl: 1   methocarbamol (ROBAXIN) 500 MG tablet, Take 1 tablet (500 mg total) by mouth 4 (four) times daily., Disp: 60 tablet, Rfl: 1   Multiple Vitamin (MULTIVITAMIN) tablet, Take 1 tablet by mouth daily., Disp: , Rfl:    rosuvastatin (CRESTOR) 20 MG tablet, Take 1 tablet by mouth daily., Disp: 90 tablet, Rfl: 3   zinc gluconate 50 MG tablet, Take 1 tablet (50 mg total) by mouth daily., Disp: 30 tablet, Rfl: 0   diclofenac (VOLTAREN) 75 MG EC tablet, Take 1 tablet (75 mg total) by mouth 2 (two) times daily as needed for pain. (Patient not taking: Reported on 11/21/2022), Disp: 60 tablet, Rfl: 0   Vitamin  D, Ergocalciferol, (DRISDOL) 1.25 MG (50000 UNIT) CAPS capsule, Take 1 capsule (50,000 Units total) by mouth every 7 (seven) days. (Patient not taking: Reported on 11/21/2022), Disp: 5 capsule, Rfl: 0 Allergies  Allergen Reactions   Percocet [Oxycodone-Acetaminophen] Other (See Comments)    Hallucinations, paranoia     ROS: A complete ROS was performed with pertinent positives/negatives noted in the HPI. The remainder of the ROS are negative.    Objective:   Today's Vitals    11/21/22 1352  BP: 138/70  Pulse: 85  Temp: 98.2 F (36.8 C)  TempSrc: Temporal  SpO2: 97%  Weight: (!) 344 lb 3.2 oz (156.1 kg)  Height: 5\' 6"  (1.676 m)    GENERAL: Well-appearing, in NAD. Well nourished.  SKIN: Pink, warm and dry. No rash, lesion. NECK: Trachea midline. Full ROM w/o pain or tenderness. No lymphadenopathy.  RESPIRATORY: Chest wall symmetrical. Respirations even and non-labored. Breath sounds clear to auscultation bilaterally.  CARDIAC: S1, S2 present, regular rate and rhythm. Peripheral pulses 2+ bilaterally.  GI: Abdomen soft, non-tender. Normoactive bowel sounds. No CVA tenderness.  EXTREMITIES: Without clubbing, cyanosis, or edema.  NEUROLOGIC: Steady, even gait.  PSYCH/MENTAL STATUS: Alert, oriented x 3. Cooperative, appropriate mood and affect.    No results found for any visits on 11/21/22.    Assessment & Plan:  1. Postmenopausal vaginal bleeding - CBC w/Diff - Comp Met (CMET) - TSH - Protime-INR; Future - US Pelvic Complete With Transvaginal; Future - patient refused urine pregnancy and urinalysis. She states "I'm not pregnant."   Upon discussion with patient for plan of care for evaluation of postmenopausal vaginal bleeding, patient became very upset and stated "this is a waste of time." "  You can't just give me any medication?" " So I have to go on a couple more weeks like this?"  Provider discussed that lab work, urine sample, pelvic exam, and ultrasound need to be conducted to determine possible cause of vaginal bleeding, and it would not be appropriate to prescribe hormone therapy without determining the cause first, as it could worsen her symptoms.  Patient continually states "this is a waste of time."  "I will just go to an urgent care."  Patient was advised by provider that urgent care would perform similar testing, and if she were to seek alternative care, that I would would recommend going to the emergency room where they can do emergent imaging  and consultation to OB/GYN if needed. She stated "I will just get the blood work."  Patient then stopped talking to provider altogether and would not make any further eye contact or interaction.    No orders of the defined types were placed in this encounter.  Orders Placed This Encounter  Procedures   US Pelvic Complete With Transvaginal    Standing Status:   Future    Standing Expiration Date:   11/21/2023    Order Specific Question:   Reason for Exam (SYMPTOM  OR DIAGNOSIS REQUIRED)    Answer:   post menopausal heavy vaginal bleeding    Order Specific Question:   Preferred imaging location?    Answer:   Internal   CBC w/Diff   Comp Met (CMET)   TSH   Protime-INR    Standing Status:   Future    Standing Expiration Date:   11/21/2023   Lab Orders         CBC w/Diff         Comp Met (CMET)  TSH         Protime-INR     No images are attached to the encounter or orders placed in the encounter.  Salvatore Decent, FNP

## 2022-11-22 ENCOUNTER — Encounter: Payer: Self-pay | Admitting: Internal Medicine

## 2022-11-22 LAB — TSH: TSH: 1.04 u[IU]/mL (ref 0.35–5.50)

## 2022-11-22 LAB — COMPREHENSIVE METABOLIC PANEL
ALT: 21 U/L (ref 0–35)
AST: 19 U/L (ref 0–37)
Albumin: 3.7 g/dL (ref 3.5–5.2)
Alkaline Phosphatase: 90 U/L (ref 39–117)
BUN: 14 mg/dL (ref 6–23)
CO2: 26 meq/L (ref 19–32)
Calcium: 8.9 mg/dL (ref 8.4–10.5)
Chloride: 106 meq/L (ref 96–112)
Creatinine, Ser: 0.78 mg/dL (ref 0.40–1.20)
GFR: 87.72 mL/min (ref >60.00)
Glucose, Bld: 119 mg/dL — ABNORMAL HIGH (ref 70–99)
Potassium: 4.2 meq/L (ref 3.5–5.1)
Sodium: 138 meq/L (ref 135–145)
Total Bilirubin: 0.3 mg/dL (ref 0.2–1.2)
Total Protein: 6.2 g/dL (ref 6.0–8.3)

## 2022-11-23 ENCOUNTER — Ambulatory Visit: Payer: 59 | Admitting: Family Medicine

## 2022-11-24 ENCOUNTER — Other Ambulatory Visit (HOSPITAL_COMMUNITY): Payer: Self-pay

## 2022-12-05 ENCOUNTER — Encounter: Payer: Self-pay | Admitting: Family Medicine

## 2022-12-05 ENCOUNTER — Encounter: Payer: Self-pay | Admitting: Internal Medicine

## 2022-12-08 ENCOUNTER — Other Ambulatory Visit: Payer: Self-pay

## 2022-12-08 ENCOUNTER — Other Ambulatory Visit (HOSPITAL_COMMUNITY): Payer: Self-pay

## 2022-12-08 DIAGNOSIS — D509 Iron deficiency anemia, unspecified: Secondary | ICD-10-CM | POA: Diagnosis not present

## 2022-12-08 DIAGNOSIS — N950 Postmenopausal bleeding: Secondary | ICD-10-CM | POA: Diagnosis not present

## 2022-12-08 DIAGNOSIS — G4733 Obstructive sleep apnea (adult) (pediatric): Secondary | ICD-10-CM | POA: Diagnosis not present

## 2022-12-08 MED ORDER — MEDROXYPROGESTERONE ACETATE 5 MG PO TABS
5.0000 mg | ORAL_TABLET | Freq: Every day | ORAL | 1 refills | Status: DC
  Filled 2022-12-08: qty 30, 30d supply, fill #0
  Filled 2023-01-03: qty 30, 30d supply, fill #1

## 2022-12-12 ENCOUNTER — Ambulatory Visit
Admission: RE | Admit: 2022-12-12 | Discharge: 2022-12-12 | Disposition: A | Discharge status: Home / Self Care | Payer: 59 | Source: Ambulatory Visit | Attending: Internal Medicine | Admitting: Internal Medicine

## 2022-12-12 DIAGNOSIS — N950 Postmenopausal bleeding: Secondary | ICD-10-CM | POA: Diagnosis not present

## 2022-12-12 DIAGNOSIS — R519 Headache, unspecified: Secondary | ICD-10-CM | POA: Diagnosis not present

## 2022-12-12 LAB — HM DIABETES EYE EXAM

## 2022-12-13 ENCOUNTER — Encounter: Payer: Self-pay | Admitting: Internal Medicine

## 2022-12-14 ENCOUNTER — Encounter: Payer: Self-pay | Admitting: Family Medicine

## 2022-12-14 DIAGNOSIS — R9389 Abnormal findings on diagnostic imaging of other specified body structures: Secondary | ICD-10-CM | POA: Insufficient documentation

## 2022-12-15 ENCOUNTER — Encounter: Payer: Self-pay | Admitting: Neurology

## 2022-12-18 NOTE — Progress Notes (Unsigned)
Initial neurology clinic note  Diana Garrison MRN: [id number] DOB: 1970/05/08  Referring provider: Loyola Mast, MD  Primary care provider: Loyola Mast, MD  Reason for consult:  Headache  Subjective:  This is Diana Garrison, a 52 y.o. right-handed female with a medical history of OSA (not currently on CPAP), obesity, pre-DM, HLD, vit D deficiency, depression who presents to neurology clinic with nocturnal headaches. The patient is alone today.  Patient's headaches occur only at night. When she sits up, she thinks it slowly improves. She wakes up from sleep. She does not think she has any visual symptoms. Patient is taking ibuprofen, alleve, tylenol. She is taking it every 2 hours during the night. Symptoms typically last 30 minutes and is typically is located on the front of her head. She rates the pain as 10/10. It feels like an intense pressure, like someone is stepping on her head. She feels like blood is pulsating. She denies photophobia, phonophobia, nausea, vomiting. She has the headache 2-3 times per week. She does not have symptoms during the day. She denies any significant neck pain.  About 20 years ago she had headaches and double vision. She saw a neurologist and had a lumbar puncture with high volume tap. After this, her visual issue was rapidly corrected. Due to concern for IIH, patient was sent to ophtho. Patient was seen at [facility] on 12/12/22 and had dilated fundoscopy that reports normal optic disc bilaterally (no papilledema).  Patient has OSA and is supposed to be on CPAP. She has not been using it for the last few months though. She only feels tired in the morning when she has had a headache during the night. She rarely naps, only on off days, but does not get headaches at that time.  She is currently on robaxin and mobic for knee pain.   Birth control? Patient takes progesterone for heavy bleeding/pre-menopause Smoker? 6 cigarettes per day EtOH use:  2 glasses of wine per week Restrictive diet: No. She denies any recent weight changes Caffeine use: None currently   MEDICATIONS:  Outpatient Encounter Medications as of 12/20/2022  Medication Sig   magnesium 30 MG tablet Take 1 tablet (30 mg total) by mouth 2 (two) times daily. (Patient taking differently: Take 30 mg by mouth once.)   medroxyPROGESTERone (PROVERA) 5 MG tablet Take 1 tablet (5 mg total) by mouth daily.   meloxicam (MOBIC) 15 MG tablet Take 1 tablet (15 mg total) by mouth daily.   methocarbamol (ROBAXIN) 500 MG tablet Take 1 tablet (500 mg total) by mouth 4 (four) times daily.   Multiple Vitamin (MULTIVITAMIN) tablet Take 1 tablet by mouth daily.   rosuvastatin (CRESTOR) 20 MG tablet Take 1 tablet by mouth daily.   zinc gluconate 50 MG tablet Take 1 tablet (50 mg total) by mouth daily.   diclofenac (VOLTAREN) 75 MG EC tablet Take 1 tablet (75 mg total) by mouth 2 (two) times daily as needed for pain. (Patient not taking: Reported on 11/21/2022)   Vitamin D, Ergocalciferol, (DRISDOL) 1.25 MG (50000 UNIT) CAPS capsule Take 1 capsule (50,000 Units total) by mouth every 7 (seven) days. (Patient not taking: Reported on 11/21/2022)   No facility-administered encounter medications on file as of 12/20/2022.    PAST MEDICAL HISTORY: Past Medical History:  Diagnosis Date   Hyperlipidemia    Phreesia 11/12/2019    PAST SURGICAL HISTORY: Past Surgical History:  Procedure Laterality Date   CESAREAN SECTION N/A  Phreesia 11/12/2019   GANGLION CYST EXCISION Bilateral    x2    ALLERGIES: Allergies  Allergen Reactions   Percocet [Oxycodone-Acetaminophen] Other (See Comments)    Hallucinations, paranoia    FAMILY HISTORY: Family History  Problem Relation Age of Onset   Obesity Father    Cirrhosis Father    Pulmonary embolism Sister    Lupus Brother    Breast cancer Neg Hx     SOCIAL HISTORY: Social History   Tobacco Use   Smoking status: Every Day    Current  packs/day: 0.25    Average packs/day: 0.3 packs/day for 15.2 years (3.8 ttl pk-yrs)    Types: Cigarettes    Start date: 10/10/2007    Passive exposure: Never   Smokeless tobacco: Never   Tobacco comments:    6 cigs per day 04/14/21.   Vaping Use   Vaping status: Never Used  Substance Use Topics   Alcohol use: Yes    Comment: 2 drinks per week.   Drug use: Not Currently   Social History   Social History Narrative   Are you right handed or left handed? Right   Are you currently employed ?    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   Who lives with you? husband   What type of home do you live in: 1 story or 2 story? one   Caffeine 1 time a week      Objective:  Vital Signs:  BP 138/85   Pulse 89   Ht 5' 6.5" (1.689 m)   Wt (!) 348 lb (157.9 kg)   LMP 10/25/2022 Comment: cramping spot, light- heavy  SpO2 97%   BMI 55.33 kg/m   General: No acute distress.  Patient appears well-groomed.   Head:  Normocephalic/atraumatic Eyes:  fundi examined, disc margins clear, no obvious papilledema Neck: supple, no paraspinal tenderness, full range of motion Back: No paraspinal tenderness Heart: regular rate and rhythm Lungs: Clear to auscultation bilaterally. Vascular: No carotid bruits.  Neurological Exam: Mental status: alert and oriented, speech fluent and not dysarthric, language intact.  Cranial nerves: CN I: not tested CN II: pupils equal, round and reactive to light, visual fields intact CN III, IV, VI:  full range of motion, no nystagmus, no ptosis CN V: facial sensation intact. CN VII: upper and lower face symmetric CN VIII: hearing intact CN IX, X: uvula midline CN XI: sternocleidomastoid and trapezius muscles intact CN XII: tongue midline  Bulk & Tone: normal, no fasciculations. Motor:  muscle strength 5/5 throughout Deep Tendon Reflexes:  2+ throughout.   Sensation:  Light touch sensation intact. Finger to nose testing:   Without dysmetria.     Gait:  Normal station and stride.  Labs and Imaging review: Internal labs: Lab Results  Component Value Date   HGBA1C 6.1 (H) 09/04/2022   Lab Results  Component Value Date   VITAMINB12 1,000 10/18/2021   Lab Results  Component Value Date   TSH 1.04 11/21/2022   11/21/22: CMP significant for glucose of 119 CBC w/ diff significant for MCV of 96.9  Vit D (11/03/22): 31 (wnl)  Ferritin (10/18/21): 34  Imaging: MRI lumbar spine wo contrast (08/11/2013): FINDINGS:  Last fully open disk space is labeled L5-S1. Present examination  incorporates from T11-12 disc space through the lower sacrum.   Conus just below the T12-L1 disc space.   Right ovary 2.4 x 1.8 cm cyst/follicle may be present although  incompletely assessed.   T11-12 through L1-2 unremarkable.   L2-3:  Minimal facet joint degenerative changes.   L3-4: Minimal right-sided and mild left-sided facet joint  degenerative changes.   L4-5:  Minimal facet joint degenerative changes.   L5-S1:  Minimal facet joint degenerative changes.   Decreased signal intensity of bone marrow may be related to  patient's habitus. Correlation with CBC to exclude anemia  contributing to this appearance may be considered   IMPRESSION:  No evidence of lumbar disc herniation, spinal stenosis or foraminal  narrowing.   Minimal to mild facet joint degenerative changes most notable on the  left at the L3-4 level.   Decreased signal intensity of bone marrow may be related to  patient's habitus. Correlation with CBC to exclude anemia  contributing to this appearance may be considered.   EMG (04/08/19): NCV & EMG Findings: 2 electrodiagnostic testing of the left upper extremity shows:  Left median sensory response shows prolonged latency (6.5 ms) and reduced amplitude (7.4 V). Left median motor response shows prolonged latency (5.9 ms).  Left ulnar motor responses within normal limits. There is no evidence of  active or chronic motor axonal loss changes affecting any of the tested muscles.  Motor unit configuration and recruitment pattern is within normal limits.   Impression: Left median neuropathy at or distal to the wrist (moderate-to-severe) consistent with a clinical diagnosis of carpal tunnel syndrome  Sleep study result Date of study: 05/11/2021  Impression: Moderately severe obstructive sleep apnea Moderate oxygen desaturations  Recommendation: DME referral  Recommend CPAP therapy for moderate obstructive sleep apnea  Auto titrating CPAP with pressure settings of 5-20 will be appropriate  Encourage weight loss measures   Assessment/Plan:  Diana Garrison is a 52 y.o. female who presents for evaluation of nocturnal headaches. She has a relevant medical history of OSA (not currently on CPAP), obesity, pre-DM, HLD, vit D deficiency, depression. Her neurological examination is essentially normal today, including non-dilated fundoscopy. Given her history of prior headaches and vision changes that responded well to lumbar puncture and the positional nature of her headaches, idiopathic intracranial hypertension is the most likely etiology of her headaches. We discussed repeating LP, but patient was not interested given the pain she experienced previously. She would rather empirically treat. She also has OSA and was unable to tolerate CPAP. This could also be contributing to headaches.  PLAN: -Discussed LP with opening pressure, but patient would prefer to treat empirically for IIH -Discussed Diamox and Topamax. Patient preferred to try Topamax. Will start 25 mg daily for 1 week, then increase 50 mg thereafter. -Consider sleeping more with head elevated -Patient to reach out to her sleep medicine doctor to discuss other options for OSA as she has been unable to tolerate CPAP  -Return to clinic in 3 months  The impression above as well as the plan as outlined below were extensively  discussed with the patient who voiced understanding. All questions were answered to their satisfaction.  When available, results of the above investigations and possible further recommendations will be communicated to the patient via telephone/MyChart. Patient to call office if not contacted after expected testing turnaround time.   Thank you for allowing me to participate in patient's care.  If I can answer any additional questions, I would be pleased to do so.  Jacquelyne Balint, MD   CC: Veto Kemps Bertram Millard, MD [address] [place] [address]  CC: Referring provider: Loyola Mast, MD [address]  [place],  [place] [address]

## 2022-12-20 ENCOUNTER — Encounter: Payer: Self-pay | Admitting: Neurology

## 2022-12-20 ENCOUNTER — Other Ambulatory Visit (HOSPITAL_COMMUNITY): Payer: Self-pay

## 2022-12-20 ENCOUNTER — Ambulatory Visit (INDEPENDENT_AMBULATORY_CARE_PROVIDER_SITE_OTHER): Payer: 59 | Admitting: Neurology

## 2022-12-20 VITALS — BP 138/85 | HR 89 | Ht 66.5 in | Wt 348.0 lb

## 2022-12-20 DIAGNOSIS — R51 Headache with orthostatic component, not elsewhere classified: Secondary | ICD-10-CM

## 2022-12-20 DIAGNOSIS — G932 Benign intracranial hypertension: Secondary | ICD-10-CM | POA: Diagnosis not present

## 2022-12-20 DIAGNOSIS — G4733 Obstructive sleep apnea (adult) (pediatric): Secondary | ICD-10-CM

## 2022-12-20 MED ORDER — TOPIRAMATE 25 MG PO TABS
50.0000 mg | ORAL_TABLET | Freq: Every day | ORAL | 3 refills | Status: DC
Start: 2022-12-20 — End: 2023-01-16
  Filled 2022-12-20 (×2): qty 120, 60d supply, fill #0
  Filled 2023-01-03: qty 120, 60d supply, fill #1

## 2022-12-20 NOTE — Patient Instructions (Signed)
I saw you today for the headaches. This could be due to 1 or 2 things. First is increased pressure in your head due to fluid not draining well around the brain. This may be why the lumbar puncture helped symptoms in the past.  Your sleep apnea is also likely at least contributing. I want you to talk to your sleep doctor about other options since you don't like your CPAP.  For the increased pressure, we discussed options like lumbar puncture, but you preferred to try medications. I have prescribed Topamax for this. You will take 25 mg every day for 1 week, then increase to 50 mg thereafter.  Please let me know if you have any problems getting the medication or problems with the medication.  You may also want to sleep with your head more elevated as this can relieve the pressure when you lay down.  I will see you back in clinic to check on your progress in about 3 months. Please let me know if you have any questions or concerns in the meantime.  The physicians and staff at [facility] are committed to providing excellent care. You may receive a survey requesting feedback about your experience at our office. We strive to receive "very good" responses to the survey questions. If you feel that your experience would prevent you from giving the office a "very good " response, please contact our office to try to remedy the situation. We may be reached at [phone]. Thank you for taking the time out of your busy day to complete the survey.  Jacquelyne Balint, MD [facility]

## 2023-01-02 ENCOUNTER — Ambulatory Visit: Payer: 59 | Admitting: Dietician

## 2023-01-03 ENCOUNTER — Other Ambulatory Visit: Payer: Self-pay | Admitting: Family Medicine

## 2023-01-03 ENCOUNTER — Other Ambulatory Visit: Payer: Self-pay | Admitting: Registered Nurse

## 2023-01-03 DIAGNOSIS — E7800 Pure hypercholesterolemia, unspecified: Secondary | ICD-10-CM

## 2023-01-03 DIAGNOSIS — M25561 Pain in right knee: Secondary | ICD-10-CM

## 2023-01-04 ENCOUNTER — Other Ambulatory Visit (HOSPITAL_COMMUNITY): Payer: Self-pay

## 2023-01-04 ENCOUNTER — Other Ambulatory Visit (HOSPITAL_BASED_OUTPATIENT_CLINIC_OR_DEPARTMENT_OTHER): Payer: Self-pay

## 2023-01-04 ENCOUNTER — Other Ambulatory Visit: Payer: Self-pay

## 2023-01-04 MED ORDER — MELOXICAM 15 MG PO TABS
15.0000 mg | ORAL_TABLET | Freq: Every day | ORAL | 1 refills | Status: DC
  Filled 2023-01-04: qty 90, 90d supply, fill #0
  Filled 2023-01-24 – 2023-05-15 (×2): qty 90, 90d supply, fill #1

## 2023-01-07 DIAGNOSIS — G4733 Obstructive sleep apnea (adult) (pediatric): Secondary | ICD-10-CM | POA: Diagnosis not present

## 2023-01-11 ENCOUNTER — Ambulatory Visit: Payer: 59 | Admitting: Neurology

## 2023-01-16 ENCOUNTER — Telehealth: Payer: Self-pay | Admitting: Neurology

## 2023-01-16 ENCOUNTER — Other Ambulatory Visit: Payer: Self-pay | Admitting: Registered Nurse

## 2023-01-16 ENCOUNTER — Other Ambulatory Visit (HOSPITAL_COMMUNITY): Payer: Self-pay

## 2023-01-16 ENCOUNTER — Other Ambulatory Visit: Payer: Self-pay | Admitting: Neurology

## 2023-01-16 DIAGNOSIS — R51 Headache with orthostatic component, not elsewhere classified: Secondary | ICD-10-CM

## 2023-01-16 DIAGNOSIS — G932 Benign intracranial hypertension: Secondary | ICD-10-CM

## 2023-01-16 DIAGNOSIS — E7800 Pure hypercholesterolemia, unspecified: Secondary | ICD-10-CM

## 2023-01-16 MED ORDER — TOPIRAMATE 25 MG PO TABS
75.0000 mg | ORAL_TABLET | Freq: Every day | ORAL | 5 refills | Status: DC
  Filled 2023-01-16 – 2023-02-13 (×5): qty 90, 30d supply, fill #0
  Filled 2023-03-27: qty 90, 30d supply, fill #1
  Filled 2023-05-15: qty 90, 30d supply, fill #2
  Filled 2023-07-19: qty 90, 30d supply, fill #3
  Filled 2023-09-13: qty 90, 30d supply, fill #4
  Filled 2023-11-01: qty 90, 30d supply, fill #5

## 2023-01-16 NOTE — Telephone Encounter (Signed)
Called pt and reported Dr. Jorge Ny answer. She understood

## 2023-01-16 NOTE — Telephone Encounter (Signed)
Patient states provider told her if headache continues he would up her  topiramate (TOPAMAX) 25 MG tablet. She says they have not decreased and would like the increase of medication.

## 2023-01-17 ENCOUNTER — Ambulatory Visit: Payer: 59 | Admitting: Family Medicine

## 2023-01-17 ENCOUNTER — Other Ambulatory Visit (HOSPITAL_COMMUNITY): Payer: Self-pay

## 2023-01-17 ENCOUNTER — Encounter: Payer: Self-pay | Admitting: Family Medicine

## 2023-01-17 VITALS — BP 134/82 | HR 82 | Temp 97.9°F | Ht 66.5 in | Wt 347.4 lb

## 2023-01-17 DIAGNOSIS — M5431 Sciatica, right side: Secondary | ICD-10-CM | POA: Diagnosis not present

## 2023-01-17 MED ORDER — PREDNISONE 20 MG PO TABS
40.0000 mg | ORAL_TABLET | Freq: Every day | ORAL | 0 refills | Status: DC
  Filled 2023-01-17 (×2): qty 7, 3d supply, fill #0

## 2023-01-17 NOTE — Progress Notes (Signed)
[facility] [address] [place] [address] Dept: [phone] Dept Fax: [phone]  Office Visit  Subjective:    Patient ID: Diana Garrison, female    DOB: October 11, 1970, 52 y.o..   MRN: [id number]  Chief Complaint  Patient presents with   Back Pain    C/o having back pain that is radiating into the RT leg since 12/28/22.  Has been using Ibuprofen, Tylenol, and prescribed medications.     History of Present Illness:  Patient is in today complaining of acute pain in her right lower back radiating down her right buttocks and posterior thigh. She notes the pain started after she took a recent plane flight on [employer]. She states the seats would not recline and were very thin. This flight was in mid-October. The pain worsened yesterday, after she had a car ride down to [place] ([place], [place], [place]). She has had sciatica issues in the past. She did take her meloxicam over the past two weeks, but this has not been relieving her pain.  Past Medical History: Patient Active Problem List   Diagnosis Date Noted   Thickened endometrium 12/14/2022   Postmenopausal vaginal bleeding 11/21/2022   Positional headache 11/03/2022   Decreased libido 09/20/2022   OSA on CPAP 06/27/2022   Polyphagia 05/16/2022   Morbid obesity (HCC) with starting BMI 54 01/03/2022   Pre-diabetes 12/05/2021   Other hyperlipidemia 11/01/2021   Depression 11/01/2021   Seasonal allergic rhinitis 10/26/2021   Hyperlipidemia 10/26/2021   Vitamin D insufficiency 10/20/2021   Moderate episode of recurrent major depressive disorder (HCC) 08/16/2021   Right knee meniscal tear 08/16/2021   Spider veins of both lower extremities 10/17/2016   Tobacco use disorder 06/06/2013   Past Surgical History:  Procedure Laterality Date   CESAREAN SECTION N/A    Phreesia 11/12/2019   GANGLION CYST EXCISION Bilateral    x2   Family History   Problem Relation Age of Onset   Obesity Father    Cirrhosis Father    Pulmonary embolism Sister    Lupus Brother    Breast cancer Neg Hx    Outpatient Medications Prior to Visit  Medication Sig Dispense Refill   diclofenac (VOLTAREN) 75 MG EC tablet Take 1 tablet (75 mg total) by mouth 2 (two) times daily as needed for pain. 60 tablet 0   magnesium 30 MG tablet Take 1 tablet (30 mg total) by mouth 2 (two) times daily. (Patient taking differently: Take 30 mg by mouth once.) 60 tablet 0   medroxyPROGESTERone (PROVERA) 5 MG tablet Take 1 tablet (5 mg total) by mouth daily. 30 tablet 1   meloxicam (MOBIC) 15 MG tablet Take 1 tablet (15 mg total) by mouth daily. 90 tablet 1   methocarbamol (ROBAXIN) 500 MG tablet Take 1 tablet (500 mg total) by mouth 4 (four) times daily. 60 tablet 1   Multiple Vitamin (MULTIVITAMIN) tablet Take 1 tablet by mouth daily.     rosuvastatin (CRESTOR) 20 MG tablet Take 1 tablet by mouth daily. 90 tablet 3   topiramate (TOPAMAX) 25 MG tablet Take 3 tablets (75 mg total) by mouth daily. 90 tablet 5   zinc gluconate 50 MG tablet Take 1 tablet (50 mg total) by mouth daily. 30 tablet 0   No facility-administered medications prior to visit.   Allergies  Allergen Reactions   Percocet [Oxycodone-Acetaminophen] Other (See Comments)    Hallucinations, paranoia     Objective:   Today's Vitals  01/17/23 1300  BP: 134/82  Pulse: 82  Temp: 97.9 F (36.6 C)  TempSrc: Temporal  SpO2: 100%  Weight: (!) 347 lb 6.4 oz (157.6 kg)  Height: 5' 6.5" (1.689 m)   Body mass index is 55.23 kg/m.   General: Well developed, well nourished. No acute distress. Extremities: FROM of the right leg, but painful ambulation. SLR +/- on the right. Strength 5/5. Neuro: No focal neurological deficits. DTR in LE 2+ bilat. Psych: Alert and oriented. Normal mood and affect.  There are no preventive care reminders to display for this patient.    Assessment & Plan:   Problem List  Items Addressed This Visit   None Visit Diagnoses     Sciatica of right side    -  Primary   Recommend relative rest, heat, stretches, and I will prescribe a course of prednisone. Follow-up if not improved.   Relevant Medications   predniSONE (DELTASONE) 20 MG tablet       Return if symptoms worsen or fail to improve.   Loyola Mast, MD

## 2023-01-17 NOTE — Patient Instructions (Signed)
Sciatica  Sciatica is pain, numbness, weakness, or tingling along the path of the sciatic nerve. The sciatic nerve starts in the lower back and runs down the back of each leg. The nerve controls the muscles in the lower leg and in the back of the knee. It also provides feeling (sensation) to the back of the thigh, the lower leg, and the sole of the foot. Sciatica is a symptom of another medical condition that pinches or puts pressure on the sciatic nerve. Sciatica most often only affects one side of the body. Sciatica usually goes away on its own or with treatment. In some cases, sciatica may come back (recur). What are the causes? This condition is caused by pressure on the sciatic nerve or pinching of the nerve. This may be the result of: A disk in between the bones of the spine bulging out too far (herniated disk). Age-related changes in the spinal disks. A pain disorder that affects a muscle in the buttock. Extra bone growth near the sciatic nerve. A break (fracture) of the pelvis. Pregnancy. Tumor. This is rare. What increases the risk? The following factors may make you more likely to develop this condition: Playing sports that place pressure or stress on the spine. Having poor strength and flexibility. A history of back injury or surgery. Sitting for long periods of time. Doing activities that involve repetitive bending or lifting. Obesity. What are the signs or symptoms? Symptoms can vary from mild to very severe. They may include: Any of the following problems in the lower back, leg, hip, or buttock: Mild tingling, numbness, or dull aches. Burning sensations. Sharp pains. Numbness in the back of the calf or the sole of the foot. Leg weakness. Severe back pain that makes movement difficult. Symptoms may get worse when you cough, sneeze, or laugh, or when you sit or stand for long periods of time. How is this diagnosed? This condition may be diagnosed based on: Your symptoms  and medical history. A physical exam. Blood tests. Imaging tests, such as: X-rays. An MRI. A CT scan. How is this treated? In many cases, this condition improves on its own without treatment. However, treatment may include: Reducing or modifying physical activity. Exercising, including strengthening and stretching. Icing and applying heat to the affected area. Medicines that help to: Relieve pain and swelling. Relax your muscles. Injections of medicines that help to relieve pain and inflammation (steroids) around the sciatic nerve. Surgery. Follow these instructions at home: Medicines Take over-the-counter and prescription medicines only as told by your health care provider. Ask your health care provider if the medicine prescribed to you requires you to avoid driving or using heavy machinery. Managing pain     If directed, put ice on the affected area. To do this: Put ice in a plastic bag. Place a towel between your skin and the bag. Leave the ice on for 20 minutes, 2-3 times a day. If your skin turns bright red, remove the ice right away to prevent skin damage. The risk of skin damage is higher if you cannot feel pain, heat, or cold. If directed, apply heat to the affected area as often as told by your health care provider. Use the heat source that your health care provider recommends, such as a moist heat pack or a heating pad. Place a towel between your skin and the heat source. Leave the heat on for 20-30 minutes. If your skin turns bright red, remove the heat right away to prevent G[place]chall. The   risk of Jansma is higher if you cannot feel pain, heat, or cold. Activity  Return to your normal activities as told by your health care provider. Ask your health care provider what activities are safe for you. Avoid activities that make your symptoms worse. Take brief periods of rest throughout the day. When you rest for longer periods, mix in some mild activity or stretching between  periods of rest. This will help to prevent stiffness and pain. Avoid sitting for long periods of time without moving. Get up and move around at least one time each hour. Exercise and stretch regularly as told by your health care provider. Do not lift anything that is heavier than 10 lb (4.5 kg) until your health care provider says that it is safe. When you do not have symptoms, you should still avoid heavy lifting, especially repetitive heavy lifting. When you lift objects, always use proper lifting technique, which includes: Bending your knees. Keeping the load close to your body. Avoiding twisting. General instructions Maintain a healthy weight. Excess weight puts extra stress on your back. Wear supportive, comfortable shoes. Avoid wearing high heels. Avoid sleeping on a mattress that is too soft or too hard. A mattress that is firm enough to support your back when you sleep may help to reduce your pain. Contact a health care provider if: Your pain is not controlled by medicine. Your pain does not improve or gets worse. Your pain lasts longer than 4 weeks. You have unexplained weight loss. Get help right away if: You are not able to control when you urinate or have bowel movements (incontinence). You have: Weakness in your lower back, pelvis, buttocks, or legs that gets worse. Redness or swelling of your back. A burning sensation when you urinate. Summary Sciatica is pain, numbness, weakness, or tingling along the path of the sciatic nerve, which may include the lower back, legs, hips, and buttocks. This condition is caused by pressure on the sciatic nerve or pinching of the nerve. Treatment often includes rest, exercise, medicines, and applying ice or heat. This information is not intended to replace advice given to you by your health care provider. Make sure you discuss any questions you have with your health care provider. Document Revised: 06/06/2021 Document Reviewed:  06/06/2021 Elsevier Patient Education  2024 [employer]  

## 2023-01-23 ENCOUNTER — Telehealth: Payer: Self-pay | Admitting: Family Medicine

## 2023-01-23 ENCOUNTER — Other Ambulatory Visit (HOSPITAL_COMMUNITY): Payer: Self-pay

## 2023-01-23 ENCOUNTER — Encounter: Payer: Self-pay | Admitting: Family Medicine

## 2023-01-23 DIAGNOSIS — M5431 Sciatica, right side: Secondary | ICD-10-CM

## 2023-01-23 MED ORDER — TRAMADOL HCL 50 MG PO TABS
50.0000 mg | ORAL_TABLET | Freq: Three times a day (TID) | ORAL | 0 refills | Status: AC | PRN
  Filled 2023-01-23 – 2023-01-24 (×2): qty 15, 5d supply, fill #0

## 2023-01-23 NOTE — Telephone Encounter (Signed)
Pt called to follow up on myChart message she sent today. She needs a response back or a call back from the nurse at  [phone]

## 2023-01-24 ENCOUNTER — Other Ambulatory Visit (HOSPITAL_COMMUNITY): Payer: Self-pay

## 2023-01-24 ENCOUNTER — Other Ambulatory Visit: Payer: Self-pay | Admitting: Family Medicine

## 2023-01-24 ENCOUNTER — Other Ambulatory Visit: Payer: Self-pay | Admitting: Registered Nurse

## 2023-01-24 DIAGNOSIS — M791 Myalgia, unspecified site: Secondary | ICD-10-CM

## 2023-01-24 DIAGNOSIS — E7800 Pure hypercholesterolemia, unspecified: Secondary | ICD-10-CM

## 2023-01-24 MED ORDER — METHOCARBAMOL 500 MG PO TABS
500.0000 mg | ORAL_TABLET | Freq: Four times a day (QID) | ORAL | 1 refills | Status: AC
  Filled 2023-01-24: qty 60, 15d supply, fill #0
  Filled 2023-12-10: qty 60, 15d supply, fill #1

## 2023-01-24 NOTE — Telephone Encounter (Signed)
Provider responded to the my chart message. Dm/cma

## 2023-01-25 ENCOUNTER — Other Ambulatory Visit: Payer: Self-pay

## 2023-01-25 ENCOUNTER — Other Ambulatory Visit (HOSPITAL_COMMUNITY): Payer: Self-pay

## 2023-01-25 MED ORDER — MEDROXYPROGESTERONE ACETATE 5 MG PO TABS
5.0000 mg | ORAL_TABLET | Freq: Every day | ORAL | 0 refills | Status: DC
  Filled 2023-01-25 – 2023-02-13 (×2): qty 30, 30d supply, fill #0

## 2023-01-26 ENCOUNTER — Other Ambulatory Visit (HOSPITAL_COMMUNITY): Payer: Self-pay

## 2023-01-26 ENCOUNTER — Other Ambulatory Visit: Payer: Self-pay

## 2023-01-26 DIAGNOSIS — G4733 Obstructive sleep apnea (adult) (pediatric): Secondary | ICD-10-CM | POA: Diagnosis not present

## 2023-01-31 ENCOUNTER — Other Ambulatory Visit (HOSPITAL_COMMUNITY): Payer: Self-pay

## 2023-02-07 DIAGNOSIS — G4733 Obstructive sleep apnea (adult) (pediatric): Secondary | ICD-10-CM | POA: Diagnosis not present

## 2023-02-13 ENCOUNTER — Other Ambulatory Visit (HOSPITAL_COMMUNITY): Payer: Self-pay

## 2023-02-13 ENCOUNTER — Other Ambulatory Visit: Payer: Self-pay | Admitting: Registered Nurse

## 2023-02-13 ENCOUNTER — Other Ambulatory Visit: Payer: Self-pay

## 2023-02-13 DIAGNOSIS — E7800 Pure hypercholesterolemia, unspecified: Secondary | ICD-10-CM

## 2023-02-17 ENCOUNTER — Telehealth: Payer: 59 | Admitting: Family Medicine

## 2023-02-17 ENCOUNTER — Other Ambulatory Visit (HOSPITAL_COMMUNITY): Payer: Self-pay

## 2023-02-17 DIAGNOSIS — J019 Acute sinusitis, unspecified: Secondary | ICD-10-CM | POA: Diagnosis not present

## 2023-02-17 DIAGNOSIS — B9689 Other specified bacterial agents as the cause of diseases classified elsewhere: Secondary | ICD-10-CM | POA: Diagnosis not present

## 2023-02-17 DIAGNOSIS — R051 Acute cough: Secondary | ICD-10-CM

## 2023-02-17 MED ORDER — AMOXICILLIN-POT CLAVULANATE 875-125 MG PO TABS
1.0000 | ORAL_TABLET | Freq: Two times a day (BID) | ORAL | 0 refills | Status: DC
  Filled 2023-02-17: qty 20, 10d supply, fill #0

## 2023-02-17 MED ORDER — BENZONATATE 200 MG PO CAPS
200.0000 mg | ORAL_CAPSULE | Freq: Three times a day (TID) | ORAL | 0 refills | Status: AC | PRN
  Filled 2023-02-17: qty 30, 10d supply, fill #0

## 2023-02-17 NOTE — Progress Notes (Signed)
Virtual Visit Consent   [name], you are scheduled for a virtual visit with a [facility] provider today. Just as with appointments in the office, your consent must be obtained to participate. Your consent will be active for this visit and any virtual visit you may have with one of our providers in the next 365 days. If you have a MyChart account, a copy of this consent can be sent to you electronically.  As this is a virtual visit, video technology does not allow for your provider to perform a traditional examination. This may limit your provider's ability to fully assess your condition. If your provider identifies any concerns that need to be evaluated in person or the need to arrange testing (such as labs, EKG, etc.), we will make arrangements to do so. Although advances in technology are sophisticated, we cannot ensure that it will always work on either your end or our end. If the connection with a video visit is poor, the visit may have to be switched to a telephone visit. With either a video or telephone visit, we are not always able to ensure that we have a secure connection.  By engaging in this virtual visit, you consent to the provision of healthcare and authorize for your insurance to be billed (if applicable) for the services provided during this visit. Depending on your insurance coverage, you may receive a charge related to this service.  I need to obtain your verbal consent now. Are you willing to proceed with your visit today? SILVA SCHOPP has provided verbal consent on 02/17/2023 for a virtual visit (video or telephone). Georgana Curio, FNP  Date: 02/17/2023 10:29 AM  Virtual Visit via Video Note   I, Georgana Curio, connected with  Diana Garrison  ([id number], Jul 13, 1970) on 02/17/23 at 10:30 AM EST by a video-enabled telemedicine application and verified that I am speaking with the correct person using two identifiers.  Location: Patient: Virtual Visit Location  Patient: Home Provider: Virtual Visit Location Provider: Home Office   I discussed the limitations of evaluation and management by telemedicine and the availability of in person appointments. The patient expressed understanding and agreed to proceed.    History of Present Illness: Diana Garrison is a 52 y.o. who identifies as a female who was assigned female at birth, and is being seen today for cough, sinus pressure and pain, post nasal drainage with yellow green mucus, no fever, chills or body aches. Sx worsening after almost a week. Neg in home covid test. .  HPI: HPI  Problems:  Patient Active Problem List   Diagnosis Date Noted   Thickened endometrium 12/14/2022   Postmenopausal vaginal bleeding 11/21/2022   Positional headache 11/03/2022   Decreased libido 09/20/2022   OSA on CPAP 06/27/2022   Polyphagia 05/16/2022   Morbid obesity (HCC) with starting BMI 54 01/03/2022   Pre-diabetes 12/05/2021   Other hyperlipidemia 11/01/2021   Depression 11/01/2021   Seasonal allergic rhinitis 10/26/2021   Hyperlipidemia 10/26/2021   Vitamin D insufficiency 10/20/2021   Moderate episode of recurrent major depressive disorder (HCC) 08/16/2021   Right knee meniscal tear 08/16/2021   Spider veins of both lower extremities 10/17/2016   Tobacco use disorder 06/06/2013    Allergies:  Allergies  Allergen Reactions   Percocet [Oxycodone-Acetaminophen] Other (See Comments)    Hallucinations, paranoia   Medications:  Current Outpatient Medications:    diclofenac (VOLTAREN) 75 MG EC tablet, Take 1 tablet (75 mg total) by mouth 2 (two)  times daily as needed for pain., Disp: 60 tablet, Rfl: 0   magnesium 30 MG tablet, Take 1 tablet (30 mg total) by mouth 2 (two) times daily. (Patient taking differently: Take 30 mg by mouth once.), Disp: 60 tablet, Rfl: 0   medroxyPROGESTERone (PROVERA) 5 MG tablet, Take 1 tablet (5 mg total) by mouth daily., Disp: 30 tablet, Rfl: 0   meloxicam (MOBIC) 15 MG  tablet, Take 1 tablet (15 mg total) by mouth daily., Disp: 90 tablet, Rfl: 1   methocarbamol (ROBAXIN) 500 MG tablet, Take 1 tablet (500 mg total) by mouth 4 (four) times daily., Disp: 60 tablet, Rfl: 1   Multiple Vitamin (MULTIVITAMIN) tablet, Take 1 tablet by mouth daily., Disp: , Rfl:    predniSONE (DELTASONE) 20 MG tablet, Take 2 tablets (40 mg total) by mouth daily with breakfast., Disp: 7 tablet, Rfl: 0   rosuvastatin (CRESTOR) 20 MG tablet, Take 1 tablet by mouth daily., Disp: 90 tablet, Rfl: 3   topiramate (TOPAMAX) 25 MG tablet, Take 3 tablets (75 mg total) by mouth daily., Disp: 90 tablet, Rfl: 5   zinc gluconate 50 MG tablet, Take 1 tablet (50 mg total) by mouth daily., Disp: 30 tablet, Rfl: 0  Observations/Objective: Patient is well-developed, well-nourished in no acute distress.  Resting comfortably  at home.  Head is normocephalic, atraumatic.  No labored breathing.  Speech is clear and coherent with logical content.  Patient is alert and oriented at baseline.    Assessment and Plan: 1. Acute bacterial sinusitis  2. Acute cough  Increase fluids, humidifier at night, uc if sx worsen.   Follow Up Instructions: I discussed the assessment and treatment plan with the patient. The patient was provided an opportunity to ask questions and all were answered. The patient agreed with the plan and demonstrated an understanding of the instructions.  A copy of instructions were sent to the patient via MyChart unless otherwise noted below.     The patient was advised to call back or seek an in-person evaluation if the symptoms worsen or if the condition fails to improve as anticipated.    Georgana Curio, FNP

## 2023-02-17 NOTE — Patient Instructions (Signed)

## 2023-02-25 DIAGNOSIS — G4733 Obstructive sleep apnea (adult) (pediatric): Secondary | ICD-10-CM | POA: Diagnosis not present

## 2023-03-23 ENCOUNTER — Ambulatory Visit (HOSPITAL_BASED_OUTPATIENT_CLINIC_OR_DEPARTMENT_OTHER): Admit: 2023-03-23 | Payer: 59 | Admitting: Obstetrics and Gynecology

## 2023-03-23 ENCOUNTER — Encounter (HOSPITAL_BASED_OUTPATIENT_CLINIC_OR_DEPARTMENT_OTHER): Payer: Self-pay

## 2023-03-23 ENCOUNTER — Ambulatory Visit: Payer: 59 | Admitting: Family Medicine

## 2023-03-23 SURGERY — DILATATION AND CURETTAGE /HYSTEROSCOPY
Anesthesia: Choice

## 2023-03-26 ENCOUNTER — Encounter: Payer: Self-pay | Admitting: Family Medicine

## 2023-03-26 DIAGNOSIS — E7800 Pure hypercholesterolemia, unspecified: Secondary | ICD-10-CM

## 2023-03-26 DIAGNOSIS — M791 Myalgia, unspecified site: Secondary | ICD-10-CM

## 2023-03-26 MED ORDER — CYCLOBENZAPRINE HCL 10 MG PO TABS
10.0000 mg | ORAL_TABLET | Freq: Three times a day (TID) | ORAL | 1 refills | Status: DC | PRN
  Filled 2023-03-26: qty 30, 10d supply, fill #0

## 2023-03-26 MED ORDER — ROSUVASTATIN CALCIUM 20 MG PO TABS
20.0000 mg | ORAL_TABLET | Freq: Every day | ORAL | 3 refills | Status: AC
  Filled 2023-03-26: qty 90, 90d supply, fill #0
  Filled 2023-05-15 – 2023-09-13 (×2): qty 90, 90d supply, fill #1
  Filled 2024-01-14: qty 90, 90d supply, fill #2

## 2023-03-27 ENCOUNTER — Other Ambulatory Visit (HOSPITAL_COMMUNITY): Payer: Self-pay

## 2023-03-27 ENCOUNTER — Other Ambulatory Visit: Payer: Self-pay

## 2023-03-28 ENCOUNTER — Other Ambulatory Visit: Payer: Self-pay

## 2023-03-28 ENCOUNTER — Other Ambulatory Visit (HOSPITAL_COMMUNITY): Payer: Self-pay

## 2023-03-28 DIAGNOSIS — G4733 Obstructive sleep apnea (adult) (pediatric): Secondary | ICD-10-CM | POA: Diagnosis not present

## 2023-03-28 MED ORDER — MEDROXYPROGESTERONE ACETATE 5 MG PO TABS
5.0000 mg | ORAL_TABLET | Freq: Every day | ORAL | 1 refills | Status: DC
  Filled 2023-03-28: qty 30, 30d supply, fill #0
  Filled 2023-05-15: qty 30, 30d supply, fill #1

## 2023-03-28 NOTE — Progress Notes (Deleted)
 NEUROLOGY FOLLOW UP OFFICE NOTE  [name] [id number]  Subjective:  Diana Garrison is a 53 y.o. year old right-handed female with a medical history of OSA (not currently on CPAP), obesity, pre-DM, HLD, vit D deficiency, depression who we last saw on 12/20/22 for nocturnal headaches.  To briefly review: Patient's headaches occur only at night. When she sits up, she thinks it slowly improves. She wakes up from sleep. She does not think she has any visual symptoms. Patient is taking ibuprofen, alleve, tylenol. She is taking it every 2 hours during the night. Symptoms typically last 30 minutes and is typically is located on the front of her head. She rates the pain as 10/10. It feels like an intense pressure, like someone is stepping on her head. She feels like blood is pulsating. She denies photophobia, phonophobia, nausea, vomiting. She has the headache 2-3 times per week. She does not have symptoms during the day. She denies any significant neck pain.   About 20 years ago she had headaches and double vision. She saw a neurologist and had a lumbar puncture with high volume tap. After this, her visual issue was rapidly corrected. Due to concern for IIH, patient was sent to ophtho. Patient was seen at [facility] on 12/12/22 and had dilated fundoscopy that reports normal optic disc bilaterally (no papilledema).   Patient has OSA and is supposed to be on CPAP. She has not been using it for the last few months though. She only feels tired in the morning when she has had a headache during the night. She rarely naps, only on off days, but does not get headaches at that time.   She is currently on robaxin and mobic for knee pain.    Birth control? Patient takes progesterone for heavy bleeding/pre-menopause Smoker? 6 cigarettes per day EtOH use: 2 glasses of wine per week Restrictive diet: No. She denies any recent weight changes Caffeine use: None currently  Most recent Assessment and Plan  (12/20/22): Diana Garrison is a 53 y.o. female who presents for evaluation of nocturnal headaches. She has a relevant medical history of OSA (not currently on CPAP), obesity, pre-DM, HLD, vit D deficiency, depression. Her neurological examination is essentially normal today, including non-dilated fundoscopy. Given her history of prior headaches and vision changes that responded well to lumbar puncture and the positional nature of her headaches, idiopathic intracranial hypertension is the most likely etiology of her headaches. We discussed repeating LP, but patient was not interested given the pain she experienced previously. She would rather empirically treat. She also has OSA and was unable to tolerate CPAP. This could also be contributing to headaches.   PLAN: -Discussed LP with opening pressure, but patient would prefer to treat empirically for IIH -Discussed Diamox and Topamax. Patient preferred to try Topamax. Will start 25 mg daily for 1 week, then increase 50 mg thereafter. -Consider sleeping more with head elevated -Patient to reach out to her sleep medicine doctor to discuss other options for OSA as she has been unable to tolerate CPAP  Since their last visit: Patient's headaches had not improved, so Topamax was increased to 75 mg daily on 01/16/23.***  Sleep medicine?***  MEDICATIONS:  Outpatient Encounter Medications as of 04/05/2023  Medication Sig   amoxicillin-clavulanate (AUGMENTIN) 875-125 MG tablet Take 1 tablet by mouth 2 (two) times daily.   cyclobenzaprine (FLEXERIL) 10 MG tablet Take 1 tablet (10 mg total) by mouth 3 (three) times daily as needed for muscle spasms.  diclofenac (VOLTAREN) 75 MG EC tablet Take 1 tablet (75 mg total) by mouth 2 (two) times daily as needed for pain.   magnesium 30 MG tablet Take 1 tablet (30 mg total) by mouth 2 (two) times daily. (Patient taking differently: Take 30 mg by mouth once.)   medroxyPROGESTERone (PROVERA) 5 MG tablet Take 1 tablet  (5 mg total) by mouth daily.   meloxicam (MOBIC) 15 MG tablet Take 1 tablet (15 mg total) by mouth daily.   methocarbamol (ROBAXIN) 500 MG tablet Take 1 tablet (500 mg total) by mouth 4 (four) times daily.   Multiple Vitamin (MULTIVITAMIN) tablet Take 1 tablet by mouth daily.   predniSONE (DELTASONE) 20 MG tablet Take 2 tablets (40 mg total) by mouth daily with breakfast.   rosuvastatin (CRESTOR) 20 MG tablet Take 1 tablet (20 mg total) by mouth daily.   topiramate (TOPAMAX) 25 MG tablet Take 3 tablets (75 mg total) by mouth daily.   zinc gluconate 50 MG tablet Take 1 tablet (50 mg total) by mouth daily.   No facility-administered encounter medications on file as of 04/05/2023.    PAST MEDICAL HISTORY: Past Medical History:  Diagnosis Date   Hyperlipidemia    [employer] 11/12/2019    PAST SURGICAL HISTORY: Past Surgical History:  Procedure Laterality Date   CESAREAN SECTION N/A    [employer] 11/12/2019   GANGLION CYST EXCISION Bilateral    x2    ALLERGIES: Allergies  Allergen Reactions   Percocet [Oxycodone-Acetaminophen] Other (See Comments)    Hallucinations, paranoia    FAMILY HISTORY: Family History  Problem Relation Age of Onset   Obesity Father    Cirrhosis Father    Pulmonary embolism Sister    Lupus Brother    Breast cancer Neg Hx     SOCIAL HISTORY: Social History   Tobacco Use   Smoking status: Every Day    Current packs/day: 0.25    Average packs/day: 0.3 packs/day for 15.5 years (3.9 ttl pk-yrs)    Types: Cigarettes    Start date: 10/10/2007    Passive exposure: Never   Smokeless tobacco: Never   Tobacco comments:    6 cigs per day 04/14/21.   Vaping Use   Vaping status: Never Used  Substance Use Topics   Alcohol use: Yes    Comment: 2 drinks per week.   Drug use: Not Currently   Social History   Social History Narrative   Are you right handed or left handed? Right   Are you currently employed ?    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   Who lives with you? husband   What type of home do you live in: 1 story or 2 story? one   Caffeine 1 time a week        Objective:  Vital Signs:  There were no vitals taken for this visit.  ***  Labs and Imaging review: No new results***  Previously reviewed results: Lab Results  Component Value Date    HGBA1C 6.1 (H) 09/04/2022      Recent Labs       Lab Results  Component Value Date    VITAMINB12 1,000 10/18/2021      Recent Labs[] Expand by Default       Lab Results  Component Value Date    TSH 1.04 11/21/2022      11/21/22: CMP significant for glucose of 119 CBC w/ diff significant for MCV  of 96.9   Vit D (11/03/22): 31 (wnl)   Ferritin (10/18/21): 34   Imaging: MRI lumbar spine wo contrast (08/11/2013): FINDINGS:  Last fully open disk space is labeled L5-S1. Present examination  incorporates from T11-12 disc space through the lower sacrum.   Conus just below the T12-L1 disc space.   Right ovary 2.4 x 1.8 cm cyst/follicle may be present although  incompletely assessed.   T11-12 through L1-2 unremarkable.   L2-3:  Minimal facet joint degenerative changes.   L3-4: Minimal right-sided and mild left-sided facet joint  degenerative changes.   L4-5:  Minimal facet joint degenerative changes.   L5-S1:  Minimal facet joint degenerative changes.   Decreased signal intensity of bone marrow may be related to  patient's habitus. Correlation with CBC to exclude anemia  contributing to this appearance may be considered   IMPRESSION:  No evidence of lumbar disc herniation, spinal stenosis or foraminal  narrowing.   Minimal to mild facet joint degenerative changes most notable on the  left at the L3-4 level.   Decreased signal intensity of bone marrow may be related to  patient's habitus. Correlation with CBC to exclude anemia  contributing to this appearance may be considered.    EMG (04/08/19): NCV & EMG  Findings: 2 electrodiagnostic testing of the left upper extremity shows:  Left median sensory response shows prolonged latency (6.5 ms) and reduced amplitude (7.4 V). Left median motor response shows prolonged latency (5.9 ms).  Left ulnar motor responses within normal limits. There is no evidence of active or chronic motor axonal loss changes affecting any of the tested muscles.  Motor unit configuration and recruitment pattern is within normal limits.   Impression: Left median neuropathy at or distal to the wrist (moderate-to-severe) consistent with a clinical diagnosis of carpal tunnel syndrome   Sleep study result Date of study: 05/11/2021  Impression: Moderately severe obstructive sleep apnea Moderate oxygen desaturations  Recommendation: DME referral  Recommend CPAP therapy for moderate obstructive sleep apnea  Auto titrating CPAP with pressure settings of 5-20 will be appropriate  Encourage weight loss measures   Assessment/Plan:  This is Donnie Coffin, a 53 y.o. female with: ***   Plan: ***  Return to clinic in ***  Total time spent reviewing records, interview, history/exam, documentation, and coordination of care on day of encounter:  *** min  Jacquelyne Balint, MD

## 2023-03-30 ENCOUNTER — Encounter: Payer: Self-pay | Admitting: Family Medicine

## 2023-04-05 ENCOUNTER — Ambulatory Visit: Payer: 59 | Admitting: Neurology

## 2023-05-15 ENCOUNTER — Other Ambulatory Visit: Payer: Self-pay

## 2023-05-15 ENCOUNTER — Other Ambulatory Visit (HOSPITAL_COMMUNITY): Payer: Self-pay

## 2023-05-15 DIAGNOSIS — M1711 Unilateral primary osteoarthritis, right knee: Secondary | ICD-10-CM | POA: Diagnosis not present

## 2023-05-15 DIAGNOSIS — M79644 Pain in right finger(s): Secondary | ICD-10-CM | POA: Diagnosis not present

## 2023-05-15 MED ORDER — DICLOFENAC SODIUM 75 MG PO TBEC
75.0000 mg | DELAYED_RELEASE_TABLET | Freq: Two times a day (BID) | ORAL | 0 refills | Status: DC | PRN
  Filled 2023-05-15: qty 60, 30d supply, fill #0

## 2023-07-19 ENCOUNTER — Other Ambulatory Visit (HOSPITAL_COMMUNITY): Payer: Self-pay

## 2023-07-19 ENCOUNTER — Other Ambulatory Visit: Payer: Self-pay

## 2023-07-19 MED ORDER — DICLOFENAC SODIUM 75 MG PO TBEC
75.0000 mg | DELAYED_RELEASE_TABLET | Freq: Two times a day (BID) | ORAL | 0 refills | Status: DC | PRN
  Filled 2023-07-19: qty 60, 30d supply, fill #0

## 2023-07-19 MED ORDER — MEDROXYPROGESTERONE ACETATE 5 MG PO TABS
5.0000 mg | ORAL_TABLET | Freq: Every day | ORAL | 0 refills | Status: DC
  Filled 2023-07-19: qty 30, 30d supply, fill #0

## 2023-07-31 ENCOUNTER — Encounter (HOSPITAL_BASED_OUTPATIENT_CLINIC_OR_DEPARTMENT_OTHER): Payer: Self-pay

## 2023-07-31 ENCOUNTER — Other Ambulatory Visit (HOSPITAL_COMMUNITY): Payer: Self-pay

## 2023-07-31 ENCOUNTER — Other Ambulatory Visit: Payer: Self-pay

## 2023-07-31 ENCOUNTER — Emergency Department (HOSPITAL_BASED_OUTPATIENT_CLINIC_OR_DEPARTMENT_OTHER): Admission: EM | Admit: 2023-07-31 | Discharge: 2023-07-31 | Disposition: A | Discharge status: Home / Self Care

## 2023-07-31 ENCOUNTER — Emergency Department (HOSPITAL_BASED_OUTPATIENT_CLINIC_OR_DEPARTMENT_OTHER)

## 2023-07-31 DIAGNOSIS — M48061 Spinal stenosis, lumbar region without neurogenic claudication: Secondary | ICD-10-CM | POA: Diagnosis not present

## 2023-07-31 DIAGNOSIS — M5116 Intervertebral disc disorders with radiculopathy, lumbar region: Secondary | ICD-10-CM | POA: Diagnosis not present

## 2023-07-31 DIAGNOSIS — M5416 Radiculopathy, lumbar region: Secondary | ICD-10-CM | POA: Diagnosis not present

## 2023-07-31 DIAGNOSIS — M545 Low back pain, unspecified: Secondary | ICD-10-CM | POA: Diagnosis present

## 2023-07-31 MED ORDER — DEXAMETHASONE SODIUM PHOSPHATE 10 MG/ML IJ SOLN
10.0000 mg | Freq: Once | INTRAMUSCULAR | Status: AC
  Administered 2023-07-31: 10 mg via INTRAMUSCULAR
  Filled 2023-07-31: qty 1

## 2023-07-31 MED ORDER — TRAMADOL HCL 50 MG PO TABS
50.0000 mg | ORAL_TABLET | Freq: Four times a day (QID) | ORAL | 0 refills | Status: DC | PRN
  Filled 2023-07-31: qty 15, 4d supply, fill #0

## 2023-07-31 MED ORDER — TRAMADOL HCL 50 MG PO TABS
50.0000 mg | ORAL_TABLET | Freq: Once | ORAL | Status: AC
  Administered 2023-07-31: 50 mg via ORAL
  Filled 2023-07-31: qty 1

## 2023-07-31 MED ORDER — METHYLPREDNISOLONE 4 MG PO TBPK
ORAL_TABLET | ORAL | 0 refills | Status: DC
  Filled 2023-07-31: qty 21, 6d supply, fill #0

## 2023-07-31 NOTE — ED Triage Notes (Signed)
 Pt reports she flew on Tuesday and seats were small. Also reports went to play and sat in small seats  Left leg pain from mid thigh to hip. Pain has become severe. Pt has been using Robaxin  and meloxicam 

## 2023-07-31 NOTE — Discharge Instructions (Signed)
 Your x-ray did not show any bony problems but does show that you likely have some disc disease.  Please take the Medrol  Dosepak as prescribed.  Continue your medications you are taking at home.  Take the tramadol  as needed for breakthrough pain.  Do not drive or drink alcohol taking the tramadol  as it may make you drowsy.  Follow-up with your doctor for reevaluation.  Return to the emergency department for worsening symptoms.

## 2023-07-31 NOTE — ED Notes (Signed)
 ED Provider at bedside.

## 2023-07-31 NOTE — ED Notes (Signed)
 Patient transported to X-ray

## 2023-07-31 NOTE — ED Provider Notes (Signed)
 [place] [facility] Provider Note   CSN: [id number] Arrival date & time: 07/31/23  0704     History  Chief Complaint  Patient presents with   Leg Pain    Diana Garrison is a 53 y.o. female.  53 year old female with past medical history of hyperlipidemia presenting to the emergency department today with left-sided back pain.  She states that the pain rates down her left leg to her knee.  She denies any calf pain or tenderness.  States that this started after she had to sit on a flight in a small plane.  She states that she also went to the [facility] [place] 2 later and was sitting in the chair which was uncomfortable.  She states that she is having more pain.  Denies any weakness, numbness, or tingling.  She denies any bowel or bladder dysfunction and has not had any saddle anesthesia.  She came to the ER today for further evaluation regarding this due to ongoing symptoms.   Leg Pain      Home Medications Prior to Admission medications   Medication Sig Start Date End Date Taking? Authorizing Provider  methylPREDNISolone  (MEDROL  DOSEPAK) 4 MG TBPK tablet Take as directed on Dosepak 07/31/23  Yes Carin Charleston, MD  traMADol  (ULTRAM ) 50 MG tablet Take 1 tablet (50 mg total) by mouth every 6 (six) hours as needed. 07/31/23  Yes Carin Charleston, MD  amoxicillin -clavulanate (AUGMENTIN ) 875-125 MG tablet Take 1 tablet by mouth 2 (two) times daily. 02/17/23   Blair, Diane W, FNP  cyclobenzaprine  (FLEXERIL ) 10 MG tablet Take 1 tablet (10 mg total) by mouth 3 (three) times daily as needed for muscle spasms. 03/26/23   Graig Lawyer, MD  diclofenac  (VOLTAREN ) 75 MG EC tablet Take 1 tablet (75 mg total) by mouth 2 (two) times daily as needed for pain. 07/10/22     diclofenac  (VOLTAREN ) 75 MG EC tablet Take 1 tablet (75 mg total) by mouth 2 (two) times daily as needed for pain. 07/19/23     magnesium  30 MG tablet Take 1 tablet (30 mg total) by mouth 2 (two)  times daily. Patient taking differently: Take 30 mg by mouth once. 11/07/21   Graig Lawyer, MD  medroxyPROGESTERone  (PROVERA ) 5 MG tablet Take 1 tablet (5 mg total) by mouth daily. 07/19/23     meloxicam  (MOBIC ) 15 MG tablet Take 1 tablet (15 mg total) by mouth daily. 01/04/23   Graig Lawyer, MD  methocarbamol  (ROBAXIN ) 500 MG tablet Take 1 tablet (500 mg total) by mouth 4 (four) times daily. 01/24/23   Graig Lawyer, MD  Multiple Vitamin (MULTIVITAMIN) tablet Take 1 tablet by mouth daily.    [provider]  rosuvastatin  (CRESTOR ) 20 MG tablet Take 1 tablet (20 mg total) by mouth daily. 03/26/23   Graig Lawyer, MD  topiramate  (TOPAMAX ) 25 MG tablet Take 3 tablets (75 mg total) by mouth daily. 01/16/23   Ellene Gustin, MD  zinc  gluconate 50 MG tablet Take 1 tablet (50 mg total) by mouth daily. 11/07/21   Graig Lawyer, MD      Allergies    Percocet [oxycodone -acetaminophen ]    Review of Systems   Review of Systems  All other systems reviewed and are negative.   Physical Exam Updated Vital Signs BP (!) 161/96   Pulse 76   Temp 98 F (36.7 C)   Resp 18   Wt (!) 161 kg  SpO2 100%   BMI 56.44 kg/m  Physical Exam Vitals and nursing note reviewed.   Gen: NAD Eyes: PERRL, EOMI HEENT: no oropharyngeal swelling Neck: trachea midline Resp: clear to auscultation bilaterally Card: RRR, no murmurs, rubs, or gallops Abd: nontender, nondistended Extremities: no calf tenderness, no edema MSK: Tender over the mid lumbar spine with left-sided paraspinal tenderness noted, positive straight leg raise on the left Neuro: Equal strength and sensation throughout the bilateral lower extremities with normal patellar and Achilles reflexes bilaterally Vascular: 2+ radial pulses bilaterally, 2+ DP pulses bilaterally Skin: no rashes Psyc: acting appropriately   ED Results / Procedures / Treatments   Labs (all labs ordered are listed, but only abnormal results are  displayed) Labs Reviewed - No data to display  EKG None  Radiology DG Lumbar Spine Complete Result Date: 07/31/2023 CLINICAL DATA:  Lumbar radiculopathy EXAM: LUMBAR SPINE - COMPLETE 4+ VIEW COMPARISON:  None Available. FINDINGS: No fracture or dislocation. Normal alignment. Mild narrowing of interspaces L2-S1 with small anterior endplate spurs. IMPRESSION: 1. No acute findings. 2. Mild multilevel degenerative disc disease. Electronically Signed   By: Nicoletta Barrier M.D.   On: 07/31/2023 08:24    Procedures Procedures    Medications Ordered in ED Medications  dexamethasone (DECADRON) injection 10 mg (10 mg Intramuscular Given 07/31/23 0754)    ED Course/ Medical Decision Making/ A&P                                 Medical Decision Making 53 year old female presenting to the emergency department today with low back pain and radicular symptoms.  Will further evaluate the patient here with an x-ray to evaluate for bony abnormalities.  This is consistent with lumbar radiculopathy.  States she has had issues with the right side in the past but never the left.  Denies any red flag symptoms for cauda equina syndrome or cord compressing lesion and exam is reassuring.  She denies any IV drug abuse or fevers so suspicion for epidural abscess or hematoma is low at this time.  She states that she has improved in the past with steroids.  Will give her Decadron here.  Will send her home on steroids if imaging is unremarkable.  Neuroexam is reassuring.  The patient's x-rays do not show any bony abnormalities but does have some findings consistent with likely degenerative disc disease.  She is discharged with return precautions.  Amount and/or Complexity of Data Reviewed Radiology: ordered.  Risk Prescription drug management.           Final Clinical Impression(s) / ED Diagnoses Final diagnoses:  Lumbar radiculopathy    Rx / DC Orders ED Discharge Orders          Ordered     methylPREDNISolone  (MEDROL  DOSEPAK) 4 MG TBPK tablet        07/31/23 0828    traMADol  (ULTRAM ) 50 MG tablet  Every 6 hours PRN        07/31/23 0828              Carin Charleston, MD 07/31/23 0830

## 2023-08-01 ENCOUNTER — Other Ambulatory Visit (HOSPITAL_COMMUNITY): Payer: Self-pay

## 2023-08-01 ENCOUNTER — Encounter: Payer: Self-pay | Admitting: Family Medicine

## 2023-08-01 DIAGNOSIS — M5416 Radiculopathy, lumbar region: Secondary | ICD-10-CM

## 2023-08-01 MED ORDER — LIDOCAINE 5 % EX PTCH
1.0000 | MEDICATED_PATCH | CUTANEOUS | 0 refills | Status: DC
  Filled 2023-08-01 (×2): qty 30, 30d supply, fill #0

## 2023-08-01 NOTE — Telephone Encounter (Signed)
 Please review patient request and advise.  Thanks. Dm/cma

## 2023-08-02 ENCOUNTER — Encounter (HOSPITAL_COMMUNITY): Payer: Self-pay

## 2023-08-02 ENCOUNTER — Ambulatory Visit (HOSPITAL_COMMUNITY)
Admission: EM | Admit: 2023-08-02 | Discharge: 2023-08-02 | Disposition: A | Discharge status: Home / Self Care | Attending: Internal Medicine | Admitting: Internal Medicine

## 2023-08-02 DIAGNOSIS — M5416 Radiculopathy, lumbar region: Secondary | ICD-10-CM

## 2023-08-02 MED ORDER — KETOROLAC TROMETHAMINE 30 MG/ML IJ SOLN
INTRAMUSCULAR | Status: AC
  Filled 2023-08-02: qty 1

## 2023-08-02 MED ORDER — KETOROLAC TROMETHAMINE 30 MG/ML IJ SOLN
30.0000 mg | Freq: Once | INTRAMUSCULAR | Status: AC
  Administered 2023-08-02: 30 mg via INTRAMUSCULAR

## 2023-08-02 NOTE — ED Triage Notes (Signed)
 Patient presenting with left leg pain onset 1 week ago. States she recently flew on a plain and went to a show that had uncomfortable seats. Was seen this past tuesday and given a prednisone  injection. No relief and pain getting worse.  States when this happened before a Toradol  injection helped.  Prescriptions or OTC medications tried: Yes- Prednisone  and Ultram     with no relief

## 2023-08-02 NOTE — ED Provider Notes (Signed)
 [facility]    CSN: [id number] Arrival date & time: 08/02/23  1352      History   Chief Complaint Chief Complaint  Patient presents with   Leg Pain    HPI Diana Garrison is a 53 y.o. female who presents with unresolved L lower back pain radiating to her L thigh. The prednisone  and tramadol  has not helped. In the past Toradol  has helped and would like to try this. The pain is described as constant, sharp and burning. She never had a back MRI. Denies paresthesia, stool or urine incontinence.      Past Medical History:  Diagnosis Date   Hyperlipidemia    Phreesia 11/12/2019    Patient Active Problem List   Diagnosis Date Noted   Thickened endometrium 12/14/2022   Postmenopausal vaginal bleeding 11/21/2022   Positional headache 11/03/2022   Decreased libido 09/20/2022   OSA on CPAP 06/27/2022   Polyphagia 05/16/2022   Morbid obesity (HCC) with starting BMI 54 01/03/2022   Pre-diabetes 12/05/2021   Other hyperlipidemia 11/01/2021   Depression 11/01/2021   Seasonal allergic rhinitis 10/26/2021   Hyperlipidemia 10/26/2021   Vitamin D  insufficiency 10/20/2021   Moderate episode of recurrent major depressive disorder (HCC) 08/16/2021   Right knee meniscal tear 08/16/2021   Spider veins of both lower extremities 10/17/2016   Tobacco use disorder 06/06/2013    Past Surgical History:  Procedure Laterality Date   CESAREAN SECTION N/A    Phreesia 11/12/2019   GANGLION CYST EXCISION Bilateral    x2    OB History     Gravida  3   Para  3   Term  2   Preterm  1   AB  0   Living  2      SAB  0   IAB  0   Ectopic  0   Multiple  0   Live Births  1            Home Medications    Prior to Admission medications   Medication Sig Start Date End Date Taking? Authorizing Provider  cyclobenzaprine  (FLEXERIL ) 10 MG tablet Take 1 tablet (10 mg total) by mouth 3 (three) times daily as needed for muscle spasms. 03/26/23  Yes Graig Lawyer,  MD  diclofenac  (VOLTAREN ) 75 MG EC tablet Take 1 tablet (75 mg total) by mouth 2 (two) times daily as needed for pain. 07/10/22  Yes   diclofenac  (VOLTAREN ) 75 MG EC tablet Take 1 tablet (75 mg total) by mouth 2 (two) times daily as needed for pain. 07/19/23  Yes   lidocaine (LIDODERM) 5 % Place 1 patch onto the skin daily. Remove & Discard patch within 12 hours or as directed by MD 08/01/23  Yes Rudd, Malcolm Scrivener, MD  magnesium  30 MG tablet Take 1 tablet (30 mg total) by mouth 2 (two) times daily. Patient taking differently: Take 30 mg by mouth once. 11/07/21  Yes Graig Lawyer, MD  medroxyPROGESTERone  (PROVERA ) 5 MG tablet Take 1 tablet (5 mg total) by mouth daily. 07/19/23  Yes   meloxicam  (MOBIC ) 15 MG tablet Take 1 tablet (15 mg total) by mouth daily. 01/04/23  Yes Graig Lawyer, MD  methocarbamol  (ROBAXIN ) 500 MG tablet Take 1 tablet (500 mg total) by mouth 4 (four) times daily. 01/24/23  Yes Graig Lawyer, MD  methylPREDNISolone  (MEDROL  DOSEPAK) 4 MG TBPK tablet Take as directed on package 07/31/23  Yes Carin Charleston, MD  Multiple Vitamin (MULTIVITAMIN)  tablet Take 1 tablet by mouth daily.   Yes [provider]  rosuvastatin  (CRESTOR ) 20 MG tablet Take 1 tablet (20 mg total) by mouth daily. 03/26/23  Yes Graig Lawyer, MD  topiramate  (TOPAMAX ) 25 MG tablet Take 3 tablets (75 mg total) by mouth daily. 01/16/23  Yes Ellene Gustin, MD  traMADol  (ULTRAM ) 50 MG tablet Take 1 tablet (50 mg total) by mouth every 6 (six) hours as needed. 07/31/23  Yes Carin Charleston, MD  zinc  gluconate 50 MG tablet Take 1 tablet (50 mg total) by mouth daily. 11/07/21  Yes RuddMalcolm Scrivener, MD    Family History Family History  Problem Relation Age of Onset   Obesity Father    Cirrhosis Father    Pulmonary embolism Sister    Lupus Brother    Breast cancer Neg Hx     Social History Social History   Tobacco Use   Smoking status: Every Day    Current packs/day: 0.25    Average packs/day: 0.3 packs/day  for 15.8 years (4.0 ttl pk-yrs)    Types: Cigarettes    Start date: 10/10/2007    Passive exposure: Never   Smokeless tobacco: Never   Tobacco comments:    6 cigs per day 04/14/21.   Vaping Use   Vaping status: Never Used  Substance Use Topics   Alcohol use: Yes    Comment: 2 drinks per week.   Drug use: Not Currently     Allergies   Percocet [oxycodone -acetaminophen ]   Review of Systems Review of Systems As noted in HPI Physical Exam Triage Vital Signs ED Triage Vitals  Encounter Vitals Group     BP 08/02/23 1601 126/88     Systolic BP Percentile --      Diastolic BP Percentile --      Pulse Rate 08/02/23 1601 75     Resp 08/02/23 1601 18     Temp 08/02/23 1601 98.7 F (37.1 C)     Temp Source 08/02/23 1601 Oral     SpO2 08/02/23 1601 96 %     Weight --      Height --      Head Circumference --      Peak Flow --      Pain Score 08/02/23 1558 10     Pain Loc --      Pain Education --      Exclude from Growth Chart --    No data found.  Updated Vital Signs BP 126/88 (BP Location: Right Arm)   Pulse 75   Temp 98.7 F (37.1 C) (Oral)   Resp 18   SpO2 96%   Visual Acuity Right Eye Distance:   Left Eye Distance:   Bilateral Distance:    Right Eye Near:   Left Eye Near:    Bilateral Near:     Physical Exam Vitals and nursing note reviewed.  Constitutional:      General: She is not in acute distress.    Appearance: She is obese. She is not toxic-appearing.  HENT:     Right Ear: External ear normal.     Left Ear: External ear normal.  Eyes:     General: No scleral icterus.    Conjunctiva/sclera: Conjunctivae normal.  Pulmonary:     Effort: Pulmonary effort is normal.  Musculoskeletal:        General: Normal range of motion.     Cervical back: Neck supple.     Comments: BACK- has  increased lordosis. No pain present with palpation, but has limited ROM to L laterally and anteriorly due to pain. Has negative SLR.  L mid anterior thigh is tender to  palpation. I did not palpate any tightness, masses or indentions.   Skin:    General: Skin is warm and dry.  Neurological:     Mental Status: She is alert and oriented to person, place, and time.     Gait: Gait normal.     Deep Tendon Reflexes: Reflexes normal.  Psychiatric:        Mood and Affect: Mood normal.        Behavior: Behavior normal.        Thought Content: Thought content normal.        Judgment: Judgment normal.      UC Treatments / Results  Labs (all labs ordered are listed, but only abnormal results are displayed) Labs Reviewed - No data to display  EKG   Radiology No results found.  Procedures Procedures (including critical care time)  Medications Ordered in UC Medications  ketorolac  (TORADOL ) 30 MG/ML injection 30 mg (30 mg Intramuscular Given 08/02/23 1635)    Initial Impression / Assessment and Plan / UC Course  I have reviewed the triage vital signs and the nursing notes. She was given Toradol  30 mg IM and her pain level went from 10/10 to 7/10 She was advised to FU with [facility].    Final Clinical Impressions(s) / UC Diagnoses   Final diagnoses:  Left lumbar radiculopathy     Discharge Instructions      Please follow up with [facility] if you dont improve in the next couple of days.    ED Prescriptions   None    PDMP not reviewed this encounter.   Vonda Guadeloupe, PA-C 08/02/23 1704

## 2023-08-02 NOTE — Discharge Instructions (Addendum)
 Please follow up with [facility] if you dont improve in the next couple of days.

## 2023-08-03 ENCOUNTER — Ambulatory Visit: Payer: Self-pay

## 2023-08-03 ENCOUNTER — Ambulatory Visit: Admitting: Family Medicine

## 2023-08-03 VITALS — BP 120/84 | HR 80 | Temp 97.9°F | Ht 66.0 in | Wt 320.0 lb

## 2023-08-03 DIAGNOSIS — M5416 Radiculopathy, lumbar region: Secondary | ICD-10-CM

## 2023-08-03 MED ORDER — KETOROLAC TROMETHAMINE 60 MG/2ML IM SOLN
60.0000 mg | Freq: Once | INTRAMUSCULAR | Status: AC
  Administered 2023-08-03: 60 mg via INTRAMUSCULAR

## 2023-08-03 NOTE — Progress Notes (Signed)
 Assessment & Plan   Assessment/Plan:     Assessment & Plan Lumbar radiculopathy Severe left leg and lower back pain persisting for over a week, unresponsive to tramadol , methylprednisolone , and dexamethasone, diclofenac , lidocaine, cyclobenzaprine , methocarbamol .  Pain exacerbated by walking and certain positions, with no numbness, weakness, or incontinence. Previous imaging showed degenerative disc disease without acute findings. Likely lumbar radiculopathy given chronic symptoms and past sciatica on the right side, though current symptoms are on the left. Current medication regimen poses risk for GI upset, ulcers, bleeding, and sedation from muscle relaxers. - Order MRI of lumbar spine without contrast - Refer to orthopedic surgery for further evaluation and management - Administer Toradol  60 mg injection for pain relief - Counsel on potential side effects of current medications, including risk of GI upset, ulcers, and bleeding - Advise to hold diclofenac  while continuing methylprednisolone  - Recommend emergency department visit if pain persists or worsens       Medications Discontinued During This Encounter  Medication Reason   diclofenac  (VOLTAREN ) 75 MG EC tablet    meloxicam  (MOBIC ) 15 MG tablet    diclofenac  (VOLTAREN ) 75 MG EC tablet     Return if symptoms worsen or fail to improve.        Subjective:   Encounter date: 08/03/2023  Diana Garrison is a 53 y.o. female who has Tobacco use disorder; Spider veins of both lower extremities; Moderate episode of recurrent major depressive disorder (HCC); Right knee meniscal tear; Vitamin D  insufficiency; Seasonal allergic rhinitis; Hyperlipidemia; Other hyperlipidemia; Depression; Pre-diabetes; Morbid obesity (HCC) with starting BMI 54; Polyphagia; OSA on CPAP; Decreased libido; Positional headache; Postmenopausal vaginal bleeding; and Thickened endometrium on their problem list..   She  has a past medical history of  Hyperlipidemia..   She presents with chief complaint of Leg Pain (Says leg pain has been going on for over a week can hardly walk has taken otc meds and nothing is working feels like pulling in left leg) and Back Pain .   Discussed the use of AI scribe software for clinical note transcription with the patient, who gave verbal consent to proceed.  History of Present Illness Diana Garrison is a 53 year old female with a history of lumbar spine issues who presents with severe left leg and lower back pain.  She has been experiencing severe pain in her left leg and lower back for over a week, which began after a flight. The pain is intense and unrelenting, significantly impacting her ability to walk, sleep, and perform daily activities. She describes the pain as distressing and states, 'I can't do anything.'  She has sought care at urgent care facilities, receiving a dexamethasone shot and a Toradol  shot, neither of which provided relief. She has been prescribed tramadol , methylprednisolone ,, but reports no improvement with these medications. She is also taking Flexeril  (cyclobenzaprine ) 10 mg three times a day, methocarbamol  500 mg four times a day, and diclofenac  75 mg twice a day, along with OTC acetaminophen , but states that none of these medications are alleviating her pain. She mentions possibly over-medicating due to the severity of her pain.  In the past, she has experienced back pain, but it was not as intense and was located on the right side. She has a history of lumbar spine issues, with a previous lumbar spine evaluation in 2015 showing degenerative disc disease.  Recent x-ray reconfirmed these findings.  No incontinence, numbness, or weakness associated with her current symptoms.  No pain in the calf.  She reports pain when walking or lying in certain positions, particularly when leaning to the left side. She has tried lidocaine patches without relief. She has not been taking meloxicam   and does not use over-the-counter ibuprofen .  Patient first said that she was going to have to "lay down and die from the pain.".  When asked about it further, patient stated that this was just an expression and that she had no intent or plan for suicide or homicide.       Past Surgical History:  Procedure Laterality Date   CESAREAN SECTION N/A    Phreesia 11/12/2019   GANGLION CYST EXCISION Bilateral    x2    Outpatient Medications Prior to Visit  Medication Sig Dispense Refill   cyclobenzaprine  (FLEXERIL ) 10 MG tablet Take 1 tablet (10 mg total) by mouth 3 (three) times daily as needed for muscle spasms. 30 tablet 1   lidocaine (LIDODERM) 5 % Place 1 patch onto the skin daily. Remove & Discard patch within 12 hours or as directed by MD 30 patch 0   magnesium  30 MG tablet Take 1 tablet (30 mg total) by mouth 2 (two) times daily. (Patient taking differently: Take 30 mg by mouth once.) 60 tablet 0   medroxyPROGESTERone  (PROVERA ) 5 MG tablet Take 1 tablet (5 mg total) by mouth daily. 30 tablet 0   methocarbamol  (ROBAXIN ) 500 MG tablet Take 1 tablet (500 mg total) by mouth 4 (four) times daily. 60 tablet 1   methylPREDNISolone  (MEDROL  DOSEPAK) 4 MG TBPK tablet Take as directed on package 21 tablet 0   Multiple Vitamin (MULTIVITAMIN) tablet Take 1 tablet by mouth daily.     rosuvastatin  (CRESTOR ) 20 MG tablet Take 1 tablet (20 mg total) by mouth daily. 90 tablet 3   topiramate  (TOPAMAX ) 25 MG tablet Take 3 tablets (75 mg total) by mouth daily. 90 tablet 5   traMADol  (ULTRAM ) 50 MG tablet Take 1 tablet (50 mg total) by mouth every 6 (six) hours as needed. 15 tablet 0   diclofenac  (VOLTAREN ) 75 MG EC tablet Take 1 tablet (75 mg total) by mouth 2 (two) times daily as needed for pain. 60 tablet 0   diclofenac  (VOLTAREN ) 75 MG EC tablet Take 1 tablet (75 mg total) by mouth 2 (two) times daily as needed for pain. 60 tablet 0   meloxicam  (MOBIC ) 15 MG tablet Take 1 tablet (15 mg total) by mouth  daily. 90 tablet 1   zinc  gluconate 50 MG tablet Take 1 tablet (50 mg total) by mouth daily. (Patient not taking: Reported on 08/03/2023) 30 tablet 0   No facility-administered medications prior to visit.    Family History  Problem Relation Age of Onset   Obesity Father    Cirrhosis Father    Pulmonary embolism Sister    Lupus Brother    Breast cancer Neg Hx     Social History   Socioeconomic History   Marital status: Married    Spouse name: Not on file   Number of children: 2   Years of education: Not on file   Highest education level: GED or equivalent  Occupational History   Occupation: CNA    Comment: Management consultant. Behavior[employer]  Tobacco Use   Smoking status: Every Day    Current packs/day: 0.25    Average packs/day: 0.3 packs/day for 15.8 years (4.0 ttl pk-yrs)    Types: Cigarettes    Start date: 10/10/2007    Passive exposure: Never   Smokeless tobacco:  Never   Tobacco comments:    6 cigs per day 04/14/21.   Vaping Use   Vaping status: Never Used  Substance and Sexual Activity   Alcohol use: Yes    Comment: 2 drinks per week.   Drug use: Not Currently   Sexual activity: Yes    Partners: Male    Birth control/protection: None, Condom    Comment: 10/04/14: Not sexually active x5 mo due to spouse having had a CVA  Other Topics Concern   Not on file  Social History Narrative   Are you right handed or left handed? Right   Are you currently employed ?    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   Who lives with you? husband   What type of home do you live in: 1 story or 2 story? one   Caffeine 1 time a week     Social Drivers of Corporate investment banker Strain: Low Risk  (08/03/2023)   Overall Financial Resource Strain (CARDIA)    Difficulty of Paying Living Expenses: Not hard at all  Food Insecurity: No Food Insecurity (08/03/2023)   Hunger Vital Sign    Worried About Running Out of Food in the Last Year: Never  true    Ran Out of Food in the Last Year: Never true  Transportation Needs: No Transportation Needs (08/03/2023)   PRAPARE - Administrator, Civil Service (Medical): No    Lack of Transportation (Non-Medical): No  Physical Activity: Insufficiently Active (08/03/2023)   Exercise Vital Sign    Days of Exercise per Week: 3 days    Minutes of Exercise per Session: 20 min  Stress: No Stress Concern Present (08/03/2023)   [employer] of Occupational Health - Occupational Stress Questionnaire    Feeling of Stress : Only a little  Social Connections: Socially Integrated (08/03/2023)   Social Connection and Isolation Panel [NHANES]    Frequency of Communication with Friends and Family: More than three times a week    Frequency of Social Gatherings with Friends and Family: Twice a week    Attends Religious Services: 1 to 4 times per year    Active Member of [employer] or Organizations: Yes    Attends Banker Meetings: 1 to 4 times per year    Marital Status: Married  Catering manager Violence: Not At Risk (07/15/2018)   Humiliation, Afraid, Rape, and Kick questionnaire    Fear of Current or Ex-Partner: No    Emotionally Abused: No    Physically Abused: No    Sexually Abused: No                                                                                                  Objective:  Physical Exam: BP 120/84 (BP Location: Left Arm, Patient Position: Sitting, Cuff Size: Large)   Pulse 80   Temp 97.9 F (36.6 C) (Temporal)   Ht 5\' 6"  (1.676 m)   Wt (!) 320 lb (145.2 kg)   SpO2 97%   BMI  51.65 kg/m    Physical Exam GENERAL: Alert, cooperative, well developed, no acute distress. HEENT: Normocephalic, normal oropharynx, moist mucous membranes. CHEST: Clear to auscultation bilaterally, no wheezes, rhonchi, or crackles. CARDIOVASCULAR: Normal heart rate and rhythm, S1 and S2 normal without murmurs. ABDOMEN: Soft, non-tender, non-distended, without organomegaly,  normal bowel sounds. EXTREMITIES: No cyanosis or edema. Extremities non-tender on palpation, able to move spontaneously. MUSCULOSKELETAL: No lumbar spasm. NEUROLOGICAL: Cranial nerves grossly intact, moves all extremities without gross motor or sensory deficit. Bilateral lower extremity reflexes symmetric and intact.     DG Lumbar Spine Complete Result Date: 07/31/2023 CLINICAL DATA:  Lumbar radiculopathy EXAM: LUMBAR SPINE - COMPLETE 4+ VIEW COMPARISON:  None Available. FINDINGS: No fracture or dislocation. Normal alignment. Mild narrowing of interspaces L2-S1 with small anterior endplate spurs. IMPRESSION: 1. No acute findings. 2. Mild multilevel degenerative disc disease. Electronically Signed   By: Nicoletta Barrier M.D.   On: 07/31/2023 08:24    No results found for this or any previous visit (from the past 2160 hours).      Carnell Christian, MD, MS

## 2023-08-03 NOTE — Telephone Encounter (Signed)
 Noted. Dm/cma

## 2023-08-03 NOTE — Telephone Encounter (Signed)
  Chief Complaint: leg pain Symptoms: L leg pain, lower back and L thigh, 12/10 pain  Frequency: last week  Pertinent Negatives: Patient denies swelling Disposition: [] ED /[] Urgent Care (no appt availability in office) / [x] Appointment(In office/virtual)/ []  [facility]/ [] Home Care/ [] Refused Recommended Disposition /[] [facility]/ []  Follow-up with PCP Additional Notes: pt states last week she flew home on a smaller place and the seats were tight so she felt cramped but other wise no injury noted to LLE. Pt states she has been to UC and the ED and received injections, tramadol , taking Tylenol , Diclofenac  and robaxin  and nothing is touching the pain. Pain is so bad pt states hurts to walk. Scheduled appt today at 320.   Copied from CRM [id number]. Topic: Clinical - Red Word Triage >> Aug 03, 2023 10:34 AM Kita Perish H wrote: [facility] that prompted transfer to Nurse Triage: Left leg pain over a week, can barley walk Reason for Disposition  [1] SEVERE pain (e.g., excruciating, unable to do any normal activities) AND [2] not improved after 2 hours of pain medicine  Answer Assessment - Initial Assessment Questions 1. ONSET: "When did the pain start?"      Last week  2. LOCATION: "Where is the pain located?"      L leg and back  3. PAIN: "How bad is the pain?"    (Scale 1-10; or mild, moderate, severe)   -  MILD (1-3): doesn't interfere with normal activities    -  MODERATE (4-7): interferes with normal activities (e.g., work or school) or awakens from sleep, limping    -  SEVERE (8-10): excruciating pain, unable to do any normal activities, unable to walk     12/10 6. OTHER SYMPTOMS: "Do you have any other symptoms?" (e.g., chest pain, back pain, breathing difficulty, swelling, rash, fever, numbness, weakness)     Hurts to walk  Protocols used: Leg Pain-A-AH

## 2023-08-04 ENCOUNTER — Other Ambulatory Visit: Payer: Self-pay

## 2023-08-04 ENCOUNTER — Emergency Department (HOSPITAL_COMMUNITY)

## 2023-08-04 ENCOUNTER — Encounter (HOSPITAL_COMMUNITY): Payer: Self-pay | Admitting: Emergency Medicine

## 2023-08-04 ENCOUNTER — Other Ambulatory Visit (HOSPITAL_COMMUNITY): Payer: Self-pay

## 2023-08-04 ENCOUNTER — Emergency Department (HOSPITAL_COMMUNITY)
Admission: EM | Admit: 2023-08-04 | Discharge: 2023-08-04 | Disposition: A | Discharge status: Home / Self Care | Attending: Emergency Medicine | Admitting: Emergency Medicine

## 2023-08-04 DIAGNOSIS — S79912A Unspecified injury of left hip, initial encounter: Secondary | ICD-10-CM | POA: Diagnosis not present

## 2023-08-04 DIAGNOSIS — M79662 Pain in left lower leg: Secondary | ICD-10-CM | POA: Diagnosis not present

## 2023-08-04 DIAGNOSIS — M1712 Unilateral primary osteoarthritis, left knee: Secondary | ICD-10-CM | POA: Diagnosis not present

## 2023-08-04 DIAGNOSIS — S8992XA Unspecified injury of left lower leg, initial encounter: Secondary | ICD-10-CM | POA: Diagnosis not present

## 2023-08-04 DIAGNOSIS — M545 Low back pain, unspecified: Secondary | ICD-10-CM | POA: Insufficient documentation

## 2023-08-04 DIAGNOSIS — S79922A Unspecified injury of left thigh, initial encounter: Secondary | ICD-10-CM | POA: Diagnosis not present

## 2023-08-04 DIAGNOSIS — M79652 Pain in left thigh: Secondary | ICD-10-CM | POA: Insufficient documentation

## 2023-08-04 DIAGNOSIS — D72829 Elevated white blood cell count, unspecified: Secondary | ICD-10-CM | POA: Insufficient documentation

## 2023-08-04 LAB — BASIC METABOLIC PANEL WITH GFR
Anion gap: 7 (ref 5–15)
BUN: 15 mg/dL (ref 6–20)
CO2: 23 mmol/L (ref 22–32)
Calcium: 8.9 mg/dL (ref 8.9–10.3)
Chloride: 105 mmol/L (ref 98–111)
Creatinine, Ser: 0.84 mg/dL (ref 0.44–1.00)
GFR, Estimated: >60 mL/min (ref >60)
Glucose, Bld: 85 mg/dL (ref 70–99)
Potassium: 4.6 mmol/L (ref 3.5–5.1)
Sodium: 135 mmol/L (ref 135–145)

## 2023-08-04 LAB — CBC WITH DIFFERENTIAL/PLATELET
Abs Immature Granulocytes: 0.09 10*3/uL — ABNORMAL HIGH (ref 0.00–0.07)
Basophils Absolute: 0 10*3/uL (ref 0.0–0.1)
Basophils Relative: 0 %
Eosinophils Absolute: 0.1 10*3/uL (ref 0.0–0.5)
Eosinophils Relative: 1 %
HCT: 48.9 % — ABNORMAL HIGH (ref 36.0–46.0)
Hemoglobin: 15.7 g/dL — ABNORMAL HIGH (ref 12.0–15.0)
Immature Granulocytes: 1 %
Lymphocytes Relative: 22 %
Lymphs Abs: 3.3 10*3/uL (ref 0.7–4.0)
MCH: 30.8 pg (ref 26.0–34.0)
MCHC: 32.1 g/dL (ref 30.0–36.0)
MCV: 95.9 fL (ref 80.0–100.0)
Monocytes Absolute: 0.8 10*3/uL (ref 0.1–1.0)
Monocytes Relative: 6 %
Neutro Abs: 10.7 10*3/uL — ABNORMAL HIGH (ref 1.7–7.7)
Neutrophils Relative %: 70 %
Platelets: 284 10*3/uL (ref 150–400)
RBC: 5.1 MIL/uL (ref 3.87–5.11)
RDW: 15.6 % — ABNORMAL HIGH (ref 11.5–15.5)
WBC: 15.1 10*3/uL — ABNORMAL HIGH (ref 4.0–10.5)
nRBC: 0 % (ref 0.0–0.2)

## 2023-08-04 LAB — CK: Total CK: 154 U/L (ref 38–234)

## 2023-08-04 MED ORDER — ACETAMINOPHEN 500 MG PO TABS
500.0000 mg | ORAL_TABLET | Freq: Four times a day (QID) | ORAL | 0 refills | Status: DC | PRN
  Filled 2023-08-04: qty 30, 8d supply, fill #0

## 2023-08-04 MED ORDER — HYDROCODONE-ACETAMINOPHEN 5-325 MG PO TABS
1.0000 | ORAL_TABLET | Freq: Once | ORAL | Status: AC
  Administered 2023-08-04: 1 via ORAL
  Filled 2023-08-04: qty 1

## 2023-08-04 MED ORDER — ACETAMINOPHEN 500 MG PO TABS: 500.0000 mg | ORAL_TABLET | Freq: Four times a day (QID) | ORAL | 0 refills | Status: DC | PRN

## 2023-08-04 MED ORDER — GABAPENTIN 100 MG PO CAPS
100.0000 mg | ORAL_CAPSULE | Freq: Three times a day (TID) | ORAL | 0 refills | Status: DC
  Filled 2023-08-04: qty 60, 20d supply, fill #0

## 2023-08-04 MED ORDER — GABAPENTIN 100 MG PO CAPS: 100.0000 mg | ORAL_CAPSULE | Freq: Three times a day (TID) | ORAL | 0 refills | Status: DC

## 2023-08-04 NOTE — Discharge Instructions (Addendum)
 The workup in the emergency room reveals that you have some arthritis of the hip, but there is no evidence of blood clot or any infection.  We recommend that you continue to take Mobic  and your muscle relaxant. Follow-up with your primary care doctor in 1 week if the symptoms do not improve.  Cold compresses over the affected area also recommended.

## 2023-08-04 NOTE — ED Provider Notes (Addendum)
 [place] EMERGENCY DEPARTMENT AT [facility] Provider Note   CSN: [id number] Arrival date & time: 08/04/23  1451     History  Chief Complaint  Patient presents with   Leg Pain    Patient presents with EMS today for L leg and L back pain ongoing for a week, has had prednisone , toradol  etc and no improvement    Diana Garrison is a 53 y.o. female.  HPI    53 year old patient comes in with chief complaint of left anterior thigh pain.  Patient has past medical history of elevated BMI.  She has had some back pain and left thigh pain for the last week or so.  She has taken muscle relaxants, Mobic , tramadol , Toradol , prednisone  without any relief.  She has seen her PCP, she was informed that she most likely has sciatica, but she does not think it is sciatica.  She had sciatica on the other side and it felt different.  This pain is described as anterior thigh pain that is constant, pressure type pain.  There is no associated numbness or tingling.  Pain is worse with any walking.  Home Medications Prior to Admission medications   Medication Sig Start Date End Date Taking? Authorizing Provider  acetaminophen  (TYLENOL ) 500 MG tablet Take 1 tablet (500 mg total) by mouth every 6 (six) hours as needed. 08/04/23  Yes Deatra Face, MD  cyclobenzaprine  (FLEXERIL ) 10 MG tablet Take 1 tablet (10 mg total) by mouth 3 (three) times daily as needed for muscle spasms. 03/26/23   Graig Lawyer, MD  lidocaine (LIDODERM) 5 % Place 1 patch onto the skin daily. Remove & Discard patch within 12 hours or as directed by MD 08/01/23   Graig Lawyer, MD  magnesium  30 MG tablet Take 1 tablet (30 mg total) by mouth 2 (two) times daily. Patient taking differently: Take 30 mg by mouth once. 11/07/21   Graig Lawyer, MD  medroxyPROGESTERone  (PROVERA ) 5 MG tablet Take 1 tablet (5 mg total) by mouth daily. 07/19/23     methocarbamol  (ROBAXIN ) 500 MG tablet Take 1 tablet (500 mg total) by mouth 4  (four) times daily. 01/24/23   Graig Lawyer, MD  methylPREDNISolone  (MEDROL  DOSEPAK) 4 MG TBPK tablet Take as directed on package 07/31/23   Carin Charleston, MD  Multiple Vitamin (MULTIVITAMIN) tablet Take 1 tablet by mouth daily.    [provider]  rosuvastatin  (CRESTOR ) 20 MG tablet Take 1 tablet (20 mg total) by mouth daily. 03/26/23   Graig Lawyer, MD  topiramate  (TOPAMAX ) 25 MG tablet Take 3 tablets (75 mg total) by mouth daily. 01/16/23   Ellene Gustin, MD  traMADol  (ULTRAM ) 50 MG tablet Take 1 tablet (50 mg total) by mouth every 6 (six) hours as needed. 07/31/23   Carin Charleston, MD  zinc  gluconate 50 MG tablet Take 1 tablet (50 mg total) by mouth daily. Patient not taking: Reported on 08/03/2023 11/07/21   Graig Lawyer, MD      Allergies    Percocet [oxycodone -acetaminophen ]    Review of Systems   Review of Systems  All other systems reviewed and are negative.   Physical Exam Updated Vital Signs BP 136/79 (BP Location: Left Arm)   Pulse 88   Temp 98.1 F (36.7 C)   Resp 16   Ht 5\' 6"  (1.676 m)   Wt (!) 149.7 kg   SpO2 98%   BMI 53.26 kg/m  Physical Exam Vitals and  nursing note reviewed.  Constitutional:      Appearance: She is well-developed.  HENT:     Head: Atraumatic.  Cardiovascular:     Rate and Rhythm: Normal rate.  Pulmonary:     Effort: Pulmonary effort is normal.  Musculoskeletal:     Cervical back: Normal range of motion and neck supple.     Comments: Pt has tenderness over the lower lumbar region No step offs, no erythema. Able to ambulate No focal numbness noted Patient had negative passive leg raise test  Skin:    General: Skin is warm and dry.  Neurological:     Mental Status: She is alert and oriented to person, place, and time.     ED Results / Procedures / Treatments   Labs (all labs ordered are listed, but only abnormal results are displayed) Labs Reviewed  CBC WITH DIFFERENTIAL/PLATELET - Abnormal; Notable for the  following components:      Result Value   WBC 15.1 (*)    Hemoglobin 15.7 (*)    HCT 48.9 (*)    RDW 15.6 (*)    Neutro Abs 10.7 (*)    Abs Immature Granulocytes 0.09 (*)    All other components within normal limits  CK  BASIC METABOLIC PANEL WITH GFR    EKG None  Radiology VAS US  LOWER EXTREMITY VENOUS (DVT) (7a-7p) Result Date: 08/04/2023  Lower Venous DVT Study Patient Name:  Diana Garrison  Date of Exam:   08/04/2023 Medical Rec #: [id number]           Accession #:    [id number] Date of Birth: Jul 14, 1970          Patient Gender: F Patient Age:   37 years Exam Location:  [facility] Procedure:      VAS US  LOWER EXTREMITY VENOUS (DVT) Referring Phys: Gae Jointer Sira Adsit --------------------------------------------------------------------------------  Indications: Left lateral hip pain that radiates into thigh.  Limitations: Body habitus. Comparison Study: No prior study on file Performing Technologist: Carleene Chase RVS  Examination Guidelines: A complete evaluation includes B-mode imaging, spectral Doppler, color Doppler, and power Doppler as needed of all accessible portions of each vessel. Bilateral testing is considered an integral part of a complete examination. Limited examinations for reoccurring indications may be performed as noted. The reflux portion of the exam is performed with the patient in reverse Trendelenburg.  +-----+---------------+---------+-----------+----------+--------------+ RIGHTCompressibilityPhasicitySpontaneityPropertiesThrombus Aging +-----+---------------+---------+-----------+----------+--------------+ CFV  Full           Yes      Yes                                 +-----+---------------+---------+-----------+----------+--------------+ SFJ  Full                                                        +-----+---------------+---------+-----------+----------+--------------+    +---------+---------------+---------+-----------+----------+---------------+ LEFT     CompressibilityPhasicitySpontaneityPropertiesThrombus Aging  +---------+---------------+---------+-----------+----------+---------------+ CFV      Full           Yes      Yes                                  +---------+---------------+---------+-----------+----------+---------------+ SFJ  Full                                                         +---------+---------------+---------+-----------+----------+---------------+ FV Prox  Full           Yes      Yes                                  +---------+---------------+---------+-----------+----------+---------------+ FV Mid   Full           Yes      Yes                                  +---------+---------------+---------+-----------+----------+---------------+ FV Distal                                             Patent by color +---------+---------------+---------+-----------+----------+---------------+ PFV      Full           Yes      Yes                                  +---------+---------------+---------+-----------+----------+---------------+ POP      Full           Yes      Yes                                  +---------+---------------+---------+-----------+----------+---------------+ PTV      Full                                                         +---------+---------------+---------+-----------+----------+---------------+ PERO     Full                                                         +---------+---------------+---------+-----------+----------+---------------+    Summary: RIGHT: - No evidence of common femoral vein obstruction.   LEFT: - There is no evidence of deep vein thrombosis in the lower extremity.  - No cystic structure found in the popliteal fossa.  *See table(s) above for measurements and observations.    Preliminary    DG Hip Unilat W or Wo Pelvis 2-3 Views Left Result  Date: 08/04/2023 CLINICAL DATA:  Traumatic pain to left thigh for 1 week. EXAM: DG HIP (WITH OR WITHOUT PELVIS) 2-3V LEFT; LEFT KNEE - COMPLETE 4+ VIEW COMPARISON:  None Available. FINDINGS: Left knee: There is no evidence of hip fracture or dislocation. There is no joint effusion. Mild degenerative changes are present in the patellofemoral compartment. The soft tissues are within normal limits. Left hip: There is no evidence of  acute fracture or dislocation. Joint space is maintained at the hips bilaterally. There is sclerosis at the sacroiliac joint on the left, compatible with sacroiliitis. Degenerative changes are noted pubis symphysis. IMPRESSION: 1. Mild degenerative changes at the left knee with no evidence of acute fracture or dislocation. 2. No acute fracture or dislocation at the left hip. 3. Sclerosis at the left sacroiliac joint, compatible with sacroiliitis. Electronically Signed   By: Wyvonnia Heimlich M.D.   On: 08/04/2023 17:54   DG Knee Complete 4 Views Left Result Date: 08/04/2023 CLINICAL DATA:  Traumatic pain to left thigh for 1 week. EXAM: DG HIP (WITH OR WITHOUT PELVIS) 2-3V LEFT; LEFT KNEE - COMPLETE 4+ VIEW COMPARISON:  None Available. FINDINGS: Left knee: There is no evidence of hip fracture or dislocation. There is no joint effusion. Mild degenerative changes are present in the patellofemoral compartment. The soft tissues are within normal limits. Left hip: There is no evidence of acute fracture or dislocation. Joint space is maintained at the hips bilaterally. There is sclerosis at the sacroiliac joint on the left, compatible with sacroiliitis. Degenerative changes are noted pubis symphysis. IMPRESSION: 1. Mild degenerative changes at the left knee with no evidence of acute fracture or dislocation. 2. No acute fracture or dislocation at the left hip. 3. Sclerosis at the left sacroiliac joint, compatible with sacroiliitis. Electronically Signed   By: Wyvonnia Heimlich M.D.   On: 08/04/2023 17:54     Procedures Procedures    Medications Ordered in ED Medications  HYDROcodone -acetaminophen  (NORCO/VICODIN) 5-325 MG per tablet 1 tablet (has no administration in time range)  HYDROcodone -acetaminophen  (NORCO/VICODIN) 5-325 MG per tablet 1 tablet (1 tablet Oral Given 08/04/23 1702)    ED Course/ Medical Decision Making/ A&P                                 Medical Decision Making Amount and/or Complexity of Data Reviewed Labs: ordered. Radiology: ordered.  Risk OTC drugs. Prescription drug management.  53 year old female comes in with chief complaint of back pain, thigh pain. She has history of elevated BMI.    Differential diagnosis considered this patient includes lumbar spine impingement, hip arthritis, knee arthritis, DVT, cellulitis, myalgias/medication side effects.  Based on exam, no clinical concerns for infection. Clinically, patient has some symptoms that are not consistent with radiculopathy.  Particular the pain is mostly in the anterior region and is worse with ambulation.  Certainly high lumbar spine impingement can cause some pain in that region, but given that she has not responded to prednisone , muscle relaxants, NSAIDs-it is possible that the etiology is something else.  We ordered x-ray of her hip and ultrasound DVT. Basic labs also ordered.  Reassessment: CBC has mild elevated white count.  Patient's metabolic profile is normal.  Elevated white count likely because of prednisone .  Patient CK is normal.  X-ray of the hip was ordered and independently interpreted by me.  There is evidence of mild arthritis.  I reassessed the patient.  I discussed the results with her.  We have advised that she will need to follow-up with her PCP, she might need physical therapy or additional diagnostic workup if not getting better. It seems like MRI ordered.  I shared with her that the upper lumbar spine nerve impingement can cause anterior thigh pain.  She will continue  to take her Mobic , will add Tylenol .  She will continue to take her muscle relaxants.  I do not feel comfortable starting her on steroids right now.  I reviewed patient's records including PCP note and x-ray of the lumbar spine from 5-20.  It appears that patient does have upper lumbar spine bone spurs.  Final Clinical Impression(s) / ED Diagnoses Final diagnoses:  Left thigh pain    Rx / DC Orders ED Discharge Orders          Ordered    acetaminophen  (TYLENOL ) 500 MG tablet  Every 6 hours PRN        08/04/23 1957              Deatra Face, MD 08/04/23 2030

## 2023-08-04 NOTE — Progress Notes (Signed)
 VASCULAR LAB    Left lower extremity venous duplex has been performed.  See CV proc for preliminary results.  Relayed negative results to Dr. Lewis Red via secure chat  Carleene Chase, RVT 08/04/2023, 7:47 PM

## 2023-08-07 ENCOUNTER — Encounter: Payer: Self-pay | Admitting: Family Medicine

## 2023-08-08 NOTE — Progress Notes (Signed)
 NEUROLOGY FOLLOW UP OFFICE NOTE  Diana Garrison [id number]  Subjective:  Diana Garrison is a 53 y.o. year old right handed female with a history of OSA, obesity, pre-DM, HLD, vit D deficiency, depression who we last saw on 12/20/22 for nocturnal headaches.  To briefly review: 12/20/22: Patient's headaches occur only at night. When she sits up, she thinks it slowly improves. She wakes up from sleep. She does not think she has any visual symptoms. Patient is taking ibuprofen , alleve, tylenol . She is taking it every 2 hours during the night. Symptoms typically last 30 minutes and is typically is located on the front of her head. She rates the pain as 10/10. It feels like an intense pressure, like someone is stepping on her head. She feels like blood is pulsating. She denies photophobia, phonophobia, nausea, vomiting. She has the headache 2-3 times per week. She does not have symptoms during the day. She denies any significant neck pain.   About 20 years ago she had headaches and double vision. She saw a neurologist and had a lumbar puncture with high volume tap. After this, her visual issue was rapidly corrected. Due to concern for IIH, patient was sent to ophtho. Patient was seen at [facility] on 12/12/22 and had dilated fundoscopy that reports normal optic disc bilaterally (no papilledema).   Patient has OSA and is supposed to be on CPAP. She has not been using it for the last few months though. She only feels tired in the morning when she has had a headache during the night. She rarely naps, only on off days, but does not get headaches at that time.   She is currently on robaxin  and mobic  for knee pain.    Birth control? Patient takes progesterone for heavy bleeding/pre-menopause Smoker? 6 cigarettes per day EtOH use: 2 glasses of wine per week Restrictive diet: No. She denies any recent weight changes Caffeine use: None currently  Most recent Assessment and Plan (12/20/22): Diana Garrison is a 53 y.o. female who presents for evaluation of nocturnal headaches. She has a relevant medical history of OSA (not currently on CPAP), obesity, pre-DM, HLD, vit D deficiency, depression. Her neurological examination is essentially normal today, including non-dilated fundoscopy. Given her history of prior headaches and vision changes that responded well to lumbar puncture and the positional nature of her headaches, idiopathic intracranial hypertension is the most likely etiology of her headaches. We discussed repeating LP, but patient was not interested given the pain she experienced previously. She would rather empirically treat. She also has OSA and was unable to tolerate CPAP. This could also be contributing to headaches.   PLAN: -Discussed LP with opening pressure, but patient would prefer to treat empirically for IIH -Discussed Diamox and Topamax . Patient preferred to try Topamax . Will start 25 mg daily for 1 week, then increase 50 mg thereafter. -Consider sleeping more with head elevated -Patient to reach out to her sleep medicine doctor to discuss other options for OSA as she has been unable to tolerate CPAP  Since their last visit: Patient's headaches had not improved, so Topamax  was increased to 75 mg daily on 01/16/23. Her headaches have greatly improved. She is not really having a headache now. She is still on Topamax  75 mg daily.   She has not seen her sleep medicine doctor but states she will.  Patient has new left thigh pain. It started after a plane trip. It is located on the top of the left thigh.  She saw her PCP who gave her muscle relaxer. She was not able to lift her leg on 07/31/23 so she went to the ED. She has having a lot of burning and a cramp sensation. Xrays showed evidence of left sacroilitis and bone spurs in lumbar spine. She was given a prednisone  shot and prednisone  taper. Neither of these helped. She went to urgent care who gave her toradol . This did not help. She  saw ortho who also said symptoms were coming from her back. She was given gabapentin  300 mg TID. She is able to walk better now, but she still has spasms. She feels no one is giving her anything for pain. She is having an MRI of her lumbar spine on 08/19/23.   Pain was worse with walking but with no associated numbness or tingling.     MEDICATIONS:  Outpatient Encounter Medications as of 08/16/2023  Medication Sig   acetaminophen  (TYLENOL ) 500 MG tablet Take 1 tablet (500 mg total) by mouth every 6 (six) hours as needed.   cyclobenzaprine  (FLEXERIL ) 10 MG tablet Take 1 tablet (10 mg total) by mouth 3 (three) times daily as needed for muscle spasms.   gabapentin  (NEURONTIN ) 300 MG capsule Take 1 capsule (300 mg total) by mouth 3 (three) times daily.   magnesium  30 MG tablet Take 1 tablet (30 mg total) by mouth 2 (two) times daily. (Patient taking differently: Take 30 mg by mouth once.)   medroxyPROGESTERone  (PROVERA ) 5 MG tablet Take 1 tablet (5 mg total) by mouth daily.   methocarbamol  (ROBAXIN ) 500 MG tablet Take 1 tablet (500 mg total) by mouth 4 (four) times daily.   Multiple Vitamin (MULTIVITAMIN) tablet Take 1 tablet by mouth daily.   rosuvastatin  (CRESTOR ) 20 MG tablet Take 1 tablet (20 mg total) by mouth daily.   topiramate  (TOPAMAX ) 25 MG tablet Take 3 tablets (75 mg total) by mouth daily.   gabapentin  (NEURONTIN ) 100 MG capsule Take 1 capsule (100 mg total) by mouth 3 (three) times daily.   lidocaine  (LIDODERM ) 5 % Place 1 patch onto the skin daily. Remove & Discard patch within 12 hours or as directed by MD (Patient not taking: Reported on 08/16/2023)   methylPREDNISolone  (MEDROL  DOSEPAK) 4 MG TBPK tablet Take as directed on package (Patient not taking: Reported on 08/16/2023)   traMADol  (ULTRAM ) 50 MG tablet Take 1 tablet (50 mg total) by mouth every 6 (six) hours as needed. (Patient not taking: Reported on 08/16/2023)   zinc  gluconate 50 MG tablet Take 1 tablet (50 mg total) by mouth daily.  (Patient not taking: Reported on 08/16/2023)   [DISCONTINUED] gabapentin  (NEURONTIN ) 100 MG capsule Take 1 capsule (100 mg total) by mouth 3 (three) times daily. (Patient not taking: Reported on 08/16/2023)   No facility-administered encounter medications on file as of 08/16/2023.    PAST MEDICAL HISTORY: Past Medical History:  Diagnosis Date   Hyperlipidemia    Phreesia 11/12/2019    PAST SURGICAL HISTORY: Past Surgical History:  Procedure Laterality Date   CESAREAN SECTION N/A    Phreesia 11/12/2019   GANGLION CYST EXCISION Bilateral    x2    ALLERGIES: Allergies  Allergen Reactions   Percocet [Oxycodone -Acetaminophen ] Other (See Comments)    Hallucinations, paranoia    FAMILY HISTORY: Family History  Problem Relation Age of Onset   Obesity Father    Cirrhosis Father    Pulmonary embolism Sister    Lupus Brother    Breast cancer Neg Hx  SOCIAL HISTORY: Social History   Tobacco Use   Smoking status: Every Day    Current packs/day: 0.25    Average packs/day: 0.3 packs/day for 15.8 years (4.0 ttl pk-yrs)    Types: Cigarettes    Start date: 10/10/2007    Passive exposure: Never   Smokeless tobacco: Never   Tobacco comments:    6 cigs per day 04/14/21.   Vaping Use   Vaping status: Never Used  Substance Use Topics   Alcohol use: Yes    Comment: 2 drinks per week.   Drug use: Not Currently   Social History   Social History Narrative   Are you right handed or left handed? Right   Are you currently employed ?    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   Who lives with you? husband   What type of home do you live in: 1 story or 2 story? one   Caffeine 1 time a week        Objective:  Vital Signs:  BP 134/88   Pulse 97   Ht 5\' 6"  (1.676 m)   Wt 241 lb (109.3 kg)   SpO2 96%   BMI 38.90 kg/m   General: No acute distress.  Patient appears well-groomed.   Head:  Normocephalic/atraumatic Eyes:  fundi examined  but not visualized Neck: supple Lungs: Non-labored breathing on room air   Neurological Exam: Mental status: alert and oriented, speech fluent and not dysarthric, language intact.  Cranial nerves: CN I: not tested CN II: pupils equal, round and reactive to light, visual fields intact CN III, IV, VI:  full range of motion, no nystagmus, no ptosis CN V: facial sensation intact. CN VII: upper and lower face symmetric CN VIII: hearing intact CN IX, X: uvula midline CN XI: sternocleidomastoid and trapezius muscles intact CN XII: tongue midline  Bulk & Tone: normal, no fasciculations. Motor:  muscle strength 5/5 throughout Deep Tendon Reflexes:  2+ throughout  Sensation:  Pinprick sensation intact. Finger to nose testing:  Without dysmetria.   Gait:  Antaglic gait   Labs and Imaging review: New results: 08/04/23: BMP unremarkable CBC w/ diff significant for WBC 15.1 (neutrophilic predominance) CK: 154  Lumbar spine xray (07/31/23): FINDINGS: No fracture or dislocation. Normal alignment. Mild narrowing of interspaces L2-S1 with small anterior endplate spurs.   IMPRESSION: 1. No acute findings. 2. Mild multilevel degenerative disc disease.  Hip xray (08/04/23): IMPRESSION: 1. Mild degenerative changes at the left knee with no evidence of acute fracture or dislocation. 2. No acute fracture or dislocation at the left hip. 3. Sclerosis at the left sacroiliac joint, compatible with sacroiliitis.   Previously reviewed results:      Lab Results  Component Value Date    HGBA1C 6.1 (H) 09/04/2022      Recent Labs           Lab Results  Component Value Date    VITAMINB12 1,000 10/18/2021      Recent Labs[] Expand by Default           Lab Results  Component Value Date    TSH 1.04 11/21/2022      11/21/22: CMP significant for glucose of 119 CBC w/ diff significant for MCV of 96.9   Vit D (11/03/22): 31 (wnl)   Ferritin (10/18/21): 34   Imaging: MRI lumbar spine wo  contrast (08/11/2013): FINDINGS:  Last fully open disk space is labeled L5-S1. Present examination  incorporates from T11-12 disc space through the lower sacrum.   Conus just below the T12-L1 disc space.   Right ovary 2.4 x 1.8 cm cyst/follicle may be present although  incompletely assessed.   T11-12 through L1-2 unremarkable.   L2-3:  Minimal facet joint degenerative changes.   L3-4: Minimal right-sided and mild left-sided facet joint  degenerative changes.   L4-5:  Minimal facet joint degenerative changes.   L5-S1:  Minimal facet joint degenerative changes.   Decreased signal intensity of bone marrow may be related to  patient's habitus. Correlation with CBC to exclude anemia  contributing to this appearance may be considered   IMPRESSION:  No evidence of lumbar disc herniation, spinal stenosis or foraminal  narrowing.   Minimal to mild facet joint degenerative changes most notable on the  left at the L3-4 level.   Decreased signal intensity of bone marrow may be related to  patient's habitus. Correlation with CBC to exclude anemia  contributing to this appearance may be considered.    EMG (04/08/19): NCV & EMG Findings: 2 electrodiagnostic testing of the left upper extremity shows:  Left median sensory response shows prolonged latency (6.5 ms) and reduced amplitude (7.4 V). Left median motor response shows prolonged latency (5.9 ms).  Left ulnar motor responses within normal limits. There is no evidence of active or chronic motor axonal loss changes affecting any of the tested muscles.  Motor unit configuration and recruitment pattern is within normal limits.   Impression: Left median neuropathy at or distal to the wrist (moderate-to-severe) consistent with a clinical diagnosis of carpal tunnel syndrome   Sleep study result Date of study: 05/11/2021  Impression: Moderately severe obstructive sleep apnea Moderate oxygen desaturations  Recommendation: DME  referral  Recommend CPAP therapy for moderate obstructive sleep apnea  Auto titrating CPAP with pressure settings of 5-20 will be appropriate  Encourage weight loss measures   Assessment/Plan:  This is Diana Garrison, a 53 y.o. female with: Nocturnal headaches - improved with Topamax  75 mg daily. Symptoms may be due to IIH with contributions from untreated OSA. Patient does not want LP. She also has not seen sleep medicine yet, as I recommended. She said she will though.  Left thigh pain - getting work up through ortho and PCP, appears to be back related though patient is not sure. I do not see clear deficits outside of pain on examination today.  Plan: -Continue topamax  75 mg daily -Increase gabapentin  to 400 mg three times daily -MRI lumbar spine as planned -See sleep medicine for untreated OSA  Return to clinic in 6 months  Rommie Coats, MD

## 2023-08-10 ENCOUNTER — Other Ambulatory Visit (HOSPITAL_BASED_OUTPATIENT_CLINIC_OR_DEPARTMENT_OTHER): Payer: Self-pay | Admitting: Physician Assistant

## 2023-08-10 ENCOUNTER — Other Ambulatory Visit (HOSPITAL_COMMUNITY): Payer: Self-pay

## 2023-08-10 DIAGNOSIS — M79652 Pain in left thigh: Secondary | ICD-10-CM

## 2023-08-10 DIAGNOSIS — M545 Low back pain, unspecified: Secondary | ICD-10-CM | POA: Diagnosis not present

## 2023-08-10 DIAGNOSIS — M898X5 Other specified disorders of bone, thigh: Secondary | ICD-10-CM | POA: Diagnosis not present

## 2023-08-10 MED ORDER — GABAPENTIN 300 MG PO CAPS
300.0000 mg | ORAL_CAPSULE | Freq: Three times a day (TID) | ORAL | 0 refills | Status: DC
  Filled 2023-08-10: qty 90, 30d supply, fill #0

## 2023-08-13 ENCOUNTER — Other Ambulatory Visit: Payer: Self-pay | Admitting: Family Medicine

## 2023-08-13 DIAGNOSIS — Z1231 Encounter for screening mammogram for malignant neoplasm of breast: Secondary | ICD-10-CM

## 2023-08-15 ENCOUNTER — Other Ambulatory Visit (HOSPITAL_COMMUNITY): Payer: Self-pay

## 2023-08-16 ENCOUNTER — Ambulatory Visit (INDEPENDENT_AMBULATORY_CARE_PROVIDER_SITE_OTHER): Payer: 59 | Admitting: Neurology

## 2023-08-16 ENCOUNTER — Ambulatory Visit
Admission: RE | Admit: 2023-08-16 | Discharge: 2023-08-16 | Discharge status: Home / Self Care | Source: Ambulatory Visit | Attending: Family Medicine

## 2023-08-16 ENCOUNTER — Encounter: Payer: Self-pay | Admitting: Neurology

## 2023-08-16 ENCOUNTER — Other Ambulatory Visit (HOSPITAL_COMMUNITY): Payer: Self-pay

## 2023-08-16 VITALS — BP 134/88 | HR 97 | Ht 66.0 in | Wt 241.0 lb

## 2023-08-16 DIAGNOSIS — R51 Headache with orthostatic component, not elsewhere classified: Secondary | ICD-10-CM | POA: Diagnosis not present

## 2023-08-16 DIAGNOSIS — M79605 Pain in left leg: Secondary | ICD-10-CM | POA: Diagnosis not present

## 2023-08-16 DIAGNOSIS — G4733 Obstructive sleep apnea (adult) (pediatric): Secondary | ICD-10-CM | POA: Diagnosis not present

## 2023-08-16 DIAGNOSIS — G932 Benign intracranial hypertension: Secondary | ICD-10-CM

## 2023-08-16 DIAGNOSIS — Z1231 Encounter for screening mammogram for malignant neoplasm of breast: Secondary | ICD-10-CM | POA: Diagnosis not present

## 2023-08-16 MED ORDER — GABAPENTIN 100 MG PO CAPS
100.0000 mg | ORAL_CAPSULE | Freq: Three times a day (TID) | ORAL | 0 refills | Status: DC
  Filled 2023-08-16 – 2023-08-29 (×2): qty 60, 20d supply, fill #0

## 2023-08-16 NOTE — Patient Instructions (Signed)
 I'm glad your headaches have improved. Continue Topamax  75 mg daily for this.  I will increase your gabapentin  to 400 mg three times a day to see if this helps further with the right leg pain. You will take the 100 mg tablet with the 300 mg tablets you already have. Let me know if you have trouble getting the medication.  Get the MRI of your back as planned.  I will see you back in clinic for your headaches in about 6 months.  Please let me know if you have any questions or concerns in the meantime.  The physicians and staff at [facility] are committed to providing excellent care. You may receive a survey requesting feedback about your experience at our office. We strive to receive "very good" responses to the survey questions. If you feel that your experience would prevent you from giving the office a "very good " response, please contact our office to try to remedy the situation. We may be reached at [phone]. Thank you for taking the time out of your busy day to complete the survey.  Rommie Coats, MD [facility]

## 2023-08-19 ENCOUNTER — Ambulatory Visit (HOSPITAL_BASED_OUTPATIENT_CLINIC_OR_DEPARTMENT_OTHER)
Admission: RE | Admit: 2023-08-19 | Discharge: 2023-08-19 | Disposition: A | Discharge status: Home / Self Care | Source: Ambulatory Visit | Attending: Physician Assistant | Admitting: Physician Assistant

## 2023-08-19 ENCOUNTER — Ambulatory Visit (HOSPITAL_BASED_OUTPATIENT_CLINIC_OR_DEPARTMENT_OTHER)
Admission: RE | Admit: 2023-08-19 | Discharge: 2023-08-19 | Disposition: A | Discharge status: Home / Self Care | Source: Ambulatory Visit | Attending: Family Medicine | Admitting: Family Medicine

## 2023-08-19 DIAGNOSIS — M79652 Pain in left thigh: Secondary | ICD-10-CM | POA: Diagnosis not present

## 2023-08-19 DIAGNOSIS — M48061 Spinal stenosis, lumbar region without neurogenic claudication: Secondary | ICD-10-CM | POA: Diagnosis not present

## 2023-08-19 DIAGNOSIS — M5136 Other intervertebral disc degeneration, lumbar region with discogenic back pain only: Secondary | ICD-10-CM | POA: Diagnosis not present

## 2023-08-19 DIAGNOSIS — M5416 Radiculopathy, lumbar region: Secondary | ICD-10-CM | POA: Diagnosis not present

## 2023-08-19 DIAGNOSIS — M4807 Spinal stenosis, lumbosacral region: Secondary | ICD-10-CM | POA: Diagnosis not present

## 2023-08-19 DIAGNOSIS — M47816 Spondylosis without myelopathy or radiculopathy, lumbar region: Secondary | ICD-10-CM | POA: Diagnosis not present

## 2023-08-23 ENCOUNTER — Encounter: Payer: Self-pay | Admitting: Family Medicine

## 2023-08-29 ENCOUNTER — Telehealth: Payer: Self-pay

## 2023-08-29 ENCOUNTER — Other Ambulatory Visit (HOSPITAL_COMMUNITY): Payer: Self-pay

## 2023-08-29 ENCOUNTER — Other Ambulatory Visit: Payer: Self-pay

## 2023-08-29 NOTE — Telephone Encounter (Signed)
 Copied from CRM [id number]. Topic: Clinical - Request for Lab/Test Order >> Aug 29, 2023  2:33 PM Dewanda Foots wrote: Reason for CRM: Pt would like someone to please call her to go over the results for her MRI on her back done on 08/19/2023. She also called on 6/16 for this reason and would like a callback as soon as possible please.  Pt is upset it is taking this long to receive a response.  Pt callback is [phone], Please call her as soon as possible.

## 2023-08-29 NOTE — Telephone Encounter (Signed)
 Called [facility] and was given the number to Reading room to call and have the images pushed to be read. Dm/cma

## 2023-08-29 NOTE — Telephone Encounter (Signed)
Left VM to rtn call. Dm/cma       

## 2023-08-30 ENCOUNTER — Other Ambulatory Visit (HOSPITAL_COMMUNITY): Payer: Self-pay

## 2023-08-31 ENCOUNTER — Ambulatory Visit (HOSPITAL_COMMUNITY)
Admission: RE | Admit: 2023-08-31 | Discharge: 2023-08-31 | Disposition: A | Discharge status: Home / Self Care | Source: Ambulatory Visit | Attending: Physician Assistant | Admitting: Physician Assistant

## 2023-08-31 DIAGNOSIS — R600 Localized edema: Secondary | ICD-10-CM | POA: Diagnosis not present

## 2023-08-31 DIAGNOSIS — M79652 Pain in left thigh: Secondary | ICD-10-CM | POA: Diagnosis not present

## 2023-09-02 ENCOUNTER — Other Ambulatory Visit (HOSPITAL_COMMUNITY): Payer: Self-pay

## 2023-09-03 ENCOUNTER — Encounter: Payer: Self-pay | Admitting: Family Medicine

## 2023-09-03 ENCOUNTER — Telehealth: Payer: Self-pay

## 2023-09-03 NOTE — Telephone Encounter (Signed)
 Called patient and spoke to her, I do know that she seen the MRI results in the chart. I advised that she needs to call the provider that ordered the test to have them review and advise.   She got upset with me and hung up due to not liking the answer.  She does have an upcoming appointment on 09/07/23 to discuss this and FMLA paperwork with Dr Thedora. Dm/cma

## 2023-09-03 NOTE — Telephone Encounter (Signed)
 Copied from CRM [id number]. Topic: Clinical - Lab/Test Results >> Sep 03, 2023 11:41 AM [place] J wrote: Reason for CRM: Patient would like a call back from a nurse with her imaging results.

## 2023-09-04 ENCOUNTER — Ambulatory Visit: Payer: Self-pay | Admitting: Family Medicine

## 2023-09-04 ENCOUNTER — Other Ambulatory Visit (HOSPITAL_COMMUNITY): Payer: Self-pay

## 2023-09-04 MED ORDER — DOXYCYCLINE HYCLATE 100 MG PO TABS
100.0000 mg | ORAL_TABLET | Freq: Two times a day (BID) | ORAL | 0 refills | Status: DC
  Filled 2023-09-04: qty 10, 5d supply, fill #0

## 2023-09-06 ENCOUNTER — Other Ambulatory Visit (HOSPITAL_COMMUNITY): Payer: Self-pay

## 2023-09-06 MED ORDER — MEDROXYPROGESTERONE ACETATE 5 MG PO TABS
5.0000 mg | ORAL_TABLET | Freq: Every day | ORAL | 2 refills | Status: DC
  Filled 2023-09-06: qty 30, 30d supply, fill #0
  Filled 2023-11-01: qty 30, 30d supply, fill #1
  Filled 2023-12-10: qty 30, 30d supply, fill #2

## 2023-09-07 ENCOUNTER — Other Ambulatory Visit: Payer: Self-pay

## 2023-09-07 ENCOUNTER — Other Ambulatory Visit (HOSPITAL_COMMUNITY): Payer: Self-pay

## 2023-09-07 ENCOUNTER — Ambulatory Visit: Admitting: Family Medicine

## 2023-09-07 ENCOUNTER — Encounter: Payer: Self-pay | Admitting: Family Medicine

## 2023-09-07 VITALS — BP 124/78 | HR 85 | Temp 97.6°F | Ht 66.0 in | Wt 346.2 lb

## 2023-09-07 DIAGNOSIS — S76102A Unspecified injury of left quadriceps muscle, fascia and tendon, initial encounter: Secondary | ICD-10-CM | POA: Diagnosis not present

## 2023-09-07 MED ORDER — TRAMADOL HCL 50 MG PO TABS
50.0000 mg | ORAL_TABLET | Freq: Every evening | ORAL | 0 refills | Status: AC | PRN
  Filled 2023-09-07: qty 15, 15d supply, fill #0

## 2023-09-07 MED ORDER — NAPROXEN 500 MG PO TABS
500.0000 mg | ORAL_TABLET | Freq: Two times a day (BID) | ORAL | 0 refills | Status: DC
  Filled 2023-09-07: qty 28, 14d supply, fill #0

## 2023-09-07 MED ORDER — GABAPENTIN 300 MG PO CAPS
300.0000 mg | ORAL_CAPSULE | Freq: Three times a day (TID) | ORAL | 0 refills | Status: AC
  Filled 2023-09-07: qty 90, 30d supply, fill #0

## 2023-09-07 NOTE — Progress Notes (Signed)
 [facility] [address] [place] [address] Dept: [phone] Dept Fax: [phone]  Office Visit  Subjective:    Patient ID: Diana Garrison, female    DOB: June 04, 1970, 53 y.o..   MRN: [id number]  Chief Complaint  Patient presents with   Follow-up    Follow up to discuss FMLA paperwork.     History of Present Illness:  Patient is in today with ongoing pain in her left thigh. Ms. Rovira notes she was on a plane flight on 5/13. She felt the seat was small. On 5/15, she attended a show at the [facility]. She also felt that seat was quite small. By 5/17, she was noting pain in her anterior left thigh, which gave her difficulty with standing and ambulating. She missed wok most days between 5/19-5/27. She went to [employer] on 5/20. Ms. Lorio maintains that she has not been experiencing back pain. However, the ED note speaks of back pain and radiation to the leg. They performed lumbar spine x-rays. They diagnosed this as a lumbar radiculopathy, gave her a steroid injection and started her on a Medrol  dosepak and provided tramadol . Ms. Phagan was seen in Urgent Care on 5/22, as the steroid was not helping her. They provided a Toradol  injection. Ms. No was seen by Dr. Sebastian on 5/23. He ordered a MRI scan of her lumbar spine, referred her to orthopedics, and gave her another Toradol  injection. Ms. Kleppe was seen at [facility] on 5/24. She had hip and knee x-rays. Gabapentin  was added to her regimen. Ms Arauz was seen by orthopedics. She notes they added an MRI of the femur to the original MRI request.  Ms. Tellefsen feels very frustrated that she continues to have left thigh pain. She feels none of the doctors have listened to her and that nothing is being done to resolve her issue. She continues to have pain int he mid anterior thigh. She finds that when she sits too long, this stiffens up. She also notes she has trouble  supporting her weight. She missed about 8 days of work in May and is worried about additional absences. She has been trying to stay at work throughout the day. Her managers have allowed her to sit more to accommodate her issue. She is seeking answers to resolve the concern.  Past Medical History: Patient Active Problem List   Diagnosis Date Noted   Thickened endometrium 12/14/2022   Postmenopausal vaginal bleeding 11/21/2022   Positional headache 11/03/2022   Decreased libido 09/20/2022   OSA on CPAP 06/27/2022   Polyphagia 05/16/2022   Morbid obesity (HCC) with starting BMI 54 01/03/2022   Pre-diabetes 12/05/2021   Other hyperlipidemia 11/01/2021   Depression 11/01/2021   Seasonal allergic rhinitis 10/26/2021   Hyperlipidemia 10/26/2021   Vitamin D  insufficiency 10/20/2021   Moderate episode of recurrent major depressive disorder (HCC) 08/16/2021   Right knee meniscal tear 08/16/2021   Spider veins of both lower extremities 10/17/2016   Tobacco use disorder 06/06/2013   Past Surgical History:  Procedure Laterality Date   CESAREAN SECTION N/A    [employer]reesia 11/12/2019   GANGLION CYST EXCISION Bilateral    x2   Family History  Problem Relation Age of Onset   Obesity Father    Cirrhosis Father    Pulmonary embolism Sister    Lupus Brother    Breast cancer Neg Hx    Outpatient Medications Prior to Visit  Medication Sig Dispense Refill  acetaminophen  (TYLENOL ) 500 MG tablet Take 1 tablet (500 mg total) by mouth every 6 (six) hours as needed. 30 tablet 0   doxycycline  (VIBRA -TABS) 100 MG tablet Take 1 tablet (100 mg total) by mouth every 12 (twelve) hours. 10 tablet 0   gabapentin  (NEURONTIN ) 100 MG capsule Take 1 capsule (100 mg total) by mouth 3 (three) times daily. 60 capsule 0   gabapentin  (NEURONTIN ) 300 MG capsule Take 1 capsule (300 mg total) by mouth 3 (three) times daily. 90 capsule 0   magnesium  30 MG tablet Take 1 tablet (30 mg total) by mouth 2 (two) times daily. 60  tablet 0   medroxyPROGESTERone  (PROVERA ) 5 MG tablet Take 1 tablet (5 mg total) by mouth daily. 30 tablet 2   methocarbamol  (ROBAXIN ) 500 MG tablet Take 1 tablet (500 mg total) by mouth 4 (four) times daily. 60 tablet 1   Multiple Vitamin (MULTIVITAMIN) tablet Take 1 tablet by mouth daily.     rosuvastatin  (CRESTOR ) 20 MG tablet Take 1 tablet (20 mg total) by mouth daily. 90 tablet 3   topiramate  (TOPAMAX ) 25 MG tablet Take 3 tablets (75 mg total) by mouth daily. 90 tablet 5   zinc  gluconate 50 MG tablet Take 1 tablet (50 mg total) by mouth daily. 30 tablet 0   cyclobenzaprine  (FLEXERIL ) 10 MG tablet Take 1 tablet (10 mg total) by mouth 3 (three) times daily as needed for muscle spasms. (Patient not taking: Reported on 09/07/2023) 30 tablet 1   methylPREDNISolone  (MEDROL  DOSEPAK) 4 MG TBPK tablet Take as directed on package (Patient not taking: Reported on 08/16/2023) 21 tablet 0   lidocaine  (LIDODERM ) 5 % Place 1 patch onto the skin daily. Remove & Discard patch within 12 hours or as directed by MD (Patient not taking: Reported on 08/16/2023) 30 patch 0   traMADol  (ULTRAM ) 50 MG tablet Take 1 tablet (50 mg total) by mouth every 6 (six) hours as needed. (Patient not taking: Reported on 08/16/2023) 15 tablet 0   No facility-administered medications prior to visit.   Allergies  Allergen Reactions   Percocet [Oxycodone -Acetaminophen ] Other (See Comments)    Hallucinations, paranoia     Objective:   Today's Vitals   09/07/23 0915  BP: 124/78  Pulse: 85  Temp: 97.6 F (36.4 C)  TempSrc: Temporal  SpO2: 96%  Weight: (!) 346 lb 3.2 oz (157 kg)  Height: 5' 6 (1.676 m)   Body mass index is 55.88 kg/m.   General: Well developed, well nourished. No acute distress. Extremities: The skin of the left thigh appears normal. There is pain with deeper palpation over the mis   quadriceps. There is no increased pain with resisted extension. However there is pain when standing and   trying to walk, but  eases after a bit. Psych: Alert and oriented. Normal mood and affect.  Health Maintenance Due  Topic Date Due   Pneumococcal Vaccine 66-65 Years old (2 of 2 - PCV) 04/19/2017   Zoster Vaccines- Shingrix (1 of 2) Never done   Imaging: Lumbar spine x-ray (07/31/2023) IMPRESSION: 1. No acute findings. 2. Mild multilevel degenerative disc disease.  Left Hip x-ray and Left Knee  x-ray (08/04/2023) IMPRESSION: 1. Mild degenerative changes at the left knee with no evidence of acute fracture or dislocation. 2. No acute fracture or dislocation at the left hip. 3. Sclerosis at the left sacroiliac joint, compatible with sacroiliitis.  LE Venous Ultrasound (08/04/2023) Summary:  RIGHT:  - No evidence of common femoral vein obstruction.  LEFT:  - There is no evidence of deep vein thrombosis in the lower extremity.  - No cystic structure found in the popliteal fossa.   MR Femur Left wo contrast (08/31/2023) IMPRESSION: Mild focal deep subcutaneous edema abutting the lateral fascia of the vastus lateralis. Correlate for any unreported trauma versus mild focal cellulitis.   The exam is otherwise unremarkable.  MR Lumbar Spine wo contrast (08/31/2023) IMPRESSION: Multilevel degenerative spondylosis with levels described in detail above.   L2-3 has a small left foraminal protrusion contributing to mild-to-moderate foraminal narrowing. There is also a very small right paracentral disc extrusion migrating inferiorly causing mild lateral recess narrowing on the right. No impingement.   L3-4 has a small right foraminal protrusion causing mild foraminal narrowing.    Assessment & Plan:   Problem List Items Addressed This Visit       Musculoskeletal and Integument   Injury of left quadriceps femoris muscle - Primary   I have reviewed the multiple visits Ms. Laneve has had and the multiple x-rays. This and her exam today, suggests more of a quadriceps injury/inflammation. She was given a  prescription for an antibiotic, since the MRI report mention the findings could represent cellulitis, but she has no other findings to suggest this. I recommend we switch her to naproxen  500 mg bid for 14 days. I will provide tramadol  for use at night to try and help her sleep. She can continue a muscle relaxer and the gabapentin  for now. I will refer her to physical therapy to see if we can get this worked out. iw ill see her back in 2 weeks.      Relevant Medications   naproxen  (NAPROSYN ) 500 MG tablet   traMADol  (ULTRAM ) 50 MG tablet   Other Relevant Orders   Ambulatory referral to Physical Therapy    Return in about 19 days (around 09/26/2023) for Reassessment.   Garnette CHRISTELLA Simpler, MD

## 2023-09-07 NOTE — Assessment & Plan Note (Addendum)
 I have reviewed the multiple visits Diana Garrison has had and the multiple x-rays. This and her exam today, suggests more of a quadriceps injury/inflammation. She was given a prescription for an antibiotic, since the MRI report mention the findings could represent cellulitis, but she has no other findings to suggest this. I recommend we switch her to naproxen  500 mg bid for 14 days. I will provide tramadol  for use at night to try and help her sleep. She can continue a muscle relaxer and the gabapentin  for now. I will refer her to physical therapy to see if we can get this worked out. iw ill see her back in 2 weeks. I will complete FMLA forms as well.

## 2023-09-11 NOTE — Telephone Encounter (Signed)
 Discussed at office appointment with Dr. Thedora

## 2023-09-13 ENCOUNTER — Other Ambulatory Visit: Payer: Self-pay | Admitting: Neurology

## 2023-09-13 DIAGNOSIS — M79605 Pain in left leg: Secondary | ICD-10-CM

## 2023-09-15 ENCOUNTER — Other Ambulatory Visit (HOSPITAL_COMMUNITY): Payer: Self-pay

## 2023-09-17 ENCOUNTER — Other Ambulatory Visit: Payer: Self-pay

## 2023-09-18 ENCOUNTER — Other Ambulatory Visit (HOSPITAL_COMMUNITY): Payer: Self-pay

## 2023-09-18 MED ORDER — GABAPENTIN 100 MG PO CAPS
100.0000 mg | ORAL_CAPSULE | Freq: Three times a day (TID) | ORAL | 0 refills | Status: DC
  Filled 2023-09-18: qty 60, 20d supply, fill #0

## 2023-09-21 ENCOUNTER — Encounter: Payer: Self-pay | Admitting: Family Medicine

## 2023-09-21 ENCOUNTER — Ambulatory Visit: Payer: Self-pay | Admitting: Family Medicine

## 2023-09-21 ENCOUNTER — Ambulatory Visit (INDEPENDENT_AMBULATORY_CARE_PROVIDER_SITE_OTHER): Admitting: Family Medicine

## 2023-09-21 VITALS — BP 132/84 | HR 87 | Temp 97.5°F | Ht 66.0 in | Wt 345.2 lb

## 2023-09-21 DIAGNOSIS — R7303 Prediabetes: Secondary | ICD-10-CM

## 2023-09-21 DIAGNOSIS — Z6841 Body Mass Index (BMI) 40.0 and over, adult: Secondary | ICD-10-CM

## 2023-09-21 DIAGNOSIS — Z0000 Encounter for general adult medical examination without abnormal findings: Secondary | ICD-10-CM | POA: Diagnosis not present

## 2023-09-21 DIAGNOSIS — E782 Mixed hyperlipidemia: Secondary | ICD-10-CM | POA: Diagnosis not present

## 2023-09-21 DIAGNOSIS — E559 Vitamin D deficiency, unspecified: Secondary | ICD-10-CM

## 2023-09-21 DIAGNOSIS — M25562 Pain in left knee: Secondary | ICD-10-CM

## 2023-09-21 DIAGNOSIS — S76102A Unspecified injury of left quadriceps muscle, fascia and tendon, initial encounter: Secondary | ICD-10-CM

## 2023-09-21 LAB — LIPID PANEL
Cholesterol: 172 mg/dL (ref 0–200)
HDL: 39.6 mg/dL (ref >39.00)
LDL Cholesterol: 101 mg/dL — ABNORMAL HIGH (ref 0–99)
NonHDL: 131.92
Total CHOL/HDL Ratio: 4
Triglycerides: 155 mg/dL — ABNORMAL HIGH (ref 0.0–149.0)
VLDL: 31 mg/dL (ref 0.0–40.0)

## 2023-09-21 LAB — VITAMIN D 25 HYDROXY (VIT D DEFICIENCY, FRACTURES): VITD: 17.95 ng/mL — ABNORMAL LOW (ref 30.00–100.00)

## 2023-09-21 LAB — HEMOGLOBIN A1C: Hgb A1c MFr Bld: 6.4 % (ref 4.6–6.5)

## 2023-09-21 MED ORDER — VITAMIN D (ERGOCALCIFEROL) 1.25 MG (50000 UNIT) PO CAPS
50000.0000 [IU] | ORAL_CAPSULE | ORAL | 0 refills | Status: AC
Start: 2023-09-21 — End: ?
  Filled 2023-09-21 – 2023-09-22 (×2): qty 12, 84d supply, fill #0

## 2023-09-21 MED ORDER — NAPROXEN 500 MG PO TABS
500.0000 mg | ORAL_TABLET | Freq: Two times a day (BID) | ORAL | 0 refills | Status: DC
  Filled 2023-09-21 – 2023-09-22 (×2): qty 28, 14d supply, fill #0

## 2023-09-21 NOTE — Assessment & Plan Note (Signed)
 I will check a Vitamin D  level.

## 2023-09-21 NOTE — Assessment & Plan Note (Signed)
 Resolved

## 2023-09-21 NOTE — Progress Notes (Signed)
 [facility] [address] [place] [address] Dept: [phone] Dept Fax: [phone]  Chronic Care Office Visit  Subjective:    Patient ID: Diana Garrison, female    DOB: Aug 27, 1970, 53 y.o..   MRN: [id number]  Chief Complaint  Patient presents with   Annual Exam    CPE/labs.   C/o having LT knee pain.  Amon in the house yesterday)   History of Present Illness:  Patient is in today for reassessment of chronic medical issues.  Diana Garrison has a history of obesity. She had been being [facility]. She was managed initially with Qsymia . She was tried on metformin , but had significant GI effects. She was not able to achieve significant weight loss. She notes health issues related to ehr weight, including some increased work of breathing/breathlessness and lower extremity joint issues.   Diana Garrison has a history of hyperlipidemia. She is managed on rosuvastatin  20 mg daily.  Review of Systems  Constitutional:  Negative for chills, diaphoresis, fever, malaise/fatigue and weight loss.  HENT:  Negative for congestion, ear pain, hearing loss, sinus pain, sore throat and tinnitus.   Eyes:  Negative for blurred vision, pain, discharge and redness.  Respiratory:  Negative for cough, shortness of breath and wheezing.   Cardiovascular:  Negative for chest pain and palpitations.  Gastrointestinal:  Negative for abdominal pain, constipation, diarrhea, heartburn, nausea and vomiting.  Musculoskeletal:  Positive for joint pain and myalgias. Negative for back pain.       Diana Garrison had been seen recently with a left quadriceps injury. She notes this has now resolved.  Diana Garrison notes she had a fall several days ago. She is having pain in the left knee. She finds certain movements increase her pain. Most of the pain is along the medial aspect of the knee.  Skin:  Negative for itching and rash.   Psychiatric/Behavioral:  Negative for depression. The patient is not nervous/anxious.    Past Medical History: Patient Active Problem List   Diagnosis Date Noted   Injury of left quadriceps femoris muscle 09/07/2023   Thickened endometrium 12/14/2022   Postmenopausal vaginal bleeding 11/21/2022   Positional headache 11/03/2022   Decreased libido 09/20/2022   OSA on CPAP 06/27/2022   Polyphagia 05/16/2022   Morbid obesity with BMI of 50.0-59.9, adult (HCC) 01/03/2022   Pre-diabetes 12/05/2021   Depression 11/01/2021   Seasonal allergic rhinitis 10/26/2021   Hyperlipidemia 10/26/2021   Vitamin D  insufficiency 10/20/2021   Moderate episode of recurrent major depressive disorder (HCC) 08/16/2021   Right knee meniscal tear 08/16/2021   Spider veins of both lower extremities 10/17/2016   Tobacco use disorder 06/06/2013   Past Surgical History:  Procedure Laterality Date   CESAREAN SECTION N/A    [employer] 11/12/2019   GANGLION CYST EXCISION Bilateral    x2   Family History  Problem Relation Age of Onset   Obesity Father    Cirrhosis Father    Pulmonary embolism Sister    Lupus Brother    Breast cancer Neg Hx    Outpatient Medications Prior to Visit  Medication Sig Dispense Refill   acetaminophen  (TYLENOL ) 500 MG tablet Take 1 tablet (500 mg total) by mouth every 6 (six) hours as needed. 30 tablet 0   doxycycline  (VIBRA -TABS) 100 MG tablet Take 1 tablet (100 mg total) by mouth every 12 (twelve) hours. 10 tablet 0   gabapentin  (NEURONTIN ) 100 MG capsule Take 1 capsule (  100 mg total) by mouth 3 (three) times daily. 60 capsule 0   gabapentin  (NEURONTIN ) 300 MG capsule Take 1 capsule (300 mg total) by mouth 3 (three) times daily. 90 capsule 0   magnesium  30 MG tablet Take 1 tablet (30 mg total) by mouth 2 (two) times daily. 60 tablet 0   medroxyPROGESTERone  (PROVERA ) 5 MG tablet Take 1 tablet (5 mg total) by mouth daily. 30 tablet 2   methocarbamol  (ROBAXIN ) 500 MG tablet Take 1  tablet (500 mg total) by mouth 4 (four) times daily. 60 tablet 1   Multiple Vitamin (MULTIVITAMIN) tablet Take 1 tablet by mouth daily.     naproxen  (NAPROSYN ) 500 MG tablet Take 1 tablet (500 mg total) by mouth 2 (two) times daily with a meal. 28 tablet 0   rosuvastatin  (CRESTOR ) 20 MG tablet Take 1 tablet (20 mg total) by mouth daily. 90 tablet 3   topiramate  (TOPAMAX ) 25 MG tablet Take 3 tablets (75 mg total) by mouth daily. 90 tablet 5   traMADol  (ULTRAM ) 50 MG tablet Take 1 tablet (50 mg total) by mouth at bedtime as needed for up to 5 days for moderate pain (pain score 4-6). 15 tablet 0   zinc  gluconate 50 MG tablet Take 1 tablet (50 mg total) by mouth daily. 30 tablet 0   No facility-administered medications prior to visit.   Allergies  Allergen Reactions   Percocet [Oxycodone -Acetaminophen ] Other (See Comments)    Hallucinations, paranoia   Objective:   Today's Vitals   09/21/23 1311  BP: 132/84  Pulse: 87  Temp: (!) 97.5 F (36.4 C)  TempSrc: Temporal  SpO2: 95%  Weight: (!) 345 lb 3.2 oz (156.6 kg)  Height: 5' 6 (1.676 m)   Body mass index is 55.72 kg/m.   General: Obese. Well developed, well nourished. No acute distress. HEENT: Normocephalic, non-traumatic. External ears normal. EAC and TMs normal bilaterally. PERRL, EOMI. Conjunctiva clear.   Nose clear without congestion or rhinorrhea. Mucous membranes moist. Oropharynx clear. Good dentition. Neck: Supple. No lymphadenopathy. No thyromegaly. Lungs: Clear to auscultation bilaterally. No wheezing, rales or rhonchi. CV: RRR without murmurs or rubs. Pulses 2+ bilaterally. Abdomen: Soft, non-tender. Bowel sounds positive, normal pitch and frequency. No hepatosplenomegaly. No rebound or guarding. Extremities: The left knee has mildly increased warmth. No joint swelling. There is pain with a modified McMurray's while sitting.   No edema noted. Skin: Warm and dry. No rashes. Psych: Alert and oriented. Normal mood and  affect.  Health Maintenance Due  Topic Date Due   Pneumococcal Vaccine 93-44 Years old (2 of 2 - PCV) 04/19/2017   Zoster Vaccines- Shingrix (1 of 2) Never done     Assessment & Plan:   Problem List Items Addressed This Visit       Musculoskeletal and Integument   Injury of left quadriceps femoris muscle   Resolved.        Other   Annual physical exam - Primary   Overall health is fair. Discussed recommended screenings and immunizations.       Hyperlipidemia   I will reassess lipids. Continue rosuvastatin  20 mg daily.      Relevant Orders   Lipid panel (Completed)   Morbid obesity with BMI of 50.0-59.9, adult (HCC)   Discussed principles of weight management, including a foundation of a healthy, calorie-controlled diet and regular exercise. I recommend she try and exercise, though recognize limitations due to recent knee injury. She is looking at chair exercises. We did discuss  the role of medication to augment dietary and exercise efforts. We discussed various options. She will look into [employer] to start on tirzepatide (Zepbound).       Pre-diabetes   I will reassess an A1c and blood sugar.      Relevant Orders   Lipid panel (Completed)   Hemoglobin A1c (Completed)   Vitamin D  insufficiency   I will check a Vitamin D  level.      Relevant Orders   VITAMIN D  25 Hydroxy (Vit-D Deficiency, Fractures) (Completed)   Other Visit Diagnoses       Acute pain of left knee       There may be a cartilage injury. She has an appointment with [facility] on 7/27. I will continue naproxen  for now.   Relevant Medications   naproxen  (NAPROSYN ) 500 MG tablet       Return in about 2 months (around 11/22/2023) for Reassessment.   Garnette CHRISTELLA Simpler, MD

## 2023-09-21 NOTE — Assessment & Plan Note (Signed)
 Discussed principles of weight management, including a foundation of a healthy, calorie-controlled diet and regular exercise. I recommend she try and exercise, though recognize limitations due to recent knee injury. She is looking at chair exercises. We did discuss the role of medication to augment dietary and exercise efforts. We discussed various options. She will look into [employer] to start on tirzepatide (Zepbound).

## 2023-09-21 NOTE — Assessment & Plan Note (Signed)
 I will reassess lipids. Continue rosuvastatin  20 mg daily.

## 2023-09-21 NOTE — Assessment & Plan Note (Signed)
 I will reassess an A1c and blood sugar.

## 2023-09-21 NOTE — Assessment & Plan Note (Signed)
 Overall health is fair. Discussed recommended screenings and immunizations.

## 2023-09-22 ENCOUNTER — Other Ambulatory Visit (HOSPITAL_COMMUNITY): Payer: Self-pay

## 2023-09-22 ENCOUNTER — Encounter: Payer: Self-pay | Admitting: Family Medicine

## 2023-09-22 DIAGNOSIS — E88819 Insulin resistance, unspecified: Secondary | ICD-10-CM

## 2023-09-22 DIAGNOSIS — R7303 Prediabetes: Secondary | ICD-10-CM

## 2023-09-24 ENCOUNTER — Other Ambulatory Visit (HOSPITAL_COMMUNITY): Payer: Self-pay

## 2023-09-24 ENCOUNTER — Encounter: Admitting: Family Medicine

## 2023-09-24 ENCOUNTER — Encounter (HOSPITAL_COMMUNITY): Payer: Self-pay

## 2023-09-24 DIAGNOSIS — E88819 Insulin resistance, unspecified: Secondary | ICD-10-CM | POA: Insufficient documentation

## 2023-09-24 MED ORDER — WEGOVY 0.5 MG/0.5ML ~~LOC~~ SOAJ
0.5000 mg | SUBCUTANEOUS | 0 refills | Status: DC
  Filled 2023-09-24: qty 2, 28d supply, fill #0

## 2023-09-24 MED ORDER — WEGOVY 0.25 MG/0.5ML ~~LOC~~ SOAJ
0.2500 mg | SUBCUTANEOUS | 0 refills | Status: DC
  Filled 2023-09-24: qty 2, 28d supply, fill #0

## 2023-09-24 NOTE — Telephone Encounter (Signed)
Please review and advise. Thanks. Dm/cma  

## 2023-09-25 ENCOUNTER — Other Ambulatory Visit (HOSPITAL_COMMUNITY): Payer: Self-pay

## 2023-10-02 DIAGNOSIS — M25562 Pain in left knee: Secondary | ICD-10-CM | POA: Diagnosis not present

## 2023-10-03 ENCOUNTER — Ambulatory Visit: Attending: Family Medicine | Admitting: Physical Therapy

## 2023-10-03 ENCOUNTER — Other Ambulatory Visit: Payer: Self-pay

## 2023-10-03 ENCOUNTER — Encounter: Payer: Self-pay | Admitting: Physical Therapy

## 2023-10-03 DIAGNOSIS — M79605 Pain in left leg: Secondary | ICD-10-CM | POA: Diagnosis not present

## 2023-10-03 DIAGNOSIS — M6281 Muscle weakness (generalized): Secondary | ICD-10-CM | POA: Insufficient documentation

## 2023-10-03 DIAGNOSIS — M25562 Pain in left knee: Secondary | ICD-10-CM | POA: Insufficient documentation

## 2023-10-03 DIAGNOSIS — R2681 Unsteadiness on feet: Secondary | ICD-10-CM | POA: Insufficient documentation

## 2023-10-03 DIAGNOSIS — R262 Difficulty in walking, not elsewhere classified: Secondary | ICD-10-CM | POA: Insufficient documentation

## 2023-10-03 DIAGNOSIS — S76102A Unspecified injury of left quadriceps muscle, fascia and tendon, initial encounter: Secondary | ICD-10-CM | POA: Diagnosis not present

## 2023-10-03 DIAGNOSIS — G8929 Other chronic pain: Secondary | ICD-10-CM | POA: Insufficient documentation

## 2023-10-03 NOTE — Therapy (Signed)
 OUTPATIENT PHYSICAL THERAPY LOWER EXTREMITY EVALUATION   Patient Name: Diana Garrison MRN: [id number] DOB:1971-01-28, 53 y.o., female Today's Date: 10/03/2023  END OF SESSION:  PT End of Session - 10/03/23 0941     Visit Number 1    Number of Visits 17    Date for PT Re-Evaluation 11/28/23    Authorization Type [employer]    Authorization Time Period 10/03/23 to 11/28/23    PT Start Time 0940   late arrival   PT Stop Time 1003   session limited due to cortisone shot at ortho yesterday   PT Time Calculation (min) 23 min    Activity Tolerance Patient tolerated treatment well    Behavior During Therapy [facility] for tasks assessed/performed          Past Medical History:  Diagnosis Date   Depression 08/03/2023   Hyperlipidemia    Phreesia 11/12/2019   Sleep apnea 2023   Past Surgical History:  Procedure Laterality Date   CESAREAN SECTION N/A    Phreesia 11/12/2019   GANGLION CYST EXCISION Bilateral    x2   Patient Active Problem List   Diagnosis Date Noted   Insulin  resistance 09/24/2023   Annual physical exam 09/21/2023   Injury of left quadriceps femoris muscle 09/07/2023   Thickened endometrium 12/14/2022   Postmenopausal vaginal bleeding 11/21/2022   Positional headache 11/03/2022   Decreased libido 09/20/2022   OSA on CPAP 06/27/2022   Polyphagia 05/16/2022   Morbid obesity with BMI of 50.0-59.9, adult (HCC) 01/03/2022   Pre-diabetes 12/05/2021   Depression 11/01/2021   Seasonal allergic rhinitis 10/26/2021   Hyperlipidemia 10/26/2021   Vitamin D  deficiency 10/20/2021   Moderate episode of recurrent major depressive disorder (HCC) 08/16/2021   Right knee meniscal tear 08/16/2021   Spider veins of both lower extremities 10/17/2016   Tobacco use disorder 06/06/2013    PCP: Thedora Senior MD   REFERRING PROVIDER: Thedora Senior HERO, MD  REFERRING DIAG: [phone] (ICD-10-CM) - Injury of left quadriceps femoris muscle  THERAPY DIAG:  Chronic pain of left  knee  Pain in left leg  Muscle weakness (generalized)  Difficulty in walking, not elsewhere classified  Unsteadiness on feet  Rationale for Evaluation and Treatment: Rehabilitation  ONSET DATE: May 19th 2025  SUBJECTIVE:   SUBJECTIVE STATEMENT:  Went on a trip to [place] , I was in a compartment on the plane that was not plus size friendly, then went to a show at [employer] [facility] that was not plus size friendly. After all of that I had pain to the point I could not move my leg, saw the MD and he gave me pain pills. In the course of a week I went to express care, MD, ortho and nothing seemed to help. Nothing is making it better. I have just been dealing with it. Ortho yesterday said ligaments feel fine, he is ordering an MRI but this will be the 3rd one. He gave me a cortisone shot yesterday too.   PERTINENT HISTORY: See above in addition to the following from MD notes:  Injury of left quadriceps femoris muscle - Primary   I have reviewed the multiple visits Ms. Forness has had and the multiple x-rays. This and her exam today, suggests more of a quadriceps injury/inflammation. She was given a prescription for an antibiotic, since the MRI report mention the findings could represent cellulitis, but she has no other findings to suggest this. I recommend we switch her to naproxen  500 mg bid for 14 days. I  will provide tramadol  for use at night to try and help her sleep. She can continue a muscle relaxer and the gabapentin  for now. I will refer her to physical therapy to see if we can get this worked out. iw ill see her back in 2 weeks.    Patient is in today with ongoing pain in her left thigh. Ms. Bolds notes she was on a plane flight on 5/13. She felt the seat was small. On 5/15, she attended a show at the [facility]. She also felt that seat was quite small. By 5/17, she was noting pain in her anterior left thigh, which gave her difficulty with standing and ambulating. She missed wok most  days between 5/19-5/27. She went to [employer] on 5/20. Ms. Stachowski maintains that she has not been experiencing back pain. However, the ED note speaks of back pain and radiation to the leg. They performed lumbar spine x-rays. They diagnosed this as a lumbar radiculopathy, gave her a steroid injection and started her on a Medrol  dosepak and provided tramadol . Ms. Windom was seen in Urgent Care on 5/22, as the steroid was not helping her. They provided a Toradol  injection. Ms. Wecker was seen by Dr. Sebastian on 5/23. He ordered a MRI scan of her lumbar spine, referred her to orthopedics, and gave her another Toradol  injection. Ms. Roach was seen at [facility] on 5/24. She had hip and knee x-rays. Gabapentin  was added to her regimen. Ms Grosshans was seen by orthopedics. She notes they added an MRI of the femur to the original MRI request.   Ms. Fulop feels very frustrated that she continues to have left thigh pain. She feels none of the doctors have listened to her and that nothing is being done to resolve her issue. She continues to have pain int he mid anterior thigh. She finds that when she sits too long, this stiffens up. She also notes she has trouble supporting her weight. She missed about 8 days of work in May and is worried about additional absences. She has been trying to stay at work throughout the day. Her managers have allowed her to sit more to accommodate her issue. She is seeking answers to resolve the concern. PAIN:  Are you having pain? Yes: NPRS scale: 9/10 not consistent with non-verbal communication  Pain location: L knee medially   Pain description: burning sensation  Aggravating factors: walking Relieving factors: not a whole lot   PRECAUTIONS: None  RED FLAGS: None   WEIGHT BEARING RESTRICTIONS: No  FALLS:  Has patient fallen in last 6 months? Yes. Number of falls 1- knee gave out   LIVING ENVIRONMENT: Lives with: lives with their spouse Lives in:  House/apartment Stairs: 3STE in front, 1 STE in back  Has following equipment at home: Single point cane  OCCUPATION: works at [employer], mental health tech   PLOF: Independent, Independent with basic ADLs, Independent with gait, and Independent with transfers  PATIENT GOALS:  get knee better   NEXT MD VISIT: Referring in 2 months   OBJECTIVE:  Note: Objective measures were completed at Evaluation unless otherwise noted.  DIAGNOSTIC FINDINGS: CLINICAL DATA:  Traumatic pain to left thigh for 1 week.   EXAM: DG HIP (WITH OR WITHOUT PELVIS) 2-3V LEFT; LEFT KNEE - COMPLETE 4+ VIEW   COMPARISON:  None Available.   FINDINGS: Left knee: There is no evidence of hip fracture or dislocation. There is no joint effusion. Mild degenerative changes are present  in the patellofemoral compartment. The soft tissues are within normal limits.   Left hip: There is no evidence of acute fracture or dislocation. Joint space is maintained at the hips bilaterally. There is sclerosis at the sacroiliac joint on the left, compatible with sacroiliitis. Degenerative changes are noted pubis symphysis.   IMPRESSION: 1. Mild degenerative changes at the left knee with no evidence of acute fracture or dislocation. 2. No acute fracture or dislocation at the left hip. 3. Sclerosis at the left sacroiliac joint, compatible with sacroiliitis.  EXAM DESCRIPTION: MR LUMBAR SPINE WO CONTRAST   CLINICAL HISTORY: Low back pain, spondyloarthropathy suspected, xray done   COMPARISON: None Available.   TECHNIQUE: MRI of the lumbar spine is performed according to our usual protocol with axial and sagittal multi sequence imaging.   FINDINGS: The alignment is unremarkable. Mild degenerative disc disease throughout. No endplate marrow edema. The marrow signal is unremarkable as well as the conus and cauda equina.   L2-3 has mild to moderate foraminal narrowing on the left due to a small  left foraminal protrusion and facet hypertrophy. There is also mild degenerative right-sided foraminal narrowing. There is a very small right paracentral disc extrusion migrating inferiorly seen on series 5 image 18 and series 3 image 7. This causes mild lateral recess narrowing on the right.   L3-4 has a small right foraminal protrusion causing mild foraminal narrowing.   L4-5 has mild bilateral foraminal narrowing due to broad-based disc bulge and facet hypertrophy.   L5-S1 has mild bilateral foraminal narrowing due to broad-based disc bulge and facet hypertrophy.   The imaged levels are otherwise unremarkable.   The imaged retroperitoneal structures are unremarkable.     IMPRESSION: Multilevel degenerative spondylosis with levels described in detail above.   L2-3 has a small left foraminal protrusion contributing to mild-to-moderate foraminal narrowing. There is also a very small right paracentral disc extrusion migrating inferiorly causing mild lateral recess narrowing on the right. No impingement.   L3-4 has a small right foraminal protrusion causing mild foraminal narrowing.  EXAM: DG HIP (WITH OR WITHOUT PELVIS) 2-3V LEFT; LEFT KNEE - COMPLETE 4+ VIEW   COMPARISON:  None Available.   FINDINGS: Left knee: There is no evidence of hip fracture or dislocation. There is no joint effusion. Mild degenerative changes are present in the patellofemoral compartment. The soft tissues are within normal limits.   Left hip: There is no evidence of acute fracture or dislocation. Joint space is maintained at the hips bilaterally. There is sclerosis at the sacroiliac joint on the left, compatible with sacroiliitis. Degenerative changes are noted pubis symphysis.   IMPRESSION: 1. Mild degenerative changes at the left knee with no evidence of acute fracture or dislocation. 2. No acute fracture or dislocation at the left hip. 3. Sclerosis at the left sacroiliac joint, compatible  with sacroiliitis.  Narrative & Impression EXAM DESCRIPTION: MR FEMUR LEFT WO CONTRAST   CLINICAL HISTORY: Pain.   COMPARISON: None Available.   TECHNIQUE: MRI of the femur is performed according to our usual protocol with multiplanar multi sequence imaging.   FINDINGS: No fracture. The marrow signal is unremarkable. The visualized tendons and musculature are unremarkable. Mild focal deep subcutaneous edema overlying the vastus lateralis musculature. No organized fluid collection.   IMPRESSION: Mild focal deep subcutaneous edema abutting the lateral fascia of the vastus lateralis. Correlate for any unreported trauma versus mild focal cellulitis.   The exam is otherwise unremarkable.  PATIENT SURVEYS:   Attempted PSFS, did not  give clear answers   COGNITION: Overall cognitive status: Within functional limits for tasks assessed     SENSATION: Not tested    MUSCLE LENGTH:  HS WNL B  Piriformis mild limitation L, mod R  Mod hip flexor tightness noted due to body habitus     PALPATION:  Tender medial L knee, otherwise not able to provoke pain with palpation   LOWER EXTREMITY ROM:  Active ROM Right eval Left Eval supine   Hip flexion    Hip extension    Hip abduction    Hip adduction    Hip internal rotation    Hip external rotation    Knee flexion  111*  Knee extension  -1* with heel prop   Ankle dorsiflexion    Ankle plantarflexion    Ankle inversion    Ankle eversion     (Blank rows = not tested)  LOWER EXTREMITY MMT:  MMT Right eval Left eval  Hip flexion 4+ 4+  Hip extension    Hip abduction 4 4  Hip adduction    Hip internal rotation    Hip external rotation    Knee flexion 5 4  Knee extension 5 4+  Ankle dorsiflexion    Ankle plantarflexion    Ankle inversion    Ankle eversion     (Blank rows = not tested)    GAIT: Distance walked: in clinic distances  Assistive device utilized: None Level of assistance: Complete  Independence Comments: antalgic                                                                                                                                 TREATMENT DATE:   10/03/23   Eval, POC   PATIENT EDUCATION:  Education details: exam findings, POC, avoiding HEP and general active exercise today due to cortisone shot at ortho yesterday, will give HEP next visit  Person educated: Patient Education method: Explanation and Demonstration Education comprehension: verbalized understanding, returned demonstration, and needs further education  HOME EXERCISE PROGRAM: TBD   ASSESSMENT:  CLINICAL IMPRESSION: Patient is a 53 y.o. F who was seen today for physical therapy evaluation and treatment for S76.102A (ICD-10-CM) - Injury of left quadriceps femoris muscle. Arrived late for eval, also had cortisone shot just yesterday so eval was very limited. Objectives as above. Became emotionally labile mid-eval due to frustration over how long she has been dealing with this pain.  Will attempt to address her functional concerns and pain levels.   OBJECTIVE IMPAIRMENTS: Abnormal gait, decreased activity tolerance, decreased balance, decreased mobility, difficulty walking, decreased strength, increased edema, increased fascial restrictions, obesity, and pain.   ACTIVITY LIMITATIONS: sitting, standing, squatting, sleeping, stairs, transfers, bed mobility, and locomotion level  PARTICIPATION LIMITATIONS: driving, shopping, community activity, and occupation  PERSONAL FACTORS: Behavior pattern, Education, Fitness, Past/current experiences, Social background, and Time since onset of injury/illness/exacerbation are also affecting patient's functional outcome.   REHAB POTENTIAL: Fair chronicity  of pain, body habitus  CLINICAL DECISION MAKING: Stable/uncomplicated  EVALUATION COMPLEXITY: Low   GOALS: Goals reviewed with patient? No  SHORT TERM GOALS: Target date: 10/31/2023   Will be  compliant with appropriate progressive HEP  Baseline: Goal status: INITIAL  2.  Will be compliant with appropriate  edema management strategies  Baseline:  Goal status: INITIAL  3.  L knee/LE pain to be no more than 5/10 Baseline:  Goal status: INITIAL  4.  Will be be able to sit for unlimited periods without increase in pain/stiffness to allow her to perform work duties with more ease  Baseline:  Goal status: INITIAL    LONG TERM GOALS: Target date: 11/28/2023    MMT to be 5/5 in all tested groups  Baseline:  Goal status: INITIAL  2.  L LE/knee pain to be no more than 3/10 Baseline:  Goal status: INITIAL  3.  Will be able to exercise as desired without increase in pain from resting levels  Baseline:  Goal status: INITIAL  4.  No more episodes of L knee buckling with all functional mobility and work/exercise based tasks  Baseline:  Goal status: INITIAL     PLAN:  PT FREQUENCY: 2x/week  PT DURATION: 8 weeks  PLANNED INTERVENTIONS: 97750- Physical Performance Testing, 97110-Therapeutic exercises, 97530- Therapeutic activity, [id number]- Neuromuscular re-education, 97535- Self Care, [id number]- Manual therapy, [id number]- Gait training, [id number]- Orthotic Initial, [id number]- Orthotic/Prosthetic subsequent, [id number]- Aquatic Therapy, [phone]- Vasopneumatic device, and [phone]- Ionotophoresis 4mg /ml Dexamethasone   PLAN FOR NEXT SESSION: knee and hip strengthening with progressive resistance as tolerated, edema management. Could consider ionto down the line if pain doesn't improve, but keep in mind she did have cortisone shot on 10/02/23  Josette Rough, PT, DPT 10/03/23 10:18 AM

## 2023-10-08 ENCOUNTER — Encounter: Payer: Self-pay | Admitting: Family Medicine

## 2023-10-31 ENCOUNTER — Ambulatory Visit: Attending: Family Medicine | Admitting: Physical Therapy

## 2023-10-31 ENCOUNTER — Encounter: Payer: Self-pay | Admitting: Physical Therapy

## 2023-10-31 DIAGNOSIS — M6281 Muscle weakness (generalized): Secondary | ICD-10-CM | POA: Insufficient documentation

## 2023-10-31 DIAGNOSIS — M25562 Pain in left knee: Secondary | ICD-10-CM | POA: Insufficient documentation

## 2023-10-31 DIAGNOSIS — R262 Difficulty in walking, not elsewhere classified: Secondary | ICD-10-CM | POA: Diagnosis not present

## 2023-10-31 DIAGNOSIS — M79605 Pain in left leg: Secondary | ICD-10-CM | POA: Insufficient documentation

## 2023-10-31 DIAGNOSIS — G8929 Other chronic pain: Secondary | ICD-10-CM | POA: Diagnosis not present

## 2023-10-31 NOTE — Therapy (Signed)
 OUTPATIENT PHYSICAL THERAPY LOWER EXTREMITY EVALUATION   Patient Name: Diana Garrison MRN: [id number] DOB:03/02/1971, 53 y.o., female Today's Date: 10/31/2023  END OF SESSION:  PT End of Session - 10/31/23 1013     Visit Number 2    Date for PT Re-Evaluation 11/28/23    PT Start Time 1015    PT Stop Time 1100    PT Time Calculation (min) 45 min    Activity Tolerance Patient tolerated treatment well    Behavior During Therapy [facility] for tasks assessed/performed          Past Medical History:  Diagnosis Date   Depression 08/03/2023   Hyperlipidemia    Phreesia 11/12/2019   Sleep apnea 2023   Past Surgical History:  Procedure Laterality Date   CESAREAN SECTION N/A    Phreesia 11/12/2019   GANGLION CYST EXCISION Bilateral    x2   Patient Active Problem List   Diagnosis Date Noted   Insulin  resistance 09/24/2023   Annual physical exam 09/21/2023   Injury of left quadriceps femoris muscle 09/07/2023   Thickened endometrium 12/14/2022   Postmenopausal vaginal bleeding 11/21/2022   Positional headache 11/03/2022   Decreased libido 09/20/2022   OSA on CPAP 06/27/2022   Polyphagia 05/16/2022   Morbid obesity with BMI of 50.0-59.9, adult (HCC) 01/03/2022   Pre-diabetes 12/05/2021   Depression 11/01/2021   Seasonal allergic rhinitis 10/26/2021   Hyperlipidemia 10/26/2021   Vitamin D  deficiency 10/20/2021   Moderate episode of recurrent major depressive disorder (HCC) 08/16/2021   Right knee meniscal tear 08/16/2021   Spider veins of both lower extremities 10/17/2016   Tobacco use disorder 06/06/2013    PCP: Thedora Senior MD   REFERRING PROVIDER: Thedora Senior HERO, MD  REFERRING DIAG: [phone] (ICD-10-CM) - Injury of left quadriceps femoris muscle  THERAPY DIAG:  Chronic pain of left knee  Pain in left leg  Difficulty in walking, not elsewhere classified  Muscle weakness (generalized)  Rationale for Evaluation and Treatment: Rehabilitation  ONSET DATE:  May 19th 2025  SUBJECTIVE:   SUBJECTIVE STATEMENT:   Doing a lot better, received an injection I her L knee  Went on a trip to [place] , I was in a compartment on the plane that was not plus size friendly, then went to a show at [employer] [facility] that was not plus size friendly. After all of that I had pain to the point I could not move my leg, saw the MD and he gave me pain pills. In the course of a week I went to express care, MD, ortho and nothing seemed to help. Nothing is making it better. I have just been dealing with it. Ortho yesterday said ligaments feel fine, he is ordering an MRI but this will be the 3rd one. He gave me a cortisone shot yesterday too.   PERTINENT HISTORY: See above in addition to the following from MD notes:  Injury of left quadriceps femoris muscle - Primary   I have reviewed the multiple visits Ms. Norrod has had and the multiple x-rays. This and her exam today, suggests more of a quadriceps injury/inflammation. She was given a prescription for an antibiotic, since the MRI report mention the findings could represent cellulitis, but she has no other findings to suggest this. I recommend we switch her to naproxen  500 mg bid for 14 days. I will provide tramadol  for use at night to try and help her sleep. She can continue a muscle relaxer and the gabapentin  for now. I will  refer her to physical therapy to see if we can get this worked out. iw ill see her back in 2 weeks.    Patient is in today with ongoing pain in her left thigh. Ms. Warmack notes she was on a plane flight on 5/13. She felt the seat was small. On 5/15, she attended a show at the [facility]. She also felt that seat was quite small. By 5/17, she was noting pain in her anterior left thigh, which gave her difficulty with standing and ambulating. She missed wok most days between 5/19-5/27. She went to [employer] on 5/20. Ms. Derryberry maintains that she has not been experiencing back pain. However, the ED  note speaks of back pain and radiation to the leg. They performed lumbar spine x-rays. They diagnosed this as a lumbar radiculopathy, gave her a steroid injection and started her on a Medrol  dosepak and provided tramadol . Ms. Pelly was seen in Urgent Care on 5/22, as the steroid was not helping her. They provided a Toradol  injection. Ms. Abrigo was seen by Dr. Sebastian on 5/23. He ordered a MRI scan of her lumbar spine, referred her to orthopedics, and gave her another Toradol  injection. Ms. Stallings was seen at [facility] on 5/24. She had hip and knee x-rays. Gabapentin  was added to her regimen. Ms Boardman was seen by orthopedics. She notes they added an MRI of the femur to the original MRI request.   Ms. Kubisiak feels very frustrated that she continues to have left thigh pain. She feels none of the doctors have listened to her and that nothing is being done to resolve her issue. She continues to have pain int he mid anterior thigh. She finds that when she sits too long, this stiffens up. She also notes she has trouble supporting her weight. She missed about 8 days of work in May and is worried about additional absences. She has been trying to stay at work throughout the day. Her managers have allowed her to sit more to accommodate her issue. She is seeking answers to resolve the concern. PAIN:  Are you having pain? Yes: NPRS scale: 0/10  Pain location: L knee medially   Pain description: burning sensation  Aggravating factors: walking Relieving factors: not a whole lot   PRECAUTIONS: None  RED FLAGS: None   WEIGHT BEARING RESTRICTIONS: No  FALLS:  Has patient fallen in last 6 months? Yes. Number of falls 1- knee gave out   LIVING ENVIRONMENT: Lives with: lives with their spouse Lives in: House/apartment Stairs: 3STE in front, 1 STE in back  Has following equipment at home: Single point cane  OCCUPATION: works at [employer], mental health tech   PLOF: Independent,  Independent with basic ADLs, Independent with gait, and Independent with transfers  PATIENT GOALS:  get knee better   NEXT MD VISIT: Referring in 2 months   OBJECTIVE:  Note: Objective measures were completed at Evaluation unless otherwise noted.  DIAGNOSTIC FINDINGS: CLINICAL DATA:  Traumatic pain to left thigh for 1 week.   EXAM: DG HIP (WITH OR WITHOUT PELVIS) 2-3V LEFT; LEFT KNEE - COMPLETE 4+ VIEW   COMPARISON:  None Available.   FINDINGS: Left knee: There is no evidence of hip fracture or dislocation. There is no joint effusion. Mild degenerative changes are present in the patellofemoral compartment. The soft tissues are within normal limits.   Left hip: There is no evidence of acute fracture or dislocation. Joint space is maintained at the hips  bilaterally. There is sclerosis at the sacroiliac joint on the left, compatible with sacroiliitis. Degenerative changes are noted pubis symphysis.   IMPRESSION: 1. Mild degenerative changes at the left knee with no evidence of acute fracture or dislocation. 2. No acute fracture or dislocation at the left hip. 3. Sclerosis at the left sacroiliac joint, compatible with sacroiliitis.  EXAM DESCRIPTION: MR LUMBAR SPINE WO CONTRAST   CLINICAL HISTORY: Low back pain, spondyloarthropathy suspected, xray done   COMPARISON: None Available.   TECHNIQUE: MRI of the lumbar spine is performed according to our usual protocol with axial and sagittal multi sequence imaging.   FINDINGS: The alignment is unremarkable. Mild degenerative disc disease throughout. No endplate marrow edema. The marrow signal is unremarkable as well as the conus and cauda equina.   L2-3 has mild to moderate foraminal narrowing on the left due to a small left foraminal protrusion and facet hypertrophy. There is also mild degenerative right-sided foraminal narrowing. There is a very small right paracentral disc extrusion migrating inferiorly seen on series 5  image 18 and series 3 image 7. This causes mild lateral recess narrowing on the right.   L3-4 has a small right foraminal protrusion causing mild foraminal narrowing.   L4-5 has mild bilateral foraminal narrowing due to broad-based disc bulge and facet hypertrophy.   L5-S1 has mild bilateral foraminal narrowing due to broad-based disc bulge and facet hypertrophy.   The imaged levels are otherwise unremarkable.   The imaged retroperitoneal structures are unremarkable.     IMPRESSION: Multilevel degenerative spondylosis with levels described in detail above.   L2-3 has a small left foraminal protrusion contributing to mild-to-moderate foraminal narrowing. There is also a very small right paracentral disc extrusion migrating inferiorly causing mild lateral recess narrowing on the right. No impingement.   L3-4 has a small right foraminal protrusion causing mild foraminal narrowing.  EXAM: DG HIP (WITH OR WITHOUT PELVIS) 2-3V LEFT; LEFT KNEE - COMPLETE 4+ VIEW   COMPARISON:  None Available.   FINDINGS: Left knee: There is no evidence of hip fracture or dislocation. There is no joint effusion. Mild degenerative changes are present in the patellofemoral compartment. The soft tissues are within normal limits.   Left hip: There is no evidence of acute fracture or dislocation. Joint space is maintained at the hips bilaterally. There is sclerosis at the sacroiliac joint on the left, compatible with sacroiliitis. Degenerative changes are noted pubis symphysis.   IMPRESSION: 1. Mild degenerative changes at the left knee with no evidence of acute fracture or dislocation. 2. No acute fracture or dislocation at the left hip. 3. Sclerosis at the left sacroiliac joint, compatible with sacroiliitis.  Narrative & Impression EXAM DESCRIPTION: MR FEMUR LEFT WO CONTRAST   CLINICAL HISTORY: Pain.   COMPARISON: None Available.   TECHNIQUE: MRI of the femur is performed according to  our usual protocol with multiplanar multi sequence imaging.   FINDINGS: No fracture. The marrow signal is unremarkable. The visualized tendons and musculature are unremarkable. Mild focal deep subcutaneous edema overlying the vastus lateralis musculature. No organized fluid collection.   IMPRESSION: Mild focal deep subcutaneous edema abutting the lateral fascia of the vastus lateralis. Correlate for any unreported trauma versus mild focal cellulitis.   The exam is otherwise unremarkable.  PATIENT SURVEYS:   Attempted PSFS, did not give clear answers   COGNITION: Overall cognitive status: Within functional limits for tasks assessed     SENSATION: Not tested    MUSCLE LENGTH:  HS WNL B  Piriformis mild limitation L, mod R  Mod hip flexor tightness noted due to body habitus     PALPATION:  Tender medial L knee, otherwise not able to provoke pain with palpation   LOWER EXTREMITY ROM:  Active ROM Right eval Left Eval supine   Hip flexion    Hip extension    Hip abduction    Hip adduction    Hip internal rotation    Hip external rotation    Knee flexion  111*  Knee extension  -1* with heel prop   Ankle dorsiflexion    Ankle plantarflexion    Ankle inversion    Ankle eversion     (Blank rows = not tested)  LOWER EXTREMITY MMT:  MMT Right eval Left eval  Hip flexion 4+ 4+  Hip extension    Hip abduction 4 4  Hip adduction    Hip internal rotation    Hip external rotation    Knee flexion 5 4  Knee extension 5 4+  Ankle dorsiflexion    Ankle plantarflexion    Ankle inversion    Ankle eversion     (Blank rows = not tested)    GAIT: Distance walked: in clinic distances  Assistive device utilized: None Level of assistance: Complete Independence Comments: antalgic                                                                                                                                 TREATMENT DATE:  10/31/23 NuStep L4 x 6 min Sit to  stand 2x10  4in step ups x 10 each  LAQ 3lb 2x10 HS curls blue 2x10  10/03/23   Eval, POC   PATIENT EDUCATION:  Education details: exam findings, POC, avoiding HEP and general active exercise today due to cortisone shot at ortho yesterday, will give HEP next visit  Person educated: Patient Education method: Explanation and Demonstration Education comprehension: verbalized understanding, returned demonstration, and needs further education  HOME EXERCISE PROGRAM: TBD   ASSESSMENT:  CLINICAL IMPRESSION: Patient is a 53 y.o. F who was seen today for physical therapy treatment for S76.102A (ICD-10-CM) - Injury of left quadriceps femoris muscle. She enters doing well reporting the Cortizone shot has helped, not sure if she still needs therapy. Session consisted of an introduction to functional and LE strengthening. Pt became very tired after step ups. Some c/o or R knee pain during session reporting she has a torn meniscus and needed surgery. All interventions completed well.  Will attempt to address her functional concerns.   OBJECTIVE IMPAIRMENTS: Abnormal gait, decreased activity tolerance, decreased balance, decreased mobility, difficulty walking, decreased strength, increased edema, increased fascial restrictions, obesity, and pain.   ACTIVITY LIMITATIONS: sitting, standing, squatting, sleeping, stairs, transfers, bed mobility, and locomotion level  PARTICIPATION LIMITATIONS: driving, shopping, community activity, and occupation  PERSONAL FACTORS: Behavior pattern, Education, Fitness, Past/current experiences, Social background, and Time since onset of injury/illness/exacerbation are also affecting patient's  functional outcome.   REHAB POTENTIAL: Fair chronicity of pain, body habitus  CLINICAL DECISION MAKING: Stable/uncomplicated  EVALUATION COMPLEXITY: Low   GOALS: Goals reviewed with patient? No  SHORT TERM GOALS: Target date: 10/31/2023   Will be compliant with  appropriate progressive HEP  Baseline: Goal status: INITIAL  2.  Will be compliant with appropriate  edema management strategies  Baseline:  Goal status: INITIAL  3.  L knee/LE pain to be no more than 5/10 Baseline:  Goal status: Met 10/31/23  4.  Will be be able to sit for unlimited periods without increase in pain/stiffness to allow her to perform work duties with more ease  Baseline:  Goal status: Progressing 10/31/23    LONG TERM GOALS: Target date: 11/28/2023    MMT to be 5/5 in all tested groups  Baseline:  Goal status: INITIAL  2.  L LE/knee pain to be no more than 3/10 Baseline:  Goal status: INITIAL  3.  Will be able to exercise as desired without increase in pain from resting levels  Baseline:  Goal status: INITIAL  4.  No more episodes of L knee buckling with all functional mobility and work/exercise based tasks  Baseline:  Goal status: INITIAL     PLAN:  PT FREQUENCY: 2x/week  PT DURATION: 8 weeks  PLANNED INTERVENTIONS: 97750- Physical Performance Testing, 97110-Therapeutic exercises, 97530- Therapeutic activity, [id number]- Neuromuscular re-education, 97535- Self Care, [id number]- Manual therapy, [id number]- Gait training, [id number]- Orthotic Initial, [id number]- Orthotic/Prosthetic subsequent, [id number]- Aquatic Therapy, [phone]- Vasopneumatic device, and [phone]- Ionotophoresis 4mg /ml Dexamethasone   PLAN FOR NEXT SESSION: knee and hip strengthening with progressive resistance as tolerated, edema management. Could consider ionto down the line if pain doesn't improve, but keep in mind she did have cortisone shot on 10/02/23  Tanda Sorrow, PTA 10/31/23 10:13 AM

## 2023-11-01 ENCOUNTER — Other Ambulatory Visit (HOSPITAL_COMMUNITY): Payer: Self-pay

## 2023-11-08 ENCOUNTER — Ambulatory Visit: Admitting: Physical Therapy

## 2023-11-08 ENCOUNTER — Encounter: Payer: Self-pay | Admitting: Physical Therapy

## 2023-11-08 DIAGNOSIS — M25562 Pain in left knee: Secondary | ICD-10-CM | POA: Diagnosis not present

## 2023-11-08 DIAGNOSIS — G8929 Other chronic pain: Secondary | ICD-10-CM

## 2023-11-08 DIAGNOSIS — M6281 Muscle weakness (generalized): Secondary | ICD-10-CM | POA: Diagnosis not present

## 2023-11-08 DIAGNOSIS — R262 Difficulty in walking, not elsewhere classified: Secondary | ICD-10-CM | POA: Diagnosis not present

## 2023-11-08 DIAGNOSIS — M79605 Pain in left leg: Secondary | ICD-10-CM

## 2023-11-08 NOTE — Therapy (Signed)
 OUTPATIENT PHYSICAL THERAPY LOWER EXTREMITY TREATMENT   Patient Name: LTANYA BAYLEY MRN: [id number] DOB:Jul 26, 1970, 53 y.o., female Today's Date: 11/08/2023  END OF SESSION:  PT End of Session - 11/08/23 1057     Visit Number 3    Date for PT Re-Evaluation 11/28/23    PT Start Time 1100    PT Stop Time 1145    PT Time Calculation (min) 45 min    Activity Tolerance Patient tolerated treatment well    Behavior During Therapy [facility] for tasks assessed/performed          Past Medical History:  Diagnosis Date   Depression 08/03/2023   Hyperlipidemia    Phreesia 11/12/2019   Sleep apnea 2023   Past Surgical History:  Procedure Laterality Date   CESAREAN SECTION N/A    Phreesia 11/12/2019   GANGLION CYST EXCISION Bilateral    x2   Patient Active Problem List   Diagnosis Date Noted   Insulin  resistance 09/24/2023   Annual physical exam 09/21/2023   Injury of left quadriceps femoris muscle 09/07/2023   Thickened endometrium 12/14/2022   Postmenopausal vaginal bleeding 11/21/2022   Positional headache 11/03/2022   Decreased libido 09/20/2022   OSA on CPAP 06/27/2022   Polyphagia 05/16/2022   Morbid obesity with BMI of 50.0-59.9, adult (HCC) 01/03/2022   Pre-diabetes 12/05/2021   Depression 11/01/2021   Seasonal allergic rhinitis 10/26/2021   Hyperlipidemia 10/26/2021   Vitamin D  deficiency 10/20/2021   Moderate episode of recurrent major depressive disorder (HCC) 08/16/2021   Right knee meniscal tear 08/16/2021   Spider veins of both lower extremities 10/17/2016   Tobacco use disorder 06/06/2013    PCP: Thedora Senior MD   REFERRING PROVIDER: Thedora Senior HERO, MD  REFERRING DIAG: [phone] (ICD-10-CM) - Injury of left quadriceps femoris muscle  THERAPY DIAG:  Chronic pain of left knee  Difficulty in walking, not elsewhere classified  Muscle weakness (generalized)  Pain in left leg  Rationale for Evaluation and Treatment: Rehabilitation  ONSET DATE: May  19th 2025  SUBJECTIVE:   SUBJECTIVE STATEMENT:  Im good R knee has been bothering her. Did a lot of walking for two days  Went on a trip to [place] , I was in a compartment on the plane that was not plus size friendly, then went to a show at [place] that was not plus size friendly. After all of that I had pain to the point I could not move my leg, saw the MD and he gave me pain pills. In the course of a week I went to express care, MD, ortho and nothing seemed to help. Nothing is making it better. I have just been dealing with it. Ortho yesterday said ligaments feel fine, he is ordering an MRI but this will be the 3rd one. He gave me a cortisone shot yesterday too.   PERTINENT HISTORY: See above in addition to the following from MD notes:  Injury of left quadriceps femoris muscle - Primary   I have reviewed the multiple visits Ms. Hutmacher has had and the multiple x-rays. This and her exam today, suggests more of a quadriceps injury/inflammation. She was given a prescription for an antibiotic, since the MRI report mention the findings could represent cellulitis, but she has no other findings to suggest this. I recommend we switch her to naproxen  500 mg bid for 14 days. I will provide tramadol  for use at night to try and help her sleep. She can continue a muscle relaxer and the gabapentin   for now. I will refer her to physical therapy to see if we can get this worked out. iw ill see her back in 2 weeks.    Patient is in today with ongoing pain in her left thigh. Ms. Stringfield notes she was on a plane flight on 5/13. She felt the seat was small. On 5/15, she attended a show at the [facility]. She also felt that seat was quite small. By 5/17, she was noting pain in her anterior left thigh, which gave her difficulty with standing and ambulating. She missed wok most days between 5/19-5/27. She went to [employer] on 5/20. Ms. Freeman maintains that she has not been experiencing back pain.  However, the ED note speaks of back pain and radiation to the leg. They performed lumbar spine x-rays. They diagnosed this as a lumbar radiculopathy, gave her a steroid injection and started her on a Medrol  dosepak and provided tramadol . Ms. Mcdaniel was seen in Urgent Care on 5/22, as the steroid was not helping her. They provided a Toradol  injection. Ms. Leiterman was seen by Dr. Sebastian on 5/23. He ordered a MRI scan of her lumbar spine, referred her to orthopedics, and gave her another Toradol  injection. Ms. Brigandi was seen at [facility] on 5/24. She had hip and knee x-rays. Gabapentin  was added to her regimen. Ms Liptak was seen by orthopedics. She notes they added an MRI of the femur to the original MRI request.   Ms. Yaklin feels very frustrated that she continues to have left thigh pain. She feels none of the doctors have listened to her and that nothing is being done to resolve her issue. She continues to have pain int he mid anterior thigh. She finds that when she sits too long, this stiffens up. She also notes she has trouble supporting her weight. She missed about 8 days of work in May and is worried about additional absences. She has been trying to stay at work throughout the day. Her managers have allowed her to sit more to accommodate her issue. She is seeking answers to resolve the concern. PAIN:  Are you having pain? Yes: NPRS scale: 0/10  Pain location: L knee medially   Pain description: burning sensation  Aggravating factors: walking Relieving factors: not a whole lot   PRECAUTIONS: None  RED FLAGS: None   WEIGHT BEARING RESTRICTIONS: No  FALLS:  Has patient fallen in last 6 months? Yes. Number of falls 1- knee gave out   LIVING ENVIRONMENT: Lives with: lives with their spouse Lives in: House/apartment Stairs: 3STE in front, 1 STE in back  Has following equipment at home: Single point cane  OCCUPATION: works at [employer], mental health tech   PLOF:  Independent, Independent with basic ADLs, Independent with gait, and Independent with transfers  PATIENT GOALS:  get knee better   NEXT MD VISIT: Referring in 2 months   OBJECTIVE:  Note: Objective measures were completed at Evaluation unless otherwise noted.  DIAGNOSTIC FINDINGS: CLINICAL DATA:  Traumatic pain to left thigh for 1 week.   EXAM: DG HIP (WITH OR WITHOUT PELVIS) 2-3V LEFT; LEFT KNEE - COMPLETE 4+ VIEW   COMPARISON:  None Available.   FINDINGS: Left knee: There is no evidence of hip fracture or dislocation. There is no joint effusion. Mild degenerative changes are present in the patellofemoral compartment. The soft tissues are within normal limits.   Left hip: There is no evidence of acute fracture or dislocation. Joint space is  maintained at the hips bilaterally. There is sclerosis at the sacroiliac joint on the left, compatible with sacroiliitis. Degenerative changes are noted pubis symphysis.   IMPRESSION: 1. Mild degenerative changes at the left knee with no evidence of acute fracture or dislocation. 2. No acute fracture or dislocation at the left hip. 3. Sclerosis at the left sacroiliac joint, compatible with sacroiliitis.  EXAM DESCRIPTION: MR LUMBAR SPINE WO CONTRAST   CLINICAL HISTORY: Low back pain, spondyloarthropathy suspected, xray done   COMPARISON: None Available.   TECHNIQUE: MRI of the lumbar spine is performed according to our usual protocol with axial and sagittal multi sequence imaging.   FINDINGS: The alignment is unremarkable. Mild degenerative disc disease throughout. No endplate marrow edema. The marrow signal is unremarkable as well as the conus and cauda equina.   L2-3 has mild to moderate foraminal narrowing on the left due to a small left foraminal protrusion and facet hypertrophy. There is also mild degenerative right-sided foraminal narrowing. There is a very small right paracentral disc extrusion migrating inferiorly seen  on series 5 image 18 and series 3 image 7. This causes mild lateral recess narrowing on the right.   L3-4 has a small right foraminal protrusion causing mild foraminal narrowing.   L4-5 has mild bilateral foraminal narrowing due to broad-based disc bulge and facet hypertrophy.   L5-S1 has mild bilateral foraminal narrowing due to broad-based disc bulge and facet hypertrophy.   The imaged levels are otherwise unremarkable.   The imaged retroperitoneal structures are unremarkable.     IMPRESSION: Multilevel degenerative spondylosis with levels described in detail above.   L2-3 has a small left foraminal protrusion contributing to mild-to-moderate foraminal narrowing. There is also a very small right paracentral disc extrusion migrating inferiorly causing mild lateral recess narrowing on the right. No impingement.   L3-4 has a small right foraminal protrusion causing mild foraminal narrowing.  EXAM: DG HIP (WITH OR WITHOUT PELVIS) 2-3V LEFT; LEFT KNEE - COMPLETE 4+ VIEW   COMPARISON:  None Available.   FINDINGS: Left knee: There is no evidence of hip fracture or dislocation. There is no joint effusion. Mild degenerative changes are present in the patellofemoral compartment. The soft tissues are within normal limits.   Left hip: There is no evidence of acute fracture or dislocation. Joint space is maintained at the hips bilaterally. There is sclerosis at the sacroiliac joint on the left, compatible with sacroiliitis. Degenerative changes are noted pubis symphysis.   IMPRESSION: 1. Mild degenerative changes at the left knee with no evidence of acute fracture or dislocation. 2. No acute fracture or dislocation at the left hip. 3. Sclerosis at the left sacroiliac joint, compatible with sacroiliitis.  Narrative & Impression EXAM DESCRIPTION: MR FEMUR LEFT WO CONTRAST   CLINICAL HISTORY: Pain.   COMPARISON: None Available.   TECHNIQUE: MRI of the femur is performed  according to our usual protocol with multiplanar multi sequence imaging.   FINDINGS: No fracture. The marrow signal is unremarkable. The visualized tendons and musculature are unremarkable. Mild focal deep subcutaneous edema overlying the vastus lateralis musculature. No organized fluid collection.   IMPRESSION: Mild focal deep subcutaneous edema abutting the lateral fascia of the vastus lateralis. Correlate for any unreported trauma versus mild focal cellulitis.   The exam is otherwise unremarkable.  PATIENT SURVEYS:   Attempted PSFS, did not give clear answers   COGNITION: Overall cognitive status: Within functional limits for tasks assessed     SENSATION: Not tested    MUSCLE LENGTH:  HS WNL B  Piriformis mild limitation L, mod R  Mod hip flexor tightness noted due to body habitus     PALPATION:  Tender medial L knee, otherwise not able to provoke pain with palpation   LOWER EXTREMITY ROM:  Active ROM Left Eval supine  Left 11/08/23  Hip flexion    Hip extension    Hip abduction    Hip adduction    Hip internal rotation    Hip external rotation    Knee flexion 111* 110   Knee extension -1* with heel prop  0  Ankle dorsiflexion    Ankle plantarflexion    Ankle inversion    Ankle eversion     (Blank rows = not tested)  LOWER EXTREMITY MMT:  MMT Right eval Left eval Left 11/08/23  Hip flexion 4+ 4+   Hip extension     Hip abduction 4 4   Hip adduction     Hip internal rotation     Hip external rotation     Knee flexion 5 4 5   Knee extension 5 4+ 4+  Ankle dorsiflexion     Ankle plantarflexion     Ankle inversion     Ankle eversion      (Blank rows = not tested)    GAIT: Distance walked: in clinic distances  Assistive device utilized: None Level of assistance: Complete Independence Comments: antalgic                                                                                                                                 TREATMENT  DATE:  11/08/23 NuStep L 5 x 6 min Sit to stand 2x10  Goals 6in forward & Lateral stap ups x10  HS 35lb 2x10 Leg Ext 10lb 2x10  10/31/23 NuStep L4 x 6 min Sit to stand 2x10  4in step ups x 10 each  LAQ 3lb 2x10 HS curls blue 2x10  10/03/23   Eval, POC   PATIENT EDUCATION:  Education details: exam findings, POC, avoiding HEP and general active exercise today due to cortisone shot at ortho yesterday, will give HEP next visit  Person educated: Patient Education method: Explanation and Demonstration Education comprehension: verbalized understanding, returned demonstration, and needs further education  HOME EXERCISE PROGRAM: TBD   ASSESSMENT:  CLINICAL IMPRESSION: Patient is a 53 y.o. F who was seen today for physical therapy treatment for S76.102A (ICD-10-CM) - Injury of left quadriceps femoris muscle. She continues to do well and has progressed towards goals.  Session progressed with strengthening and functional interventions. LE burning with leg extension.   Pt became very tired after step ups.  All interventions completed well.  Will attempt to address her functional concerns.   OBJECTIVE IMPAIRMENTS: Abnormal gait, decreased activity tolerance, decreased balance, decreased mobility, difficulty walking, decreased strength, increased edema, increased fascial restrictions, obesity, and pain.   ACTIVITY LIMITATIONS: sitting, standing, squatting, sleeping, stairs, transfers, bed mobility, and locomotion level  PARTICIPATION LIMITATIONS: driving, shopping, community activity, and occupation  PERSONAL FACTORS: Behavior pattern, Education, Fitness, Past/current experiences, Social background, and Time since onset of injury/illness/exacerbation are also affecting patient's functional outcome.   REHAB POTENTIAL: Fair chronicity of pain, body habitus  CLINICAL DECISION MAKING: Stable/uncomplicated  EVALUATION COMPLEXITY: Low   GOALS: Goals reviewed with patient? No  SHORT TERM  GOALS: Target date: 10/31/2023   Will be compliant with appropriate progressive HEP  Baseline: Goal status: INITIAL  2.  Will be compliant with appropriate  edema management strategies  Baseline:  Goal status: Met 11/08/23  3.  L knee/LE pain to be no more than 5/10 Baseline:  Goal status: Met 10/31/23  4.  Will be be able to sit for unlimited periods without increase in pain/stiffness to allow her to perform work duties with more ease  Baseline:  Goal status: Progressing 10/31/23    LONG TERM GOALS: Target date: 11/28/2023    MMT to be 5/5 in all tested groups  Baseline:  Goal status: INITIAL  2.  L LE/knee pain to be no more than 3/10 Baseline:  Goal status: Met 11/08/23  3.  Will be able to exercise as desired without increase in pain from resting levels  Baseline:  Goal status: INITIAL  4.  No more episodes of L knee buckling with all functional mobility and work/exercise based tasks  Baseline:  Goal status: INITIAL     PLAN:  PT FREQUENCY: 2x/week  PT DURATION: 8 weeks  PLANNED INTERVENTIONS: 97750- Physical Performance Testing, 97110-Therapeutic exercises, 97530- Therapeutic activity, [id number]- Neuromuscular re-education, 97535- Self Care, [id number]- Manual therapy, [id number]- Gait training, [id number]- Orthotic Initial, [id number]- Orthotic/Prosthetic subsequent, [id number]- Aquatic Therapy, [phone]- Vasopneumatic device, and [phone]- Ionotophoresis 4mg /ml Dexamethasone   PLAN FOR NEXT SESSION: knee and hip strengthening with progressive resistance as tolerated, edema management.    Tanda Sorrow, PTA 11/08/23 10:58 AM

## 2023-11-14 ENCOUNTER — Ambulatory Visit: Admitting: Physical Therapy

## 2023-11-22 ENCOUNTER — Ambulatory Visit: Admitting: Physical Therapy

## 2023-12-10 ENCOUNTER — Other Ambulatory Visit: Payer: Self-pay | Admitting: Family Medicine

## 2023-12-10 ENCOUNTER — Other Ambulatory Visit (HOSPITAL_COMMUNITY): Payer: Self-pay

## 2023-12-10 ENCOUNTER — Other Ambulatory Visit: Payer: Self-pay | Admitting: Neurology

## 2023-12-10 DIAGNOSIS — R51 Headache with orthostatic component, not elsewhere classified: Secondary | ICD-10-CM

## 2023-12-10 DIAGNOSIS — G932 Benign intracranial hypertension: Secondary | ICD-10-CM

## 2023-12-10 DIAGNOSIS — M25562 Pain in left knee: Secondary | ICD-10-CM

## 2023-12-10 MED ORDER — NAPROXEN 500 MG PO TABS
500.0000 mg | ORAL_TABLET | Freq: Two times a day (BID) | ORAL | 0 refills | Status: AC
  Filled 2023-12-10: qty 28, 14d supply, fill #0

## 2023-12-11 ENCOUNTER — Other Ambulatory Visit (HOSPITAL_COMMUNITY): Payer: Self-pay

## 2023-12-11 ENCOUNTER — Other Ambulatory Visit: Payer: Self-pay

## 2023-12-11 MED ORDER — TOPIRAMATE 25 MG PO TABS
75.0000 mg | ORAL_TABLET | Freq: Every day | ORAL | 3 refills | Status: AC
  Filled 2023-12-11: qty 90, 30d supply, fill #0
  Filled 2024-01-14: qty 90, 30d supply, fill #1

## 2024-01-14 ENCOUNTER — Other Ambulatory Visit: Payer: Self-pay | Admitting: Neurology

## 2024-01-14 ENCOUNTER — Other Ambulatory Visit: Payer: Self-pay | Admitting: Family Medicine

## 2024-01-14 ENCOUNTER — Other Ambulatory Visit (HOSPITAL_COMMUNITY): Payer: Self-pay

## 2024-01-14 DIAGNOSIS — M25562 Pain in left knee: Secondary | ICD-10-CM

## 2024-01-14 DIAGNOSIS — M79605 Pain in left leg: Secondary | ICD-10-CM

## 2024-01-14 MED ORDER — GABAPENTIN 100 MG PO CAPS
100.0000 mg | ORAL_CAPSULE | Freq: Three times a day (TID) | ORAL | 0 refills | Status: AC
  Filled 2024-01-14: qty 60, 20d supply, fill #0

## 2024-01-18 ENCOUNTER — Other Ambulatory Visit (HOSPITAL_COMMUNITY): Payer: Self-pay

## 2024-01-24 ENCOUNTER — Other Ambulatory Visit (HOSPITAL_COMMUNITY): Payer: Self-pay

## 2024-01-24 DIAGNOSIS — M1711 Unilateral primary osteoarthritis, right knee: Secondary | ICD-10-CM | POA: Diagnosis not present

## 2024-01-24 MED ORDER — CELECOXIB 100 MG PO CAPS
ORAL_CAPSULE | ORAL | 1 refills | Status: AC
Start: 2024-01-24 — End: ?
  Filled 2024-01-24: qty 60, 30d supply, fill #0
  Filled 2024-02-21: qty 60, 30d supply, fill #1

## 2024-02-08 ENCOUNTER — Other Ambulatory Visit (HOSPITAL_COMMUNITY): Payer: Self-pay

## 2024-02-08 ENCOUNTER — Encounter (HOSPITAL_COMMUNITY): Payer: Self-pay

## 2024-02-13 NOTE — Progress Notes (Deleted)
 NEUROLOGY FOLLOW UP OFFICE NOTE  Diana Garrison [id number]  Subjective:  Diana Garrison is a 53 y.o. year old right handed female with a history of OSA, obesity, pre-DM, HLD, vit D deficiency, depression who we last saw on 08/16/23 for ***.  To briefly review: 12/20/22: Patient's headaches occur only at night. When she sits up, she thinks it slowly improves. She wakes up from sleep. She does not think she has any visual symptoms. Patient is taking ibuprofen , alleve, tylenol . She is taking it every 2 hours during the night. Symptoms typically last 30 minutes and is typically is located on the front of her head. She rates the pain as 10/10. It feels like an intense pressure, like someone is stepping on her head. She feels like blood is pulsating. She denies photophobia, phonophobia, nausea, vomiting. She has the headache 2-3 times per week. She does not have symptoms during the day. She denies any significant neck pain.   About 20 years ago she had headaches and double vision. She saw a neurologist and had a lumbar puncture with high volume tap. After this, her visual issue was rapidly corrected. Due to concern for IIH, patient was sent to ophtho. Patient was seen at [facility] on 12/12/22 and had dilated fundoscopy that reports normal optic disc bilaterally (no papilledema).   Patient has OSA and is supposed to be on CPAP. She has not been using it for the last few months though. She only feels tired in the morning when she has had a headache during the night. She rarely naps, only on off days, but does not get headaches at that time.   She is currently on robaxin  and mobic  for knee pain.    Birth control? Patient takes progesterone for heavy bleeding/pre-menopause Smoker? 6 cigarettes per day EtOH use: 2 glasses of wine per week Restrictive diet: No. She denies any recent weight changes Caffeine use: None currently  I started Topamax  50 mg daily on 12/20/22.  08/16/23: Patient's headaches  had not improved, so Topamax  was increased to 75 mg daily on 01/16/23. Her headaches have greatly improved. She is not really having a headache now. She is still on Topamax  75 mg daily.   She has not seen her sleep medicine doctor but states she will.   Patient has new left thigh pain. It started after a plane trip. It is located on the top of the left thigh. She saw her PCP who gave her muscle relaxer. She was not able to lift her leg on 07/31/23 so she went to the ED. She has having a lot of burning and a cramp sensation. Xrays showed evidence of left sacroilitis and bone spurs in lumbar spine. She was given a prednisone  shot and prednisone  taper. Neither of these helped. She went to urgent care who gave her toradol . This did not help. She saw ortho who also said symptoms were coming from her back. She was given gabapentin  300 mg TID. She is able to walk better now, but she still has spasms. She feels no one is giving her anything for pain. She is having an MRI of her lumbar spine on 08/19/23.    Pain was worse with walking but with no associated numbness or tingling.  Most recent Assessment and Plan (08/16/23): This is Diana Garrison, a 53 y.o. female with: Nocturnal headaches - improved with Topamax  75 mg daily. Symptoms may be due to IIH with contributions from untreated OSA. Patient does not want LP.  She also has not seen sleep medicine yet, as I recommended. She said she will though.  Left thigh pain - getting work up through ortho and PCP, appears to be back related though patient is not sure. I do not see clear deficits outside of pain on examination today.   Plan: -Continue topamax  75 mg daily -Increase gabapentin  to 400 mg three times daily -MRI lumbar spine as planned -See sleep medicine for untreated OSA  Since their last visit: ***  MEDICATIONS:  Outpatient Encounter Medications as of 02/21/2024  Medication Sig   acetaminophen  (TYLENOL ) 500 MG tablet Take 1 tablet (500 mg total)  by mouth every 6 (six) hours as needed.   celecoxib  (CELEBREX ) 100 MG capsule Take 1 capsule every 12 hours by oral route with meal(s).   doxycycline  (VIBRA -TABS) 100 MG tablet Take 1 tablet (100 mg total) by mouth every 12 (twelve) hours.   gabapentin  (NEURONTIN ) 100 MG capsule Take 1 capsule (100 mg total) by mouth 3 (three) times daily.   gabapentin  (NEURONTIN ) 300 MG capsule Take 1 capsule (300 mg total) by mouth 3 (three) times daily.   magnesium  30 MG tablet Take 1 tablet (30 mg total) by mouth 2 (two) times daily.   medroxyPROGESTERone  (PROVERA ) 5 MG tablet Take 1 tablet (5 mg total) by mouth daily.   methocarbamol  (ROBAXIN ) 500 MG tablet Take 1 tablet (500 mg total) by mouth 4 (four) times daily.   Multiple Vitamin (MULTIVITAMIN) tablet Take 1 tablet by mouth daily.   naproxen  (NAPROSYN ) 500 MG tablet Take 1 tablet (500 mg total) by mouth 2 (two) times daily with a meal.   rosuvastatin  (CRESTOR ) 20 MG tablet Take 1 tablet (20 mg total) by mouth daily.   Semaglutide -Weight Management (WEGOVY ) 0.25 MG/0.5ML SOAJ Inject 0.25 mg into the skin once a week.   Semaglutide -Weight Management (WEGOVY ) 0.5 MG/0.5ML SOAJ Inject 0.5 mg into the skin once a week. Start after 4 weeks on the 0.25 mg weekly dose.   topiramate  (TOPAMAX ) 25 MG tablet Take 3 tablets (75 mg total) by mouth daily.   Vitamin D , Ergocalciferol , (DRISDOL ) 1.25 MG (50000 UNIT) CAPS capsule Take 1 capsule (50,000 Units total) by mouth every 7 (seven) days.   zinc  gluconate 50 MG tablet Take 1 tablet (50 mg total) by mouth daily.   No facility-administered encounter medications on file as of 02/21/2024.    PAST MEDICAL HISTORY: Past Medical History:  Diagnosis Date   Depression 08/03/2023   Hyperlipidemia    [employer]rees[employer] 11/12/2019   Sleep apnea 2023    PAST SURGICAL HISTORY: Past Surgical History:  Procedure Laterality Date   CESAREAN SECTION N/A    [employer]ia 11/12/2019   GANGLION CYST EXCISION Bilateral    x2     ALLERGIES: Allergies  Allergen Reactions   Percocet [Oxycodone -Acetaminophen ] Other (See Comments)    Hallucinations, paranoia    FAMILY HISTORY: Family History  Problem Relation Age of Onset   Obesity Father    Cirrhosis Father    Pulmonary embolism Sister    Lupus Brother    Breast cancer Neg Hx     SOCIAL HISTORY: Social History   Tobacco Use   Smoking status: Every Day    Current packs/day: 0.25    Average packs/day: 0.3 packs/day for 24.2 years (8.0 ttl pk-yrs)    Types: Cigarettes    Start date: 10/10/2007    Passive exposure: Never   Smokeless tobacco: Never   Tobacco comments:    6 cigs per day 04/14/21.  Vaping Use   Vaping status: Never Used  Substance Use Topics   Alcohol use: Yes    Alcohol/week: 2.0 standard drinks of alcohol    Types: 2 Glasses of wine per week    Comment: 2 drinks per week.   Drug use: Not Currently   Social History   Social History Narrative   Are you right handed or left handed? Right   Are you currently employed ?    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   Who lives with you? husband   What type of home do you live in: 1 story or 2 story? one   Caffeine 1 time a week        Objective:  Vital Signs:  There were no vitals taken for this visit.  ***  Labs and Imaging review: New results: 09/21/23: HbA1c: 6.4 Vit D: low at 17.95 Lipid panel: tChol 172, LDL 101, TG 155.0  MRI lumbar spine wo contrast (08/19/23): FINDINGS: The alignment is unremarkable. Mild degenerative disc disease throughout. No endplate marrow edema. The marrow signal is unremarkable as well as the conus and cauda equina.   L2-3 has mild to moderate foraminal narrowing on the left due to a small left foraminal protrusion and facet hypertrophy. There is also mild degenerative right-sided foraminal narrowing. There is a very small right paracentral disc extrusion migrating inferiorly seen on series 5  image 18 and series 3 image 7. This causes mild lateral recess narrowing on the right.   L3-4 has a small right foraminal protrusion causing mild foraminal narrowing.   L4-5 has mild bilateral foraminal narrowing due to broad-based disc bulge and facet hypertrophy.   L5-S1 has mild bilateral foraminal narrowing due to broad-based disc bulge and facet hypertrophy.   The imaged levels are otherwise unremarkable.   The imaged retroperitoneal structures are unremarkable.     IMPRESSION: Multilevel degenerative spondylosis with levels described in detail above.   L2-3 has a small left foraminal protrusion contributing to mild-to-moderate foraminal narrowing. There is also a very small right paracentral disc extrusion migrating inferiorly causing mild lateral recess narrowing on the right. No impingement.   L3-4 has a small right foraminal protrusion causing mild foraminal narrowing.  MRI left femur (08/31/23): IMPRESSION: Mild focal deep subcutaneous edema abutting the lateral fascia of the vastus lateralis. Correlate for any unreported trauma versus mild focal cellulitis.   The exam is otherwise unremarkable.  Previously reviewed results: 08/04/23: BMP unremarkable CBC w/ diff significant for WBC 15.1 (neutrophilic predominance) CK: 154            Lab Results  Component Value Date    HGBA1C 6.1 (H) 09/04/2022      Recent Labs           Lab Results  Component Value Date    VITAMINB12 1,000 10/18/2021      Recent Labs[] Expand by Default           Lab Results  Component Value Date    TSH 1.04 11/21/2022      11/21/22: CMP significant for glucose of 119 CBC w/ diff significant for MCV of 96.9   Vit D (11/03/22): 31 (wnl)   Ferritin (10/18/21): 34   Imaging: MRI lumbar spine wo contrast (08/11/2013): FINDINGS:  Last fully open disk space is labeled L5-S1. Present examination  incorporates from T11-12 disc space through the lower sacrum.   Conus just below the  T12-L1 disc space.  Right ovary 2.4 x 1.8 cm cyst/follicle may be present although  incompletely assessed.   T11-12 through L1-2 unremarkable.   L2-3:  Minimal facet joint degenerative changes.   L3-4: Minimal right-sided and mild left-sided facet joint  degenerative changes.   L4-5:  Minimal facet joint degenerative changes.   L5-S1:  Minimal facet joint degenerative changes.   Decreased signal intensity of bone marrow may be related to  patient's habitus. Correlation with CBC to exclude anemia  contributing to this appearance may be considered   IMPRESSION:  No evidence of lumbar disc herniation, spinal stenosis or foraminal  narrowing.   Minimal to mild facet joint degenerative changes most notable on the  left at the L3-4 level.   Decreased signal intensity of bone marrow may be related to  patient's habitus. Correlation with CBC to exclude anemia  contributing to this appearance may be considered.    EMG (04/08/19): NCV & EMG Findings: 2 electrodiagnostic testing of the left upper extremity shows:  Left median sensory response shows prolonged latency (6.5 ms) and reduced amplitude (7.4 V). Left median motor response shows prolonged latency (5.9 ms).  Left ulnar motor responses within normal limits. There is no evidence of active or chronic motor axonal loss changes affecting any of the tested muscles.  Motor unit configuration and recruitment pattern is within normal limits.   Impression: Left median neuropathy at or distal to the wrist (moderate-to-severe) consistent with a clinical diagnosis of carpal tunnel syndrome   Sleep study result Date of study: 05/11/2021  Impression: Moderately severe obstructive sleep apnea Moderate oxygen desaturations  Recommendation: DME referral  Recommend CPAP therapy for moderate obstructive sleep apnea  Auto titrating CPAP with pressure settings of 5-20 will be appropriate  Encourage weight loss measures   Lumbar spine  xray (07/31/23): FINDINGS: No fracture or dislocation. Normal alignment. Mild narrowing of interspaces L2-S1 with small anterior endplate spurs.   IMPRESSION: 1. No acute findings. 2. Mild multilevel degenerative disc disease.   Hip xray (08/04/23): IMPRESSION: 1. Mild degenerative changes at the left knee with no evidence of acute fracture or dislocation. 2. No acute fracture or dislocation at the left hip. 3. Sclerosis at the left sacroiliac joint, compatible with sacroiliitis.  Assessment/Plan:  This is Diana Garrison, a 53 y.o. female with: ***   Plan: ***  Return to clinic in ***  Total time spent reviewing records, interview, history/exam, documentation, and coordination of care on day of encounter:  *** min  Venetia Potters, MD

## 2024-02-21 ENCOUNTER — Encounter: Payer: Self-pay | Admitting: Neurology

## 2024-02-21 ENCOUNTER — Other Ambulatory Visit (HOSPITAL_COMMUNITY): Payer: Self-pay

## 2024-02-21 ENCOUNTER — Other Ambulatory Visit: Payer: Self-pay

## 2024-02-21 ENCOUNTER — Ambulatory Visit: Admitting: Neurology

## 2024-02-25 ENCOUNTER — Other Ambulatory Visit (HOSPITAL_COMMUNITY): Payer: Self-pay

## 2024-02-25 ENCOUNTER — Telehealth: Admitting: Family Medicine

## 2024-02-25 DIAGNOSIS — J019 Acute sinusitis, unspecified: Secondary | ICD-10-CM

## 2024-02-25 DIAGNOSIS — B9689 Other specified bacterial agents as the cause of diseases classified elsewhere: Secondary | ICD-10-CM | POA: Diagnosis not present

## 2024-02-25 MED ORDER — AMOXICILLIN-POT CLAVULANATE 875-125 MG PO TABS
1.0000 | ORAL_TABLET | Freq: Two times a day (BID) | ORAL | 0 refills | Status: AC
  Filled 2024-02-25 (×2): qty 14, 7d supply, fill #0

## 2024-02-25 MED ORDER — BENZONATATE 100 MG PO CAPS
100.0000 mg | ORAL_CAPSULE | Freq: Three times a day (TID) | ORAL | 0 refills | Status: DC | PRN
  Filled 2024-02-25 (×2): qty 30, 5d supply, fill #0

## 2024-02-25 NOTE — Progress Notes (Signed)
 Virtual Visit Consent   Diana Garrison, you are scheduled for a virtual visit with a [facility] provider today. Just as with appointments in the office, your consent must be obtained to participate. Your consent will be active for this visit and any virtual visit you may have with one of our providers in the next 365 days. If you have a MyChart account, a copy of this consent can be sent to you electronically.  As this is a virtual visit, video technology does not allow for your provider to perform a traditional examination. This may limit your provider's ability to fully assess your condition. If your provider identifies any concerns that need to be evaluated in person or the need to arrange testing (such as labs, EKG, etc.), we will make arrangements to do so. Although advances in technology are sophisticated, we cannot ensure that it will always work on either your end or our end. If the connection with a video visit is poor, the visit may have to be switched to a telephone visit. With either a video or telephone visit, we are not always able to ensure that we have a secure connection.  By engaging in this virtual visit, you consent to the provision of healthcare and authorize for your insurance to be billed (if applicable) for the services provided during this visit. Depending on your insurance coverage, you may receive a charge related to this service.  I need to obtain your verbal consent now. Are you willing to proceed with your visit today? Diana Garrison has provided verbal consent on 02/25/2024 for a virtual visit (video or telephone). Chiquita CHRISTELLA Barefoot, NP  Date: 02/25/2024 11:45 AM   Virtual Visit via Video Note   I, Chiquita CHRISTELLA Barefoot, connected with  DARBIE BIANCARDI  ([id number], [birth year]) on 02/25/2024 at 11:45 AM EST by a video-enabled telemedicine application and verified that I am speaking with the correct person using two identifiers.  Location: Patient: Virtual Visit  Location Patient: Home Provider: Virtual Visit Location Provider: Home Office   I discussed the limitations of evaluation and management by telemedicine and the availability of in person appointments. The patient expressed understanding and agreed to proceed.    History of Present Illness: MARAYAH HIGDON is a 53 y.o. who identifies as a female who was assigned female at birth, and is being seen today for congestion  Onset was 6 days ago after recent travel- congestion and scratchy throat  Associated symptoms are cough with some mucus- yellow green It has greatly worsen over the last several days Modifying factors are sudafed, day and ny quil Denies chest pain, shortness of breath, fevers, chills  Exposure to sick contacts- unknown COVID test: neg  Vaccines: none   Problems:  Patient Active Problem List   Diagnosis Date Noted   Insulin  resistance 09/24/2023   Annual physical exam 09/21/2023   Injury of left quadriceps femoris muscle 09/07/2023   Thickened endometrium 12/14/2022   Postmenopausal vaginal bleeding 11/21/2022   Positional headache 11/03/2022   Decreased libido 09/20/2022   OSA on CPAP 06/27/2022   Polyphagia 05/16/2022   Morbid obesity with BMI of 50.0-59.9, adult (HCC) 01/03/2022   Pre-diabetes 12/05/2021   Depression 11/01/2021   Seasonal allergic rhinitis 10/26/2021   Hyperlipidemia 10/26/2021   Vitamin D  deficiency 10/20/2021   Moderate episode of recurrent major depressive disorder (HCC) 08/16/2021   Right knee meniscal tear 08/16/2021   Spider veins of both lower extremities 10/17/2016   Tobacco use disorder  06/06/2013    Allergies: Allergies[1] Medications: Current Medications[2]  Observations/Objective: Patient is well-developed, well-nourished in no acute distress.  Resting comfortably  at home.  Head is normocephalic, atraumatic.  No labored breathing.  Speech is clear and coherent with logical content.  Patient is alert and oriented at  baseline.  Cough    Assessment and Plan:  1. Acute bacterial sinusitis (Primary)  - amoxicillin -clavulanate (AUGMENTIN ) 875-125 MG tablet; Take 1 tablet by mouth 2 (two) times daily for 7 days.  Dispense: 14 tablet; Refill: 0 - benzonatate  (TESSALON ) 100 MG capsule; Take 1-2 capsules (100-200 mg total) by mouth 3 (three) times daily as needed for cough.  Dispense: 30 capsule; Refill: 0  - Increased rest - Increasing Fluids - Acetaminophen  / ibuprofen  as needed for fever/pain.  - Saline nasal spray if congestion or if nasal passages feel dry. - Humidifying the air.     Reviewed side effects, risks and benefits of medication.    Patient acknowledged agreement and understanding of the plan.   Past Medical, Surgical, Social History, Allergies, and Medications have been Reviewed.      Follow Up Instructions: I discussed the assessment and treatment plan with the patient. The patient was provided an opportunity to ask questions and all were answered. The patient agreed with the plan and demonstrated an understanding of the instructions.  A copy of instructions were sent to the patient via MyChart unless otherwise noted below.    The patient was advised to call back or seek an in-person evaluation if the symptoms worsen or if the condition fails to improve as anticipated.    Chiquita CHRISTELLA Barefoot, NP     [1]  Allergies Allergen Reactions   Percocet [Oxycodone -Acetaminophen ] Other (See Comments)    Hallucinations, paranoia  [2]  Current Outpatient Medications:    acetaminophen  (TYLENOL ) 500 MG tablet, Take 1 tablet (500 mg total) by mouth every 6 (six) hours as needed., Disp: 30 tablet, Rfl: 0   celecoxib  (CELEBREX ) 100 MG capsule, Take 1 capsule by mouth every 12 hours with meals, Disp: 60 capsule, Rfl: 1   doxycycline  (VIBRA -TABS) 100 MG tablet, Take 1 tablet (100 mg total) by mouth every 12 (twelve) hours., Disp: 10 tablet, Rfl: 0   gabapentin  (NEURONTIN ) 100 MG capsule, Take 1  capsule (100 mg total) by mouth 3 (three) times daily., Disp: 60 capsule, Rfl: 0   gabapentin  (NEURONTIN ) 300 MG capsule, Take 1 capsule (300 mg total) by mouth 3 (three) times daily., Disp: 90 capsule, Rfl: 0   magnesium  30 MG tablet, Take 1 tablet (30 mg total) by mouth 2 (two) times daily., Disp: 60 tablet, Rfl: 0   medroxyPROGESTERone  (PROVERA ) 5 MG tablet, Take 1 tablet (5 mg total) by mouth daily., Disp: 30 tablet, Rfl: 2   methocarbamol  (ROBAXIN ) 500 MG tablet, Take 1 tablet (500 mg total) by mouth 4 (four) times daily., Disp: 60 tablet, Rfl: 1   Multiple Vitamin (MULTIVITAMIN) tablet, Take 1 tablet by mouth daily., Disp: , Rfl:    naproxen  (NAPROSYN ) 500 MG tablet, Take 1 tablet (500 mg total) by mouth 2 (two) times daily with a meal., Disp: 28 tablet, Rfl: 0   rosuvastatin  (CRESTOR ) 20 MG tablet, Take 1 tablet (20 mg total) by mouth daily., Disp: 90 tablet, Rfl: 3   Semaglutide -Weight Management (WEGOVY ) 0.25 MG/0.5ML SOAJ, Inject 0.25 mg into the skin once a week., Disp: 2 mL, Rfl: 0   Semaglutide -Weight Management (WEGOVY ) 0.5 MG/0.5ML SOAJ, Inject 0.5 mg into the skin once  a week. Start after 4 weeks on the 0.25 mg weekly dose., Disp: 2 mL, Rfl: 0   topiramate  (TOPAMAX ) 25 MG tablet, Take 3 tablets (75 mg total) by mouth daily., Disp: 90 tablet, Rfl: 3   Vitamin D , Ergocalciferol , (DRISDOL ) 1.25 MG (50000 UNIT) CAPS capsule, Take 1 capsule (50,000 Units total) by mouth every 7 (seven) days., Disp: 12 capsule, Rfl: 0   zinc  gluconate 50 MG tablet, Take 1 tablet (50 mg total) by mouth daily., Disp: 30 tablet, Rfl: 0

## 2024-02-25 NOTE — Patient Instructions (Addendum)
 Diana Garrison, thank you for joining Chiquita CHRISTELLA Barefoot, NP for today's virtual visit.  While this provider is not your primary care provider (PCP), if your PCP is located in our provider database this encounter information will be shared with them immediately following your visit.   A [facility] MyChart account gives you access to today's visit and all your visits, tests, and labs performed at [facility]  click here if you don't have a [facility] MyChart account or go to [email or web address]  Consent: (Patient) Diana Garrison provided verbal consent for this virtual visit at the beginning of the encounter.  Current Medications:  Current Outpatient Medications:    amoxicillin -clavulanate (AUGMENTIN ) 875-125 MG tablet, Take 1 tablet by mouth 2 (two) times daily for 7 days., Disp: 14 tablet, Rfl: 0   benzonatate  (TESSALON ) 100 MG capsule, Take 1-2 capsules (100-200 mg total) by mouth 3 (three) times daily as needed for cough., Disp: 30 capsule, Rfl: 0   acetaminophen  (TYLENOL ) 500 MG tablet, Take 1 tablet (500 mg total) by mouth every 6 (six) hours as needed., Disp: 30 tablet, Rfl: 0   celecoxib  (CELEBREX ) 100 MG capsule, Take 1 capsule by mouth every 12 hours with meals, Disp: 60 capsule, Rfl: 1   doxycycline  (VIBRA -TABS) 100 MG tablet, Take 1 tablet (100 mg total) by mouth every 12 (twelve) hours., Disp: 10 tablet, Rfl: 0   gabapentin  (NEURONTIN ) 100 MG capsule, Take 1 capsule (100 mg total) by mouth 3 (three) times daily., Disp: 60 capsule, Rfl: 0   gabapentin  (NEURONTIN ) 300 MG capsule, Take 1 capsule (300 mg total) by mouth 3 (three) times daily., Disp: 90 capsule, Rfl: 0   magnesium  30 MG tablet, Take 1 tablet (30 mg total) by mouth 2 (two) times daily., Disp: 60 tablet, Rfl: 0   medroxyPROGESTERone  (PROVERA ) 5 MG tablet, Take 1 tablet (5 mg total) by mouth daily., Disp: 30 tablet, Rfl: 2   methocarbamol  (ROBAXIN ) 500 MG tablet, Take 1 tablet (500 mg total) by mouth  4 (four) times daily., Disp: 60 tablet, Rfl: 1   Multiple Vitamin (MULTIVITAMIN) tablet, Take 1 tablet by mouth daily., Disp: , Rfl:    naproxen  (NAPROSYN ) 500 MG tablet, Take 1 tablet (500 mg total) by mouth 2 (two) times daily with a meal., Disp: 28 tablet, Rfl: 0   rosuvastatin  (CRESTOR ) 20 MG tablet, Take 1 tablet (20 mg total) by mouth daily., Disp: 90 tablet, Rfl: 3   Semaglutide -Weight Management (WEGOVY ) 0.25 MG/0.5ML SOAJ, Inject 0.25 mg into the skin once a week., Disp: 2 mL, Rfl: 0   Semaglutide -Weight Management (WEGOVY ) 0.5 MG/0.5ML SOAJ, Inject 0.5 mg into the skin once a week. Start after 4 weeks on the 0.25 mg weekly dose., Disp: 2 mL, Rfl: 0   topiramate  (TOPAMAX ) 25 MG tablet, Take 3 tablets (75 mg total) by mouth daily., Disp: 90 tablet, Rfl: 3   Vitamin D , Ergocalciferol , (DRISDOL ) 1.25 MG (50000 UNIT) CAPS capsule, Take 1 capsule (50,000 Units total) by mouth every 7 (seven) days., Disp: 12 capsule, Rfl: 0   zinc  gluconate 50 MG tablet, Take 1 tablet (50 mg total) by mouth daily., Disp: 30 tablet, Rfl: 0   Medications ordered in this encounter:  Meds ordered this encounter  Medications   amoxicillin -clavulanate (AUGMENTIN ) 875-125 MG tablet    Sig: Take 1 tablet by mouth 2 (two) times daily for 7 days.    Dispense:  14 tablet    Refill:  0    Supervising Provider:  LAMPTEY, PHILIP O [[id number]]   benzonatate  (TESSALON ) 100 MG capsule    Sig: Take 1-2 capsules (100-200 mg total) by mouth 3 (three) times daily as needed for cough.    Dispense:  30 capsule    Refill:  0    Supervising Provider:   BLAISE ALEENE KIDD [[id number]]     *If you need refills on other medications prior to your next appointment, please contact your pharmacy*  Follow-Up: Call back or seek an in-person evaluation if the symptoms worsen or if the condition fails to improve as anticipated.  [facility] [phone]  Other Instructions  - Increased rest - Increasing Fluids -  Acetaminophen  / ibuprofen  as needed for fever/pain.  - Saline nasal spray if congestion or if nasal passages feel dry. - Humidifying the air.     If you have been instructed to have an in-person evaluation today at a local Urgent Care facility, please use the link below. It will take you to a list of all of our available [facility], including address, phone number and hours of operation. Please do not delay care.  [facility]  If you or a family member do not have a primary care provider, use the link below to schedule a visit and establish care. When you choose a [facility] primary care physician or advanced practice provider, you gain a long-term partner in health. Find a Primary Care Provider  Learn more about [facility]'s in-office and virtual care options: [facility] Get Care Now

## 2024-03-10 ENCOUNTER — Other Ambulatory Visit (HOSPITAL_COMMUNITY): Payer: Self-pay

## 2024-03-10 DIAGNOSIS — Z01419 Encounter for gynecological examination (general) (routine) without abnormal findings: Secondary | ICD-10-CM | POA: Diagnosis not present

## 2024-03-10 DIAGNOSIS — G4700 Insomnia, unspecified: Secondary | ICD-10-CM | POA: Diagnosis not present

## 2024-03-10 DIAGNOSIS — R9389 Abnormal findings on diagnostic imaging of other specified body structures: Secondary | ICD-10-CM | POA: Diagnosis not present

## 2024-03-10 DIAGNOSIS — Z6841 Body Mass Index (BMI) 40.0 and over, adult: Secondary | ICD-10-CM | POA: Diagnosis not present

## 2024-03-10 DIAGNOSIS — N950 Postmenopausal bleeding: Secondary | ICD-10-CM | POA: Diagnosis not present

## 2024-03-10 DIAGNOSIS — R030 Elevated blood-pressure reading, without diagnosis of hypertension: Secondary | ICD-10-CM | POA: Diagnosis not present

## 2024-03-10 MED ORDER — PROGESTERONE 200 MG PO CAPS
200.0000 mg | ORAL_CAPSULE | Freq: Every day | ORAL | 1 refills | Status: AC
  Filled 2024-03-10 – 2024-03-12 (×2): qty 90, 90d supply, fill #0

## 2024-03-11 ENCOUNTER — Other Ambulatory Visit: Payer: Self-pay

## 2024-03-12 ENCOUNTER — Other Ambulatory Visit: Payer: Self-pay

## 2024-03-12 ENCOUNTER — Other Ambulatory Visit (HOSPITAL_COMMUNITY): Payer: Self-pay

## 2024-03-31 ENCOUNTER — Telehealth: Admitting: Physician Assistant

## 2024-03-31 DIAGNOSIS — R519 Headache, unspecified: Secondary | ICD-10-CM

## 2024-03-31 NOTE — Progress Notes (Signed)
" °  Because of your headache, I feel your condition warrants further evaluation and I recommend that you be seen in a face-to-face visit.   NOTE: There will be NO CHARGE for this E-Visit   If you are having a true medical emergency, please call 911.     For an urgent face to face visit,  has multiple urgent care centers for your convenience.  Click the link below for the full list of locations and hours, walk-in wait times, appointment scheduling options and driving directions:  Urgent Care - [place], [place], [place], [place], [place], [place]       Your MyChart E-visit questionnaire answers were reviewed by a board certified advanced clinical practitioner to complete your personal care plan based on your specific symptoms.    Thank you for using e-Visits.      "

## 2024-04-10 ENCOUNTER — Other Ambulatory Visit (HOSPITAL_COMMUNITY): Payer: Self-pay

## 2024-04-10 ENCOUNTER — Encounter: Payer: Self-pay | Admitting: Family Medicine

## 2024-04-10 MED ORDER — ZEPBOUND 2.5 MG/0.5ML ~~LOC~~ SOLN: 2.5000 mg | SUBCUTANEOUS | 0 refills | Status: AC

## 2024-04-10 MED ORDER — ZEPBOUND 5 MG/0.5ML ~~LOC~~ SOLN: 5.0000 mg | SUBCUTANEOUS | 1 refills | Status: AC

## 2024-04-12 ENCOUNTER — Other Ambulatory Visit (HOSPITAL_COMMUNITY): Payer: Self-pay

## 2024-04-12 MED ORDER — MEDROXYPROGESTERONE ACETATE 5 MG PO TABS
ORAL_TABLET | ORAL | 1 refills | Status: AC
  Filled 2024-04-12: qty 30, 30d supply, fill #0

## 2024-04-12 MED ORDER — TRAZODONE HCL 50 MG PO TABS
ORAL_TABLET | ORAL | 2 refills | Status: AC
  Filled 2024-04-12: qty 30, 30d supply, fill #0
